# Patient Record
Sex: Female | Born: 1975
Health system: Southern US, Community
[De-identification: ages and names within clinical notes are randomized; demographics above are authoritative.]

## PROBLEM LIST (undated history)

## (undated) DIAGNOSIS — O99345 Other mental disorders complicating the puerperium: Secondary | ICD-10-CM

## (undated) DIAGNOSIS — H9191 Unspecified hearing loss, right ear: Secondary | ICD-10-CM

## (undated) DIAGNOSIS — R200 Anesthesia of skin: Secondary | ICD-10-CM

## (undated) DIAGNOSIS — O139 Gestational [pregnancy-induced] hypertension without significant proteinuria, unspecified trimester: Secondary | ICD-10-CM

## (undated) DIAGNOSIS — R87619 Unspecified abnormal cytological findings in specimens from cervix uteri: Secondary | ICD-10-CM

## (undated) DIAGNOSIS — Q999 Chromosomal abnormality, unspecified: Secondary | ICD-10-CM

## (undated) DIAGNOSIS — K812 Acute cholecystitis with chronic cholecystitis: Secondary | ICD-10-CM

## (undated) DIAGNOSIS — F53 Postpartum depression: Secondary | ICD-10-CM

## (undated) DIAGNOSIS — IMO0002 Reserved for concepts with insufficient information to code with codable children: Secondary | ICD-10-CM

## (undated) DIAGNOSIS — O3440 Maternal care for other abnormalities of cervix, unspecified trimester: Secondary | ICD-10-CM

## (undated) DIAGNOSIS — G039 Meningitis, unspecified: Secondary | ICD-10-CM

## (undated) DIAGNOSIS — Z9889 Other specified postprocedural states: Secondary | ICD-10-CM

## (undated) HISTORY — DX: Postpartum depression: F53.0

## (undated) HISTORY — DX: Unspecified hearing loss, right ear: H91.91

## (undated) HISTORY — PX: WISDOM TOOTH EXTRACTION: SHX21

## (undated) HISTORY — PX: CHOLECYSTECTOMY: SHX55

## (undated) HISTORY — DX: Other mental disorders complicating the puerperium: O99.345

## (undated) HISTORY — PX: DILATION AND CURETTAGE OF UTERUS: SHX78

---
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
## 2004-07-30 DIAGNOSIS — O3440 Maternal care for other abnormalities of cervix, unspecified trimester: Secondary | ICD-10-CM

## 2004-07-30 HISTORY — PX: CERVICAL BIOPSY  W/ LOOP ELECTRODE EXCISION: SUR135

## 2004-07-30 HISTORY — DX: Maternal care for other abnormalities of cervix, unspecified trimester: O34.40

## 2004-09-27 HISTORY — PX: COLPOSCOPY: SHX161

## 2007-09-10 ENCOUNTER — Ambulatory Visit (HOSPITAL_COMMUNITY): Admission: RE | Admit: 2007-09-10 | Discharge: 2007-09-10 | Payer: Self-pay | Admitting: Radiology

## 2007-09-10 ENCOUNTER — Encounter (INDEPENDENT_AMBULATORY_CARE_PROVIDER_SITE_OTHER): Payer: Self-pay | Admitting: Obstetrics and Gynecology

## 2008-02-28 ENCOUNTER — Encounter (INDEPENDENT_AMBULATORY_CARE_PROVIDER_SITE_OTHER): Payer: Self-pay | Admitting: Obstetrics and Gynecology

## 2008-02-28 ENCOUNTER — Ambulatory Visit (HOSPITAL_COMMUNITY): Admission: RE | Admit: 2008-02-28 | Discharge: 2008-02-28 | Payer: Self-pay | Admitting: Obstetrics and Gynecology

## 2009-01-13 ENCOUNTER — Inpatient Hospital Stay (HOSPITAL_COMMUNITY): Admission: AD | Admit: 2009-01-13 | Discharge: 2009-01-15 | Payer: Self-pay | Admitting: Obstetrics and Gynecology

## 2010-07-30 NOTE — L&D Delivery Note (Signed)
Delivery of 32 week stillborn  Patient was C/C/+2 and pushed for 2 minutes with epidural.   Breech NSVD  female infant, Apgars 0,0, weight P.   The patient had no lacerations. Fundus was firm. EBL was expected. Placenta was delivered intact. Vagina was clear.  Baby was transferred to warmer.  Phenotypic female, possible low set ears.  No other abnormalities seen.  Manan Olmo A

## 2010-08-24 ENCOUNTER — Ambulatory Visit (HOSPITAL_COMMUNITY)
Admission: RE | Admit: 2010-08-24 | Discharge: 2010-08-24 | Payer: Self-pay | Source: Home / Self Care | Attending: Obstetrics and Gynecology | Admitting: Obstetrics and Gynecology

## 2010-08-24 LAB — CBC
HCT: 35.6 % — ABNORMAL LOW (ref 36.0–46.0)
Hemoglobin: 12.5 g/dL (ref 12.0–15.0)
MCHC: 35.1 g/dL (ref 30.0–36.0)

## 2010-09-02 NOTE — Op Note (Signed)
  Teresa Wong, Teresa Wong                 ACCOUNT NO.:  [id number]  MEDICAL RECORD NO.:  [id number]          PATIENT TYPE:  AMB  LOCATION:  [place]                           FACILITY:  [facility]  PHYSICIAN:  Bobbye Charleston, M.D. DATE OF BIRTH:  10-07-1975  DATE OF PROCEDURE:  08/24/2010 DATE OF DISCHARGE:  08/24/2010                              OPERATIVE REPORT   PREOPERATIVE DIAGNOSIS:  Missed abortion.  POSTOPERATIVE DIAGNOSIS:  Missed abortion.  PROCEDURES:  Dilation and evacuation.  SURGEON:  Bobbye Charleston, M.D.  ASSISTANT:  None.  ANESTHESIA:  MAC anesthesia.  FINDINGS:  8-week size uterus down to 6 weeks with good crie.  SPECIMENS:  Uterine contents to Pathology.  BLOOD LOSS:  Minimal.  IV FLUIDS:  1000 mL.  URINE OUTPUT:  Not measured.  COMPLICATIONS:  None.  MEDICATIONS:  Methergine.  COUNTS:  Correct x3.  The patient's blood type was record at the office as O positive.  TECHNIQUE:  After adequate MAC anesthesia was achieved, the patient was prepped and draped in usual sterile fashion in dorsal lithotomy position.  Bladder was emptied with a red rubber catheters, speculum was placed in the vagina.  The single-tooth tenaculum was used to grasp the cervix, and the cervix was dilated with Kennon Rounds dilators.  An 8-mm curette was then passed into the uterine cavity and all uterine contents were suctioned out.  The sharp curettage indicated good crie.  The patient was given an IM shot of Methergine and all instruments were withdrawn from the vagina except the speculum.  There was a small amount of bleeding from the tenaculum site which was taken care of with silver nitrate.  The patient tolerated the procedure well and was returned to the recovery room in stable condition after the speculum had been removed.     Bobbye Charleston, M.D.     MH/MEDQ  D:  08/24/2010  T:  08/25/2010  Job:  [id number]  Electronically Signed by Bobbye Charleston MD on 09/02/2010 03:44:26  PM

## 2010-11-06 LAB — COMPREHENSIVE METABOLIC PANEL
ALT: 19 U/L (ref 0–35)
Albumin: 2.7 g/dL — ABNORMAL LOW (ref 3.5–5.2)
Alkaline Phosphatase: 98 U/L (ref 39–117)
BUN: 2 mg/dL — ABNORMAL LOW (ref 6–23)
BUN: 6 mg/dL (ref 6–23)
CO2: 20 mEq/L (ref 19–32)
Calcium: 8.9 mg/dL (ref 8.4–10.5)
Chloride: 104 mEq/L (ref 96–112)
Creatinine, Ser: 0.53 mg/dL (ref 0.4–1.2)
GFR calc Af Amer: >60 mL/min (ref >60)
GFR calc non Af Amer: >60 mL/min (ref >60)
Glucose, Bld: 81 mg/dL (ref 70–99)
Glucose, Bld: 85 mg/dL (ref 70–99)
Potassium: 3.8 mEq/L (ref 3.5–5.1)
Sodium: 137 mEq/L (ref 135–145)
Total Bilirubin: 0.5 mg/dL (ref 0.3–1.2)

## 2010-11-06 LAB — CBC
HCT: 32.9 % — ABNORMAL LOW (ref 36.0–46.0)
Hemoglobin: 11.7 g/dL — ABNORMAL LOW (ref 12.0–15.0)
Hemoglobin: 14.3 g/dL (ref 12.0–15.0)
MCHC: 35.1 g/dL (ref 30.0–36.0)
MCV: 93.5 fL (ref 78.0–100.0)
Platelets: 184 10*3/uL (ref 150–400)
RBC: 4.37 MIL/uL (ref 3.87–5.11)
WBC: 10.7 10*3/uL — ABNORMAL HIGH (ref 4.0–10.5)
WBC: 12.5 10*3/uL — ABNORMAL HIGH (ref 4.0–10.5)

## 2010-11-06 LAB — RPR: RPR Ser Ql: NONREACTIVE

## 2010-11-06 LAB — LACTATE DEHYDROGENASE: LDH: 150 U/L (ref 94–250)

## 2010-11-06 LAB — URIC ACID: Uric Acid, Serum: 3.6 mg/dL (ref 2.4–7.0)

## 2010-12-12 NOTE — Discharge Summary (Signed)
Teresa Wong, Teresa Wong                 ACCOUNT NO.:  [id number]   MEDICAL RECORD NO.:  [id number]          PATIENT TYPE:  INP   LOCATION:  9145                          FACILITY:  [facility]   PHYSICIAN:  Cristopher Estimable. Stann Mainland, M.D.   DATE OF BIRTH:  1976-06-09   DATE OF ADMISSION:  01/13/2009  DATE OF DISCHARGE:  01/15/2009                               DISCHARGE SUMMARY   DISCHARGE DIAGNOSES:  1. Term pregnancy delivered 5 pound 12 ounce female infant, Apgars 8      and 9.  2. Blood type O+.  3. Mild preeclampsia.   PROCEDURES:  1. Vacuum extraction-assisted delivery.  2. Second-degree midline laceration repair.   SUMMARY:  This 35 year old gravida 3, now para 1, aborta 2 was admitted  early active labor at [redacted] weeks gestation.  She was felt to have mild  preeclampsia.  She progressed and on the morning of June 17 underwent  amniotomy with production of clear fluid at that time.  She was 7 cm  dilated.  Her blood pressure was stable and she subsequently early in  the afternoon on June 17 underwent a vacuum extraction-assisted delivery  with delivery of a 5 pound 12 ounce female infant with Apgars of 8 and 9  of her second-degree tear which was repaired without difficulty.  On the  first postpartum day, she was normotensive and her labs were normal.  On  the morning of June 19 getting ready for discharge.  Her blood pressure  was noted to be 149/94.   Labs were repeated which included a CBC and a comprehensive metabolic  profile and they were normal.  She was given 200 mg labetalol p.o. and  an hour and half later her blood pressure was 124/84.   She was discharged to home with appropriate instructions.   MEDICATIONS:  At time of discharge include, labetalol 200 mg twice daily  for a total of 7 days, she will use Advil 2 every 4 hours as needed for  cramping and mild discomfort, and she was given a prescription for Tylox  which she will use 1-2 every 4-6 hours as needed for more severe pain.  She will return to the office and follow up in approximately 4 weeks'  time or as needed.  She was given an instruction sheet at the time of  discharge and understood all instructions well.   CONDITION ON DISCHARGE:  Improved.      Cristopher Estimable. Stann Mainland, M.D.  Electronically Signed     RMW/MEDQ  D:  01/15/2009  T:  01/15/2009  Job:  [id number]

## 2010-12-12 NOTE — Op Note (Signed)
Teresa Wong, Teresa Wong                 ACCOUNT NO.:  [id number]   MEDICAL RECORD NO.:  [id number]          PATIENT TYPE:  AMB   LOCATION:  [place]                           FACILITY:  [facility]   PHYSICIAN:  Bobbye Charleston, M.D. DATE OF BIRTH:  12/26/1975   DATE OF PROCEDURE:  02/28/2008  DATE OF DISCHARGE:                               OPERATIVE REPORT   PREOPERATIVE DIAGNOSIS:  Missed abortion.   POSTOPERATIVE DIAGNOSIS:  Missed abortion.   PROCEDURES:  Dilation and evacuation.   SURGEON:  Bobbye Charleston, MD.   ASSISTANT:  None.   ANESTHESIA:  MAC.   FINDINGS:  A 9-week size down to 7-week size post operation with good  crie.   SPECIMENS:  Uterine contents to pathology.   BLOOD LOSS:  100 mL.   IV FLUIDS:  1000 mL.   URINE OUTPUT:  Not measured.   MEDICATIONS:  Methergine.   Counts were correct x3.   TECHNIQUE:  After adequate MAC anesthesia was achieved, she was prepped  and draped in usual sterile fashion in dorsal lithotomy position.  The  bladder was emptied with a red rubber catheters.  A speculum was placed  in the vagina.  Cervix was grasped with a single-tooth tenaculum and the  cervix dilated with Teresa Wong dilators.  A 9 mm suction curette was passed  into the uterus and suction curettage was performed.  Alternating  suction curettage and sharp curettage was performed to ensure removal of  all contents.  Once good crie was obtained, all instruments drawn from  the vagina and the patient was returned to recovery room in stable  condition.     Bobbye Charleston, M.D.  Electronically Signed    MH/MEDQ  D:  02/28/2008  T:  02/28/2008  Job:  [id number]

## 2010-12-15 NOTE — Op Note (Signed)
NAMEMURLE, HELLSTROM                 ACCOUNT NO.:  [id number]   MEDICAL RECORD NO.:  [id number]          PATIENT TYPE:  AMB   LOCATION:  [place]                           FACILITY:  [facility]   PHYSICIAN:  Bobbye Charleston, M.D. DATE OF BIRTH:  [birth year]   DATE OF PROCEDURE:  09/11/2007  DATE OF DISCHARGE:                               OPERATIVE REPORT   PREOPERATIVE DIAGNOSIS:  Missed abortion.   POSTOPERATIVE DIAGNOSIS:  Missed abortion.   PROCEDURE:  Dilation and evacuation.   SURGEON:  Dr. Philis Pique.   ASSISTANT:  None.   ANESTHESIA:  MAC.   FINDINGS:  A 9-10 week size uterus down to 7 week post procedure with  good crie.   SPECIMENS:  Uterine contents to pathology.   ESTIMATED BLOOD LOSS:  100 mL.   IV FLUIDS:  800 mL.   URINE OUTPUT:  Not measured.   COMPLICATIONS:  None.   COUNTS:  Correct x3.   MEDICATIONS:  Methergine.   TECHNIQUE:  After adequate MAC anesthesia was achieved, the patient was  prepped and draped in the usual sterile fashion in the dorsal lithotomy  position.  The bladder was emptied with a red rubber catheter and  speculum placed in the vagina. The cervix was grasped with a single-  tooth tenaculum and the cervix dilated with Kennon Rounds dilators.  A 10 mm suction curette was placed inside the uterus and suction  curettage performed. Alternating suction curettage with sharp curettage  was performed until good crie was obtained. A dose of Methergine was  given. All instruments withdrawn from the vagina.  The patient returned  to the recovery room in stable condition.      Bobbye Charleston, M.D.  Electronically Signed     MH/MEDQ  D:  09/10/2007  T:  09/11/2007  Job:  [id number]

## 2011-04-20 LAB — ABO/RH: ABO/RH(D): O POS

## 2011-04-20 LAB — CBC
MCHC: 35.3
Platelets: 352
RBC: 4.34
WBC: 7.2

## 2011-04-25 ENCOUNTER — Encounter (HOSPITAL_COMMUNITY): Payer: Self-pay | Admitting: Anesthesiology

## 2011-04-25 ENCOUNTER — Encounter (HOSPITAL_COMMUNITY): Payer: Self-pay | Admitting: *Deleted

## 2011-04-25 ENCOUNTER — Inpatient Hospital Stay (HOSPITAL_COMMUNITY): Payer: 59 | Admitting: Anesthesiology

## 2011-04-25 ENCOUNTER — Encounter (HOSPITAL_COMMUNITY): Payer: Self-pay

## 2011-04-25 ENCOUNTER — Inpatient Hospital Stay (HOSPITAL_COMMUNITY)
Admission: AD | Admit: 2011-04-25 | Discharge: 2011-04-26 | DRG: 775 | Disposition: A | Discharge status: Home / Self Care | Payer: 59 | Source: Ambulatory Visit | Attending: Obstetrics and Gynecology | Admitting: Obstetrics and Gynecology

## 2011-04-25 DIAGNOSIS — O321XX0 Maternal care for breech presentation, not applicable or unspecified: Secondary | ICD-10-CM | POA: Diagnosis present

## 2011-04-25 DIAGNOSIS — O364XX0 Maternal care for intrauterine death, not applicable or unspecified: Principal | ICD-10-CM | POA: Diagnosis present

## 2011-04-25 HISTORY — DX: Gestational (pregnancy-induced) hypertension without significant proteinuria, unspecified trimester: O13.9

## 2011-04-25 HISTORY — DX: Reserved for concepts with insufficient information to code with codable children: IMO0002

## 2011-04-25 HISTORY — DX: Other specified postprocedural states: Z98.890

## 2011-04-25 HISTORY — DX: Unspecified abnormal cytological findings in specimens from cervix uteri: R87.619

## 2011-04-25 HISTORY — DX: Meningitis, unspecified: G03.9

## 2011-04-25 HISTORY — DX: Maternal care for other abnormalities of cervix, unspecified trimester: O34.40

## 2011-04-25 LAB — URIC ACID: Uric Acid, Serum: 3.6 mg/dL (ref 2.4–7.0)

## 2011-04-25 LAB — RPR
RPR: NONREACTIVE
RPR: NONREACTIVE

## 2011-04-25 LAB — CBC
HCT: 39.3 % (ref 36.0–46.0)
Hemoglobin: 13.9 g/dL (ref 12.0–15.0)
MCV: 89.9 fL (ref 78.0–100.0)
RBC: 4.37 MIL/uL (ref 3.87–5.11)
WBC: 8.9 10*3/uL (ref 4.0–10.5)

## 2011-04-25 LAB — COMPREHENSIVE METABOLIC PANEL
ALT: 10 U/L (ref 0–35)
AST: 12 U/L (ref 0–37)
Albumin: 2.8 g/dL — ABNORMAL LOW (ref 3.5–5.2)
CO2: 24 mEq/L (ref 19–32)
Calcium: 9 mg/dL (ref 8.4–10.5)
Chloride: 103 mEq/L (ref 96–112)
Creatinine, Ser: 0.51 mg/dL (ref 0.50–1.10)
GFR calc non Af Amer: >60 mL/min (ref >60)
Sodium: 137 mEq/L (ref 135–145)
Total Bilirubin: 0.2 mg/dL — ABNORMAL LOW (ref 0.3–1.2)

## 2011-04-25 LAB — HEPATITIS B SURFACE ANTIGEN: Hepatitis B Surface Ag: NEGATIVE

## 2011-04-25 LAB — HIV ANTIBODY (ROUTINE TESTING W REFLEX): HIV: NONREACTIVE

## 2011-04-25 LAB — LACTIC ACID, PLASMA: Lactic Acid, Venous: 1.4 mmol/L (ref 0.5–2.2)

## 2011-04-25 MED ORDER — PHENYLEPHRINE 40 MCG/ML (10ML) SYRINGE FOR IV PUSH (FOR BLOOD PRESSURE SUPPORT)
80.0000 ug | PREFILLED_SYRINGE | INTRAVENOUS | Status: DC | PRN
  Filled 2011-04-25: qty 5

## 2011-04-25 MED ORDER — EPHEDRINE 5 MG/ML INJ: 10.0000 mg | INTRAVENOUS | Status: DC | PRN

## 2011-04-25 MED ORDER — DIPHENHYDRAMINE HCL 50 MG/ML IJ SOLN: 12.5000 mg | INTRAMUSCULAR | Status: DC | PRN

## 2011-04-25 MED ORDER — OXYTOCIN 20 UNITS IN LACTATED RINGERS INFUSION - SIMPLE
125.0000 mL/h | Freq: Once | INTRAVENOUS | Status: DC
  Filled 2011-04-25: qty 1000

## 2011-04-25 MED ORDER — CITRIC ACID-SODIUM CITRATE 334-500 MG/5ML PO SOLN: 30.0000 mL | ORAL | Status: DC | PRN

## 2011-04-25 MED ORDER — OXYTOCIN BOLUS FROM INFUSION
500.0000 mL | Freq: Once | INTRAVENOUS | Status: DC
  Filled 2011-04-25: qty 1000
  Filled 2011-04-25: qty 500

## 2011-04-25 MED ORDER — PHENYLEPHRINE 40 MCG/ML (10ML) SYRINGE FOR IV PUSH (FOR BLOOD PRESSURE SUPPORT): 80.0000 ug | PREFILLED_SYRINGE | INTRAVENOUS | Status: DC | PRN

## 2011-04-25 MED ORDER — LIDOCAINE HCL (PF) 1 % IJ SOLN
30.0000 mL | INTRAMUSCULAR | Status: DC | PRN
  Filled 2011-04-25: qty 30

## 2011-04-25 MED ORDER — EPHEDRINE 5 MG/ML INJ
10.0000 mg | INTRAVENOUS | Status: DC | PRN
  Filled 2011-04-25: qty 4

## 2011-04-25 MED ORDER — LIDOCAINE HCL 1.5 % IJ SOLN: INTRAMUSCULAR | Status: DC | PRN

## 2011-04-25 MED ORDER — MISOPROSTOL 25 MCG QUARTER TABLET
25.0000 ug | ORAL_TABLET | ORAL | Status: DC | PRN
  Filled 2011-04-25: qty 0.25

## 2011-04-25 MED ORDER — LACTATED RINGERS IV SOLN: 500.0000 mL | Freq: Once | INTRAVENOUS | Status: DC

## 2011-04-25 MED ORDER — ONDANSETRON HCL 4 MG/2ML IJ SOLN: 4.0000 mg | Freq: Four times a day (QID) | INTRAMUSCULAR | Status: DC | PRN

## 2011-04-25 MED ORDER — IBUPROFEN 600 MG PO TABS
600.0000 mg | ORAL_TABLET | Freq: Four times a day (QID) | ORAL | Status: DC | PRN
  Administered 2011-04-26: 600 mg via ORAL
  Filled 2011-04-25: qty 1

## 2011-04-25 MED ORDER — ACETAMINOPHEN 325 MG PO TABS: 650.0000 mg | ORAL_TABLET | ORAL | Status: DC | PRN

## 2011-04-25 MED ORDER — LIDOCAINE HCL 1.5 % IJ SOLN
INTRAMUSCULAR | Status: DC | PRN
  Administered 2011-04-25 (×2): 5 mL via EPIDURAL

## 2011-04-25 MED ORDER — FENTANYL 2.5 MCG/ML BUPIVACAINE 1/10 % EPIDURAL INFUSION (WH - ANES)
14.0000 mL/h | INTRAMUSCULAR | Status: DC
  Administered 2011-04-25 – 2011-04-26 (×4): 14 mL/h via EPIDURAL
  Filled 2011-04-25 (×6): qty 60

## 2011-04-25 MED ORDER — BUTORPHANOL TARTRATE 2 MG/ML IJ SOLN: 1.0000 mg | INTRAMUSCULAR | Status: DC | PRN

## 2011-04-25 MED ORDER — FENTANYL 2.5 MCG/ML BUPIVACAINE 1/10 % EPIDURAL INFUSION (WH - ANES)
INTRAMUSCULAR | Status: DC | PRN
  Administered 2011-04-25: 14 mL/h via EPIDURAL

## 2011-04-25 MED ORDER — LACTATED RINGERS IV SOLN: 500.0000 mL | INTRAVENOUS | Status: DC | PRN

## 2011-04-25 MED ORDER — LACTATED RINGERS IV SOLN
INTRAVENOUS | Status: DC
  Administered 2011-04-25 – 2011-04-26 (×3): via INTRAVENOUS

## 2011-04-25 MED ORDER — OXYCODONE-ACETAMINOPHEN 5-325 MG PO TABS: 2.0000 | ORAL_TABLET | ORAL | Status: DC | PRN

## 2011-04-25 MED ORDER — OXYTOCIN 20 UNITS IN LACTATED RINGERS INFUSION - SIMPLE
1.0000 m[IU]/min | INTRAVENOUS | Status: DC
  Administered 2011-04-25: 2 m[IU]/min via INTRAVENOUS
  Administered 2011-04-25: 16 m[IU]/min via INTRAVENOUS

## 2011-04-25 MED ORDER — FLEET ENEMA 7-19 GM/118ML RE ENEM: 1.0000 | ENEMA | RECTAL | Status: DC | PRN

## 2011-04-25 NOTE — Anesthesia Preprocedure Evaluation (Signed)
Anesthesia Evaluation  Name, MR# and DOB Patient awake  General Assessment Comment  Reviewed: Allergy & Precautions, H&P , NPO status , Patient's Chart, lab work & pertinent test results  Airway Mallampati: II TM Distance: >3 FB Neck ROM: full    Dental No notable dental hx.    Pulmonary  clear to auscultation  pulmonary exam normalPulmonary Exam Normal breath sounds clear to auscultation none    Cardiovascular     Neuro/Psych Negative Neurological ROS  Negative Psych ROS  GI/Hepatic/Renal negative GI ROS  negative Liver ROS  negative Renal ROS        Endo/Other  Negative Endocrine ROS (+)      Abdominal Normal abdominal exam  (+)   Musculoskeletal   Hematology negative hematology ROS (+)   Peds  Reproductive/Obstetrics (+) Pregnancy    Anesthesia Other Findings             Anesthesia Physical Anesthesia Plan  ASA: II  Anesthesia Plan: Epidural   Post-op Pain Management:    Induction:   Airway Management Planned:   Additional Equipment:   Intra-op Plan:   Post-operative Plan:   Informed Consent: I have reviewed the patients History and Physical, chart, labs and discussed the procedure including the risks, benefits and alternatives for the proposed anesthesia with the patient or authorized representative who has indicated his/her understanding and acceptance.     Plan Discussed with:   Anesthesia Plan Comments:         Anesthesia Quick Evaluation

## 2011-04-25 NOTE — Anesthesia Procedure Notes (Signed)
Epidural Patient location during procedure: OB Start time: 04/25/2011 11:25 AM End time: 04/25/2011 11:31 AM Reason for block: procedure for pain  Staffing Anesthesiologist: Laymond Purser Performed by: anesthesiologist   Preanesthetic Checklist Completed: patient identified, site marked, surgical consent, pre-op evaluation, timeout performed, IV checked, risks and benefits discussed and monitors and equipment checked  Epidural Patient position: sitting Prep: site prepped and draped and DuraPrep Patient monitoring: continuous pulse ox and blood pressure Approach: midline Injection technique: LOR air  Needle:  Needle type: Tuohy  Needle gauge: 17 G Needle length: 9 cm Needle insertion depth: 6 cm Catheter type: closed end flexible Catheter size: 19 Gauge Catheter at skin depth: 11 cm Test dose: negative and 1.5% lidocaine  Assessment Sensory level: T10 Events: blood not aspirated, injection not painful, no injection resistance, negative IV test and no paresthesia

## 2011-04-25 NOTE — Progress Notes (Signed)
Spiritual Care - Visited with patient to offer support and to relay concern from her co-workers at [employer] - she was very touched by their concern.  Her father was with her and was very pleasant and providing good support.  I explained availability of chaplaincy services as needed.  Adella Hare, Chaplain

## 2011-04-25 NOTE — Plan of Care (Signed)
Continued support and providing information about intrauterine loss. Pt verbalizes understanding and has no questions at this time. Has decided against autopsy.

## 2011-04-25 NOTE — Progress Notes (Signed)
Discussing with patient comfort care, autopsy options, and after delivery preferences. Pt tearful and held by husband.

## 2011-04-25 NOTE — H&P (Signed)
35 y.o.34 y.o. found to have a a 32 week demise with hydrops.  Unknown reason.  Pt had been followed closely for [facility] for the last 3 weeks.  [place] labs about 2 weeks ago had been normal and she never had proteinuria.      Past Medical History  Diagnosis Date  . Abnormal Pap smear   . History of LEEP (loop electrosurgical excision procedure) of cervix complicating pregnancy 7915  . Pregnancy induced hypertension   . Meningitis 1984    Past Surgical History  Procedure Date  . Dilation and curettage of uterus     OB History    Grav Para Term Preterm Abortions TAB SAB Ect Mult Living   5 1 1  0 3 0 3 0 0 1     # Outc Date GA Lbr Len/2nd Wgt Sex Del Anes PTL Lv   1 TRM            2 SAB            3 SAB            4 SAB            5 CUR               History   Social History  . Marital Status: Married    Spouse Name: N/A    Number of Children: N/A  . Years of Education: N/A   Occupational History  . Not on file.   Social History Main Topics  . Smoking status: Not on file  . Smokeless tobacco: Not on file  . Alcohol Use: No  . Drug Use: No  . Sexually Active: No   Other Topics Concern  . Not on file   Social History Narrative  . No narrative on file   Review of patient's allergies indicates no known allergies.   Prenatal Course:  Uncomplicated except for PIH and now stillbirth.  No chromosomal prenatal testing was done.    Filed Vitals:   04/25/11 1201  BP: 131/85  Pulse: 111  Temp:   Resp: 18     Lungs/Cor:  NAD Abdomen:  soft, gravid Ex:  no cords, erythema SVE:  2/60/-2, breech (confirmed by u/s) FHTs:  none Toco:  none  Lab Results  Component Value Date   WBC 8.9 04/25/2011   HGB 13.9 04/25/2011   HCT 39.3 04/25/2011   MCV 89.9 04/25/2011   PLT 252 04/25/2011     A/P   32 weeks still birth.  All options and risks d/w patient.  Desires to proceed with induction and chromosomal testing.  Declines autopsy at this point.   Sayeed Weatherall A

## 2011-04-26 ENCOUNTER — Other Ambulatory Visit: Payer: Self-pay | Admitting: Obstetrics and Gynecology

## 2011-04-26 ENCOUNTER — Encounter (HOSPITAL_COMMUNITY): Payer: Self-pay | Admitting: *Deleted

## 2011-04-26 LAB — RUBELLA ANTIBODY, IGM: Rubella: IMMUNE

## 2011-04-26 MED ORDER — LORAZEPAM 0.5 MG PO TABS: 0.5000 mg | ORAL_TABLET | Freq: Three times a day (TID) | ORAL | Status: AC

## 2011-04-26 NOTE — Progress Notes (Signed)
Spiritual Care - Visited with patient and her husband to provide support.  Both grieving heavily and very tearful. They have good support from family and friends. Planning for cremation and a private memorial for baby.  Briefly discussed Comfort grief support information.  Adella Hare, Chaplain

## 2011-04-26 NOTE — Discharge Summary (Signed)
Obstetric Discharge Summary Reason for Admission: induction of labor for 32 week IUFD Prenatal Procedures: ultrasound Intrapartum Procedures: spontaneous vaginal delivery Postpartum Procedures: none Complications-Operative and Postpartum: none Hemoglobin  Date Value Range Status  04/25/2011 13.9  12.0-15.0 (g/dL) Final     HCT  Date Value Range Status  04/25/2011 39.3  36.0-46.0 (%) Final    Discharge Diagnoses: IUFD at 50 weeks, s/p SVD  Discharge Information: Date: 04/26/2011 Activity: pelvic rest Diet: routine Medications: Ativan, Ibuprofen Condition: stable Instructions: Give instructions for IUFD, give info on Heartstrings Discharge to: home Follow-up Information    Make an appointment with Kharisma Glasner A.   Contact information:   [place] [phone]         O+/RI Newborn Data: Live born female  Birth Weight: 3 lb 12.7 oz (1720 g) APGAR: 0, 0  Home with to funeral home; chromosomes sent, autopsy declined.Marland Kitchen  Jerid Catherman A 04/26/2011, 9:36 AM

## 2011-04-26 NOTE — H&P (Addendum)
Delivery of 32 week stillborn  Patient was C/C/+2 and pushed for 2 minutes with epidural.   Breech NSVD  female infant, Apgars 0,0, weight P.   The patient had no lacerations. Fundus was firm. EBL was expected. Placenta was delivered intact. Vagina was clear.  Baby was transferred to warmer.  Phenotypic female, possible low set ears.  No other abnormalities seen.  [place] labs were normal.  Teresa Wong A

## 2011-04-26 NOTE — Anesthesia Postprocedure Evaluation (Signed)
Anesthesia Post Note  Patient: Teresa Wong  Procedure(s) Performed: * No procedures listed *  Anesthesia type: Epidural  Patient location: L+D  Post pain: Pain level controlled  Post assessment: Post-op Vital signs reviewed  Last Vitals:  Filed Vitals:   04/26/11 0547  BP: 158/91  Pulse: 102  Temp:   Resp: 18    Post vital signs: Reviewed  Level of consciousness: awake  Complications: No apparent anesthesia complications

## 2011-04-27 LAB — CBC
MCHC: 33.8
RDW: 13.1

## 2011-09-14 ENCOUNTER — Encounter (HOSPITAL_COMMUNITY): Payer: Self-pay | Admitting: Maternal and Fetal Medicine

## 2011-09-25 ENCOUNTER — Ambulatory Visit (HOSPITAL_COMMUNITY): Payer: 59

## 2011-09-28 ENCOUNTER — Ambulatory Visit (HOSPITAL_COMMUNITY)
Admission: RE | Admit: 2011-09-28 | Discharge: 2011-09-28 | Disposition: A | Discharge status: Home / Self Care | Payer: 59 | Source: Ambulatory Visit | Attending: Obstetrics and Gynecology | Admitting: Obstetrics and Gynecology

## 2011-10-02 DIAGNOSIS — N96 Recurrent pregnancy loss: Secondary | ICD-10-CM | POA: Insufficient documentation

## 2011-10-02 NOTE — Progress Notes (Addendum)
Genetic Counseling  Preconception Note  Appointment Date:  09/28/2011 Referred By: Daria Pastures, MD Date of Birth:  Sep 15, 1975 Partner:  Teresa Wong  Pregnancy History: [id number] Attending: Renella Cunas, MD   Teresa Wong and her husband, Teresa Wong, were seen for genetic counseling because of recurrent miscarriage and a previous stillbirth. The couple was also seen for maternal-fetal medicine consultation at the time of today's visit given this history.   Both family histories were reviewed and were contributory for a history of three first trimester miscarriages (approximately 10 weeks) and a 33 week intrauterine fetal demise at [redacted] weeks gestation. The couple also has a healthy daughter (their third pregnancy). Chromosome analysis was attempted on products of conception (POC) from the couple's most recent loss, and karyotype was not able to be obtained. FISH for aneuploidy was performed on POCs and was normal. 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 The underlying cause is not known, and she is otherwise healthy. Teresa Wong reportedly has a diagnosis of epilepsy, underlying cause unknown. Teresa Wong also reported a paternal aunt who had a stillborn daughter at term. An underlying cause was not known, and no birth defects were reported. Additionally, Teresa Wong reported that his maternal grandparents had a history of 3-4 first trimester pregnancy losses. An underlying cause is not known. Teresa Wong also reported a nephew, his brother's son, with giant congenital nevi, described to be on his back and in his brain causing seizures at birth.   We discussed that approximately 1 in 6 confirmed pregnancies results in miscarriage. A single underlying cause is more likely to be suspected when a couple has experienced 3 or more losses. We reviewed several possible causes including chromosome rearrangements,  antibodies, thrombophilia, and structural differences in the uterus. We also discussed that approximately 50% to 75% of couple with recurrent pregnancy loss do not have an identified cause. We discussed that we are unable to assess whether or not the family history of stillbirth and miscarriages for extended relatives could be related to the couple's history of pregnancy loss.   We reviewed chromosomes and examples of chromosome conditions. In approximately 3-8% of couples with recurrent pregnancy loss, one partner carries a chromosome variant, such as a balanced translocation. Being a carrier of a chromosome variant can increase the risk for abnormalities in the sperm or egg cell, which can increase the risk for miscarriage or the birth of a child with birth defects and/or mental retardation. We also discussed that miscarriage can occur due to extra or missing chromosome conditions, which can occur sporadically. We discussed that maternal age related association with nondisjunction and trisomy conditions.  [name] previously had peripheral blood chromosome analysis performed which revealed apparently normal chromosomes for each.   We reviewed that inherited predisposition to clotting can also increase the risk for miscarriage given the association with increased risk for disrupted blood flow in the pregnancy. Additionally, the presence of certain antibodies have been associated with an increased risk for miscarriage. Teresa Wong previously had a normal thrombophilia workup through her OB office. Additionally, we discussed that structural differences in the uterus can be an underlying cause for pregnancy loss which can be evaluated by sonohysterography. Please see Dr. Hurley Wong consult note for additional discussion regarding the couple's history.   Regarding the additional family history, we discussed Teresa Wong congenital deafness in her right ear. Hearing  loss can have many causes including genetic  factors, environmental factors or a combination of both.  Sometimes hearing loss can occur as one feature of an underlying genetic condition or may be caused by a single nonworking gene. Additional information regarding a cause for their hearing loss is needed in order to most accurately assess the chance for their children. It may be helpful for the couple to inform their pediatrician of this history so that their child(ren) can be screened appropriately. Giant congenial nevi typically occur sporadically. This reported history would not be expected to increase recurrence risk for the couple's children and would not be expected to be related to an increased risk for miscarriage for relatives. Without further information regarding the provided family history, an accurate genetic risk cannot be calculated. Further genetic counseling is warranted if more information is obtained.  Teresa Wong denied exposure to environmental toxins or chemical agents. She denied the use of alcohol, tobacco or street drugs.   I counseled this couple regarding the above risks and available options.  The approximate face-to-face time with the genetic counselor was 25 minutes.  Teresa Oman, MS,  Certified Genetic Counselor 10/02/2011

## 2011-12-14 ENCOUNTER — Encounter (HOSPITAL_COMMUNITY): Payer: Self-pay | Admitting: Pharmacist

## 2011-12-16 ENCOUNTER — Other Ambulatory Visit: Payer: Self-pay | Admitting: Obstetrics and Gynecology

## 2011-12-18 ENCOUNTER — Encounter (HOSPITAL_COMMUNITY): Payer: Self-pay | Admitting: Anesthesiology

## 2011-12-18 ENCOUNTER — Ambulatory Visit (HOSPITAL_COMMUNITY): Payer: 59 | Admitting: Anesthesiology

## 2011-12-18 ENCOUNTER — Ambulatory Visit (HOSPITAL_COMMUNITY)
Admission: RE | Admit: 2011-12-18 | Discharge: 2011-12-18 | Disposition: A | Discharge status: Home / Self Care | Payer: 59 | Source: Ambulatory Visit | Attending: Obstetrics and Gynecology | Admitting: Obstetrics and Gynecology

## 2011-12-18 ENCOUNTER — Encounter (HOSPITAL_COMMUNITY)
Admission: RE | Disposition: A | Discharge status: Home / Self Care | Payer: Self-pay | Source: Ambulatory Visit | Attending: Obstetrics and Gynecology

## 2011-12-18 ENCOUNTER — Encounter (HOSPITAL_COMMUNITY): Payer: Self-pay | Admitting: *Deleted

## 2011-12-18 DIAGNOSIS — O021 Missed abortion: Secondary | ICD-10-CM | POA: Insufficient documentation

## 2011-12-18 DIAGNOSIS — N96 Recurrent pregnancy loss: Secondary | ICD-10-CM

## 2011-12-18 HISTORY — PX: DILATION AND EVACUATION: SHX1459

## 2011-12-18 LAB — CBC
HCT: 37.6 % (ref 36.0–46.0)
Hemoglobin: 13.1 g/dL (ref 12.0–15.0)
MCHC: 34.8 g/dL (ref 30.0–36.0)
MCV: 88.9 fL (ref 78.0–100.0)
RDW: 13.1 % (ref 11.5–15.5)

## 2011-12-18 SURGERY — DILATION AND EVACUATION, UTERUS
Anesthesia: General | Site: Uterus | Wound class: Clean Contaminated

## 2011-12-18 MED ORDER — MIDAZOLAM HCL 2 MG/2ML IJ SOLN
INTRAMUSCULAR | Status: AC
  Filled 2011-12-18: qty 2

## 2011-12-18 MED ORDER — MIDAZOLAM HCL 5 MG/5ML IJ SOLN
INTRAMUSCULAR | Status: DC | PRN
  Administered 2011-12-18: 2 mg via INTRAVENOUS

## 2011-12-18 MED ORDER — ONDANSETRON HCL 4 MG/2ML IJ SOLN
INTRAMUSCULAR | Status: AC
  Filled 2011-12-18: qty 2

## 2011-12-18 MED ORDER — LIDOCAINE HCL (CARDIAC) 20 MG/ML IV SOLN
INTRAVENOUS | Status: AC
  Filled 2011-12-18: qty 5

## 2011-12-18 MED ORDER — PROPOFOL 10 MG/ML IV EMUL
INTRAVENOUS | Status: AC
  Filled 2011-12-18: qty 20

## 2011-12-18 MED ORDER — FENTANYL CITRATE 0.05 MG/ML IJ SOLN
INTRAMUSCULAR | Status: AC
  Filled 2011-12-18: qty 2

## 2011-12-18 MED ORDER — LACTATED RINGERS IV SOLN
INTRAVENOUS | Status: DC
  Administered 2011-12-18 (×2): via INTRAVENOUS

## 2011-12-18 MED ORDER — KETOROLAC TROMETHAMINE 30 MG/ML IJ SOLN
INTRAMUSCULAR | Status: DC | PRN
  Administered 2011-12-18: 30 mg via INTRAVENOUS

## 2011-12-18 MED ORDER — KETOROLAC TROMETHAMINE 30 MG/ML IJ SOLN: 15.0000 mg | Freq: Once | INTRAMUSCULAR | Status: DC | PRN

## 2011-12-18 MED ORDER — PROPOFOL 10 MG/ML IV EMUL
INTRAVENOUS | Status: DC | PRN
  Administered 2011-12-18: 200 mg via INTRAVENOUS

## 2011-12-18 MED ORDER — FENTANYL CITRATE 0.05 MG/ML IJ SOLN
INTRAMUSCULAR | Status: DC | PRN
  Administered 2011-12-18 (×2): 50 ug via INTRAVENOUS

## 2011-12-18 MED ORDER — ONDANSETRON HCL 4 MG/2ML IJ SOLN
INTRAMUSCULAR | Status: DC | PRN
  Administered 2011-12-18: 4 mg via INTRAVENOUS

## 2011-12-18 MED ORDER — METHYLERGONOVINE MALEATE 0.2 MG/ML IJ SOLN
INTRAMUSCULAR | Status: AC
  Filled 2011-12-18: qty 1

## 2011-12-18 MED ORDER — LIDOCAINE HCL (CARDIAC) 20 MG/ML IV SOLN
INTRAVENOUS | Status: DC | PRN
  Administered 2011-12-18: 50 mg via INTRAVENOUS

## 2011-12-18 MED ORDER — FENTANYL CITRATE 0.05 MG/ML IJ SOLN: 25.0000 ug | INTRAMUSCULAR | Status: DC | PRN

## 2011-12-18 MED ORDER — KETOROLAC TROMETHAMINE 30 MG/ML IJ SOLN
INTRAMUSCULAR | Status: AC
  Filled 2011-12-18: qty 1

## 2011-12-18 MED ORDER — METHYLERGONOVINE MALEATE 0.2 MG/ML IJ SOLN
INTRAMUSCULAR | Status: DC | PRN
  Administered 2011-12-18: 0.2 mg via INTRAMUSCULAR

## 2011-12-18 SURGICAL SUPPLY — 15 items
CATH ROBINSON RED A/P 16FR (CATHETERS) ×2 IMPLANT
CLOTH BEACON ORANGE TIMEOUT ST (SAFETY) ×2 IMPLANT
DECANTER SPIKE VIAL GLASS SM (MISCELLANEOUS) ×2 IMPLANT
GLOVE BIO SURGEON STRL SZ7 (GLOVE) ×4 IMPLANT
GOWN PREVENTION PLUS LG XLONG (DISPOSABLE) ×2 IMPLANT
KIT BERKELEY 1ST TRIMESTER 3/8 (MISCELLANEOUS) ×2 IMPLANT
NS IRRIG 1000ML POUR BTL (IV SOLUTION) ×2 IMPLANT
PACK VAGINAL MINOR WOMEN LF (CUSTOM PROCEDURE TRAY) ×2 IMPLANT
PAD PREP 24X48 CUFFED NSTRL (MISCELLANEOUS) ×2 IMPLANT
SET BERKELEY SUCTION TUBING (SUCTIONS) ×2 IMPLANT
TOWEL OR 17X24 6PK STRL BLUE (TOWEL DISPOSABLE) ×4 IMPLANT
VACURETTE 10 RIGID CVD (CANNULA) IMPLANT
VACURETTE 7MM CVD STRL WRAP (CANNULA) IMPLANT
VACURETTE 8 RIGID CVD (CANNULA) IMPLANT
VACURETTE 9 RIGID CVD (CANNULA) IMPLANT

## 2011-12-18 NOTE — Transfer of Care (Signed)
Immediate Anesthesia Transfer of Care Note  Patient: Teresa Wong  Procedure(s) Performed: Procedure(s) (LRB): DILATATION AND EVACUATION (N/A)  Patient Location: PACU  Anesthesia Type: General  Level of Consciousness: awake, alert  and oriented  Airway & Oxygen Therapy: Patient Spontanous Breathing and Patient connected to nasal cannula oxygen  Post-op Assessment: Report given to PACU RN and Post -op Vital signs reviewed and stable  Post vital signs: Reviewed and stable  Complications: No apparent anesthesia complications

## 2011-12-18 NOTE — Anesthesia Preprocedure Evaluation (Signed)
Anesthesia Evaluation  Patient identified by MRN, date of birth, ID band Patient awake    Reviewed: Allergy & Precautions, H&P , NPO status , Patient's Chart, lab work & pertinent test results, reviewed documented beta blocker date and time   History of Anesthesia Complications Negative for: history of anesthetic complications  Airway Mallampati: I TM Distance: >3 FB Neck ROM: full    Dental  (+) Teeth Intact   Pulmonary neg pulmonary ROS,  breath sounds clear to auscultation  Pulmonary exam normal       Cardiovascular hypertension (PIH in 2 pregnancies, BP runs "high normal" inbetween pregnancies), Rhythm:regular Rate:Normal     Neuro/Psych negative neurological ROS  negative psych ROS   GI/Hepatic negative GI ROS, Neg liver ROS,   Endo/Other  negative endocrine ROS  Renal/GU negative Renal ROS  negative genitourinary   Musculoskeletal   Abdominal   Peds  Hematology negative hematology ROS (+)   Anesthesia Other Findings   Reproductive/Obstetrics (+) Pregnancy (6 weeks missed ab)                           Anesthesia Physical Anesthesia Plan  ASA: II  Anesthesia Plan: General LMA   Post-op Pain Management:    Induction:   Airway Management Planned:   Additional Equipment:   Intra-op Plan:   Post-operative Plan:   Informed Consent: I have reviewed the patients History and Physical, chart, labs and discussed the procedure including the risks, benefits and alternatives for the proposed anesthesia with the patient or authorized representative who has indicated his/her understanding and acceptance.   Dental Advisory Given  Plan Discussed with: CRNA and Surgeon  Anesthesia Plan Comments:         Anesthesia Quick Evaluation

## 2011-12-18 NOTE — Op Note (Signed)
12/18/2011  12:52 PM  PATIENT:  Teresa Wong  36 y.o. female  PRE-OPERATIVE DIAGNOSIS:  Missed ab  POST-OPERATIVE DIAGNOSIS: missed ab  PROCEDURE:  Procedure(s) (LRB): DILATATION AND EVACUATION (N/A)  SURGEON:  Surgeon(s) and Role:    * Daria Pastures, MD - Primary  ANESTHESIA:   general  EBL:  Total I/O In: 1200 [I.V.:1200] Out: 250 [Urine:200; Blood:50]  SPECIMEN:  Source of Specimen:  uterine currettings  DISPOSITION OF SPECIMEN:  PATHOLOGY  COUNTS:  YES  PLAN OF CARE: Discharge to home after PACU  PATIENT DISPOSITION:  PACU - hemodynamically stable.   Delay start of Pharmacological VTE agent (>24hrs) due to surgical blood loss or risk of bleeding: not applicable  Medications: Methergine  Complications: None  Findings:  9 week size uterus to 6 size post procedure.  Good crie was achieved.  After adequate anesthesia was achieved, the patient was prepped and draped in the usual sterile fashion.  The speculum was placed in the vagina and the cervix stabilized with a single-tooth tenaculum.  The cervix was dilated with Kennon Rounds dilators and the 9 mm curette was used to remove contents of the uterus.  Alternating sharp curettage with a curette and suction curettage was performed until all contents were removed and good crie was achieved.  All instruments were removed from the vagina.  The patient tolerated the procedure well.    Bernardo Brayman A

## 2011-12-18 NOTE — Brief Op Note (Signed)
12/18/2011  12:52 PM  PATIENT:  Teresa Wong  36 y.o. female  PRE-OPERATIVE DIAGNOSIS:  Missed ab  POST-OPERATIVE DIAGNOSIS: missed ab  PROCEDURE:  Procedure(s) (LRB): DILATATION AND EVACUATION (N/A)  SURGEON:  Surgeon(s) and Role:    * Daria Pastures, MD - Primary  ANESTHESIA:   general  EBL:  Total I/O In: 1200 [I.V.:1200] Out: 250 [Urine:200; Blood:50]  SPECIMEN:  Source of Specimen:  uterine currettings  DISPOSITION OF SPECIMEN:  PATHOLOGY  COUNTS:  YES  PLAN OF CARE: Discharge to home after PACU  PATIENT DISPOSITION:  PACU - hemodynamically stable.   Delay start of Pharmacological VTE agent (>24hrs) due to surgical blood loss or risk of bleeding: not applicable  Medications: Methergine  Complications: None  Findings:  9 week size uterus to 6 size post procedure.  Good crie was achieved.  After adequate anesthesia was achieved, the patient was prepped and draped in the usual sterile fashion.  The speculum was placed in the vagina and the cervix stabilized with a single-tooth tenaculum.  The cervix was dilated with Kennon Rounds dilators and the 9 mm curette was used to remove contents of the uterus.  Alternating sharp curettage with a curette and suction curettage was performed until all contents were removed and good crie was achieved.  All instruments were removed from the vagina.  The patient tolerated the procedure well.    Kou Gucciardo A

## 2011-12-18 NOTE — Anesthesia Postprocedure Evaluation (Signed)
Anesthesia Post Note  Patient: Teresa Wong  Procedure(s) Performed: Procedure(s) (LRB): DILATATION AND EVACUATION (N/A)  Anesthesia type: General  Patient location: PACU  Post pain: Pain level controlled  Post assessment: Post-op Vital signs reviewed  Last Vitals:  Filed Vitals:   12/18/11 1315  BP: 115/62  Pulse: 70  Temp: 36.8 C  Resp: 16    Post vital signs: Reviewed  Level of consciousness: sedated  Complications: No apparent anesthesia complicationsfj

## 2011-12-18 NOTE — Discharge Instructions (Signed)
DISCHARGE INSTRUCTIONS: D&C / D&E The following instructions have been prepared to help you care for yourself upon your return home.   Personal hygiene: Marland Kitchen Use sanitary pads for vaginal drainage, not tampons. . Shower the day after your procedure. . NO tub baths, pools or Jacuzzis for 2-3 weeks. . Wipe front to back after using the bathroom.  Activity and limitations: . Do NOT drive or operate any equipment for 24 hours. The effects of anesthesia are still present and drowsiness may result. . Do NOT rest in bed all day. . Walking is encouraged. . Walk up and down stairs slowly. . You may resume your normal activity in one to two days or as indicated by your physician.  Sexual activity: NO intercourse for at least 2 weeks after the procedure, or as indicated by your physician.  Diet: Eat a light meal as desired this evening. You may resume your usual diet tomorrow.  Return to work: You may resume your work activities in one to two days or as indicated by your doctor.  What to expect after your surgery: Expect to have vaginal bleeding/discharge for 2-3 days and spotting for up to 10 days. It is not unusual to have soreness for up to 1-2 weeks. You may have a slight burning sensation when you urinate for the first day. Mild cramps may continue for a couple of days. You may have a regular period in 2-6 weeks.  Call your doctor for any of the following: . Excessive vaginal bleeding, saturating and changing one pad every hour. . Inability to urinate 6 hours after discharge from hospital. . Pain not relieved by pain medication. . Fever of 100.4 F or greater. . Unusual vaginal discharge or odor.  Return to office ________________ Call for an appointment ___________________  Patient's signature: ______________________  Nurse's signature ________________________  [place] [facility] [phone]

## 2011-12-18 NOTE — H&P (Signed)
36 y.o. yo complains of MAB at 6 weeks.  Past Medical History  Diagnosis Date  . Abnormal Pap smear   . History of LEEP (loop electrosurgical excision procedure) of cervix complicating pregnancy 8381  . Pregnancy induced hypertension   . Meningitis 1984   Past Surgical History  Procedure Date  . Dilation and curettage of uterus     History   Social History  . Marital Status: Married    Spouse Name: N/A    Number of Children: N/A  . Years of Education: N/A   Occupational History  . Not on file.   Social History Main Topics  . Smoking status: Not on file  . Smokeless tobacco: Not on file  . Alcohol Use: No  . Drug Use: No  . Sexually Active: No   Other Topics Concern  . Not on file   Social History Narrative  . No narrative on file    No current facility-administered medications on file prior to encounter.   Current Outpatient Prescriptions on File Prior to Encounter  Medication Sig Dispense Refill  . prenatal vitamin w/FE, FA (PRENATAL 1 + 1) 27-1 MG TABS Take 1 tablet by mouth daily.          No Known Allergies  Filed Vitals:   12/18/11 1042  BP: 142/95  Pulse: 74  Temp: 98.6 F (37 C)  Resp: 18     Lungs: clear to ascultation Cor:  RRR Abdomen:  soft, nontender, nondistended. Ex:  no cords, erythema Pelvic:  8 weeks size, closed cervix  A:  MAB at 6 wks. Rh+   P:All risks, benefits and alternatives d/w patient and she desires to proceed with a D&E .

## 2011-12-19 ENCOUNTER — Encounter (HOSPITAL_COMMUNITY): Payer: Self-pay | Admitting: Obstetrics and Gynecology

## 2012-10-08 ENCOUNTER — Emergency Department
Admission: EM | Admit: 2012-10-08 | Discharge: 2012-10-08 | Disposition: A | Discharge status: Home / Self Care | Payer: 59 | Source: Home / Self Care | Attending: Family Medicine | Admitting: Family Medicine

## 2012-10-08 ENCOUNTER — Encounter: Payer: Self-pay | Admitting: Emergency Medicine

## 2012-10-08 DIAGNOSIS — J020 Streptococcal pharyngitis: Secondary | ICD-10-CM

## 2012-10-08 DIAGNOSIS — J029 Acute pharyngitis, unspecified: Secondary | ICD-10-CM

## 2012-10-08 LAB — POCT RAPID STREP A (OFFICE): Rapid Strep A Screen: POSITIVE — AB

## 2012-10-08 MED ORDER — PENICILLIN V POTASSIUM 500 MG PO TABS: ORAL_TABLET | ORAL | Status: DC

## 2012-10-08 NOTE — ED Notes (Signed)
Patient states has had progressively worsening sore throat, body ache and fever over past 2 days. Did have Flu vaccination this season. Did take Tylenol at 0700 today.

## 2012-10-08 NOTE — Discharge Instructions (Signed)
Try warm salt water gargles.  May take Ibuprofen 263m, 4 tabs every 8 hours with food.     Strep Throat Strep throat is an infection of the throat caused by a bacteria named Streptococcus pyogenes. Your caregiver may call the infection streptococcal "tonsillitis" or "pharyngitis" depending on whether there are signs of inflammation in the tonsils or back of the throat. Strep throat is most common in children from 564to 189years old during the cold months of the year, but it can occur in people of any age during any season. This infection is spread from person to person (contagious) through coughing, sneezing, or other close contact. SYMPTOMS   Fever or chills.  Painful, swollen, red tonsils or throat.  Pain or difficulty when swallowing.  White or yellow spots on the tonsils or throat.  Swollen, tender lymph nodes or "glands" of the neck or under the jaw.  Red rash all over the body (rare). DIAGNOSIS  Many different infections can cause the same symptoms. A test must be done to confirm the diagnosis so the right treatment can be given. A "rapid strep test" can help your caregiver make the diagnosis in a few minutes. If this test is not available, a light swab of the infected area can be used for a throat culture test. If a throat culture test is done, results are usually available in a day or two. TREATMENT  Strep throat is treated with antibiotic medicine. HOME CARE INSTRUCTIONS   Gargle with 1 tsp of salt in 1 cup of warm water, 3 to 4 times per day or as needed for comfort.  Family members who also have a sore throat or fever should be tested for strep throat and treated with antibiotics if they have the strep infection.  Make sure everyone in your household washes their hands well.  Do not share food, drinking cups, or personal items that could cause the infection to spread to others.  You may need to eat a soft food diet until your sore throat gets better.  Drink enough water  and fluids to keep your urine clear or pale yellow. This will help prevent dehydration.  Get plenty of rest.  Stay home from school, daycare, or work until you have been on antibiotics for 24 hours.  Only take over-the-counter or prescription medicines for pain, discomfort, or fever as directed by your caregiver.  If antibiotics are prescribed, take them as directed. Finish them even if you start to feel better. SEEK MEDICAL CARE IF:   The glands in your neck continue to enlarge.  You develop a rash, cough, or earache.  You cough up green, yellow-brown, or bloody sputum.  You have pain or discomfort not controlled by medicines.  Your problems seem to be getting worse rather than better. SEEK IMMEDIATE MEDICAL CARE IF:   You develop any new symptoms such as vomiting, severe headache, stiff or painful neck, chest pain, shortness of breath, or trouble swallowing.  You develop severe throat pain, drooling, or changes in your voice.  You develop swelling of the neck, or the skin on the neck becomes red and tender.  You have a fever.  You develop signs of dehydration, such as fatigue, dry mouth, and decreased urination.  You become increasingly sleepy, or you cannot wake up completely. Document Released: 07/13/2000 Document Revised: 10/08/2011 Document Reviewed: 09/14/2010 [facility] Information 2013 [place]

## 2012-10-08 NOTE — ED Provider Notes (Signed)
History     CSN: [id number]  Arrival date & time 10/08/12  8366   First MD Initiated Contact with Patient 10/08/12 [phone]      Chief Complaint  Patient presents with  . Sore Throat  . Generalized Body Aches  . Fever       HPI Comments: Patient complains of onset of sore throat yesterday with headache, chills, and myalgias.  No nasal congestion or cough.  She has had influenza immunization for this season.   The history is provided by the patient.    Past Medical History  Diagnosis Date  . Abnormal Pap smear   . History of LEEP (loop electrosurgical excision procedure) of cervix complicating pregnancy 6546  . Pregnancy induced hypertension   . Meningitis 1984    Past Surgical History  Procedure Laterality Date  . Dilation and curettage of uterus    . Dilation and evacuation  12/18/2011    Procedure: DILATATION AND EVACUATION;  Surgeon: Daria Pastures, MD;  Location: [facility];  Service: Gynecology;  Laterality: N/A;    Family History  Problem Relation Age of Onset  . Hypertension Mother     History  Substance Use Topics  . Smoking status: Not on file  . Smokeless tobacco: Not on file  . Alcohol Use: No    OB History   Grav Para Term Preterm Abortions TAB SAB Ect Mult Living   5 2 1 1 3  0 3 0 0 1      Review of Systems + sore throat No cough No pleuritic pain No wheezing No nasal congestion No post-nasal drainage No sinus pain/pressure No itchy/red eyes ? earache No hemoptysis No SOB + fever, + chills No nausea No vomiting No abdominal pain No diarrhea No urinary symptoms No skin rashes + fatigue + myalgias + headache Used OTC meds without relief  Allergies  Review of patient's allergies indicates no known allergies.  Home Medications   Current Outpatient Rx  Name  Route  Sig  Dispense  Refill  . aspirin 81 MG tablet   Oral   Take 81 mg by mouth daily.         . Multiple Vitamin (MULTIVITAMIN) tablet   Oral   Take 1 tablet by  mouth daily.         . penicillin v potassium (VEETID) 500 MG tablet      Take one tab by mouth twice daily for 10 days   20 tablet   0   . prenatal vitamin w/FE, FA (PRENATAL 1 + 1) 27-1 MG TABS   Oral   Take 1 tablet by mouth daily.             BP 107/0  Temp(Src) 101.7 F (38.7 C) (Oral)  Ht 5' 8"  (1.727 m)  Wt 180 lb (81.647 kg)  BMI 27.38 kg/m2  SpO2 98%  LMP 10/06/2012  Physical Exam Nursing notes and Vital Signs reviewed. Appearance:  Patient appears healthy, stated age, and in no acute distress Eyes:  Pupils are equal, round, and reactive to light and accomodation.  Extraocular movement is intact.  Conjunctivae are not inflamed  Ears:  Canals normal.  Tympanic membranes normal.  Nose:  Normal turbinates.  No sinus tenderness.  Pharynx:  Erythematous and slightly swollen without obstruction.  Small amount of exudate present bilaterally Neck:  Supple.   Tender enlarged anterior nodes are palpated bilaterally  Lungs:  Clear to auscultation.  Breath sounds are equal.  Heart:  Regular  rate and rhythm without murmurs, rubs, or gallops.  Abdomen:  Nontender without masses or hepatosplenomegaly.  Bowel sounds are present.  No CVA or flank tenderness.  Skin:  No rash present.   ED Course  Procedures  none  Labs Reviewed  POCT RAPID STREP A (OFFICE) positive      1. Streptococcal sore throat       MDM  Begin penicillin for 10 days. Try warm salt water gargles.  May take Ibuprofen 263m, 4 tabs every 8 hours with food Followup with Family Doctor if not improved in one week.         SKandra Nicolas MD 10/08/12 [phone]

## 2013-03-17 ENCOUNTER — Other Ambulatory Visit: Payer: Self-pay | Admitting: Obstetrics and Gynecology

## 2013-05-28 LAB — OB RESULTS CONSOLE ABO/RH: RH Type: POSITIVE

## 2013-05-28 LAB — OB RESULTS CONSOLE HIV ANTIBODY (ROUTINE TESTING): HIV: NONREACTIVE

## 2013-05-28 LAB — OB RESULTS CONSOLE HEPATITIS B SURFACE ANTIGEN: Hepatitis B Surface Ag: NEGATIVE

## 2013-05-28 LAB — OB RESULTS CONSOLE RUBELLA ANTIBODY, IGM: Rubella: IMMUNE

## 2013-05-28 LAB — OB RESULTS CONSOLE ANTIBODY SCREEN: Antibody Screen: NEGATIVE

## 2013-05-28 LAB — OB RESULTS CONSOLE RPR: RPR: NONREACTIVE

## 2013-11-12 ENCOUNTER — Encounter (HOSPITAL_COMMUNITY): Payer: Self-pay | Admitting: *Deleted

## 2013-11-12 ENCOUNTER — Inpatient Hospital Stay (HOSPITAL_COMMUNITY)
Admission: AD | Admit: 2013-11-12 | Discharge: 2013-11-12 | Disposition: A | Discharge status: Home / Self Care | Payer: 59 | Source: Ambulatory Visit | Attending: Obstetrics and Gynecology | Admitting: Obstetrics and Gynecology

## 2013-11-12 ENCOUNTER — Encounter (HOSPITAL_COMMUNITY): Payer: Self-pay | Admitting: Pharmacist

## 2013-11-12 DIAGNOSIS — O09299 Supervision of pregnancy with other poor reproductive or obstetric history, unspecified trimester: Secondary | ICD-10-CM | POA: Insufficient documentation

## 2013-11-12 DIAGNOSIS — O288 Other abnormal findings on antenatal screening of mother: Secondary | ICD-10-CM

## 2013-11-12 DIAGNOSIS — O289 Unspecified abnormal findings on antenatal screening of mother: Secondary | ICD-10-CM

## 2013-11-12 DIAGNOSIS — O368390 Maternal care for abnormalities of the fetal heart rate or rhythm, unspecified trimester, not applicable or unspecified: Secondary | ICD-10-CM | POA: Insufficient documentation

## 2013-11-12 DIAGNOSIS — O409XX0 Polyhydramnios, unspecified trimester, not applicable or unspecified: Secondary | ICD-10-CM | POA: Insufficient documentation

## 2013-11-12 LAB — URINALYSIS, ROUTINE W REFLEX MICROSCOPIC
BILIRUBIN URINE: NEGATIVE
Glucose, UA: NEGATIVE mg/dL
HGB URINE DIPSTICK: NEGATIVE
Ketones, ur: NEGATIVE mg/dL
Leukocytes, UA: NEGATIVE
Nitrite: NEGATIVE
PH: 6.5 (ref 5.0–8.0)
Protein, ur: NEGATIVE mg/dL
SPECIFIC GRAVITY, URINE: 1.025 (ref 1.005–1.030)
Urobilinogen, UA: 1 mg/dL (ref 0.0–1.0)

## 2013-11-12 NOTE — Discharge Instructions (Signed)
Fetal Biophysical Profile This is a test that measures five different variables of the fetus: Heart rate, breathing movement, total movement of the baby, fetal muscle tone, the amount of amniotic fluid, and the heart rate activity of the fetus. The five variables are measured individually and contribute either a 2 or a 0 to the overall scoring of the test. The measurements are as follows:  Fetal heart rate activity. This is measured and scored in the same way as a non-stress test. The fetal heart rate is considered reactive when there are movement-associated fetal heart rate increases of at least 15 beats per minute above baseline, and 15 seconds in duration over a 20-minute period. A score of 2 is given for reactivity, and a score of 0 indicates that the fetal heart rate is non-reactive.  Fetal breathing movements. This is scored based on fetal breathing movements and indicate fetal well-being. Their absence may indicate a low oxygen level for the fetus. Fetal breathing increases in frequency and uniformity after the 36th week of pregnancy. To earn a score of 2, the fetus must have at least one episode of fetal breathing lasting at least 60 seconds within a 30-minute observation. Absence of this breathing is scored a 0 on the BPP.  Fetal body movements. Fetal activity is a reflection of brain integrity and function. The presence of at least three episodes of fetal movements within a 30-minute period is given a score of 2. A score of 0 is given with two or less movements in this time period. Fetal activity is highest 1 to 3 hours after the mother has eaten a meal.  Fetal tone. In the uterus, the fetus is normally in a position of flexion. This means the head is bent down towards the knees. The fetus also stretches, rolls, and moves in the uterus. The arms, legs, trunk, and head may be flexed and extended. A score of 2 is earned when there is at least one episode of active extension with return flexion. A  score of 0 is given for slow extension with a return to only partial flexion. Fetal movement not followed by return to flexion, limbs or spine in extension, and an open fetal hand score 0.  Amniotic fluid volume. Amniotic fluid volume has been demonstrated to be a good method of predicting fetal distress. Too little amniotic fluid has been associated with fetal abnormalities, slow uterine growth, and over due pregnancy. A score of 2 is given for this when there is at least one pocket of amniotic fluid that measures 1 cm in a specific area. A score of 0 indicates either that fluid is absent in most areas of the uterine cavity or that the largest pocket of fluid measures less than 1 cm. PREPARATION FOR TEST No preparation or fasting is necessary. NORMAL FINDINGS  A score of 8-10 points (if amniotic fluid volume is adequate).  Possible critical values: Less than 4 may necessitate immediate delivery of fetus. Ranges for normal findings may vary among different laboratories and hospitals. You should always check with your doctor after having lab work or other tests done to discuss the meaning of your test results and whether your values are considered within normal limits. MEANING OF TEST  Your caregiver will go over the test results with you and discuss the importance and meaning of your results, as well as treatment options and the need for additional tests if necessary. OBTAINING THE TEST RESULTS  It is your responsibility to obtain your test  results. Ask the lab or department performing the test when and how you will get your results. Document Released: 11/16/2004 Document Revised: 10/08/2011 Document Reviewed: 06/25/2008 [facility] Patient Information 2014 [place], [place].

## 2013-11-12 NOTE — MAU Note (Signed)
Hx of IUFD.  Being monitored in the office.  irreg contractions. No leaking or bleeding.

## 2013-11-12 NOTE — MAU Provider Note (Signed)
History     CSN: [id number]  Arrival date and time: 11/12/13 1725   First Provider Initiated Contact with Patient 11/12/13 1809      Chief Complaint  Patient presents with  . non reactive in offcie    HPI This is a 38 y.o. female at 77w0dwho presents from office for extended monitoring. Has been getting twice weekly monitoring and BPPs for history of IUFD and polyhydramnios.  Had a normal BPP today with reactive NST that showed two decels. So she is here for an hour of monitoring.   RN Note: Hx of IUFD. Being monitored in the office. irreg contractions. No leaking or bleeding.       OB History   Grav Para Term Preterm Abortions TAB SAB Ect Mult Living   7 2 1 1 3  0 3 0 0 1      Past Medical History  Diagnosis Date  . Abnormal Pap smear   . History of LEEP (loop electrosurgical excision procedure) of cervix complicating pregnancy 27048 . Pregnancy induced hypertension   . Meningitis 1984    Past Surgical History  Procedure Laterality Date  . Dilation and curettage of uterus    . Dilation and evacuation  12/18/2011    Procedure: DILATATION AND EVACUATION;  Surgeon: MDaria Pastures MD;  [facility];  Service: Gynecology;  Laterality: N/A;    Family History  Problem Relation Age of Onset  . Hypertension Mother     History  Substance Use Topics  . Smoking status: Never Smoker   . Smokeless tobacco: Not on file  . Alcohol Use: No    Allergies: No Known Allergies  Prescriptions prior to admission  Medication Sig Dispense Refill  . aspirin EC 81 MG tablet Take 81 mg by mouth daily.      . folic acid (FOLVITE) 1 MG tablet Take 3 mg by mouth daily.      . heparin 5000 UNIT/ML injection Inject 5,000 Units into the skin every 12 (twelve) hours.      . prenatal vitamin w/FE, FA (PRENATAL 1 + 1) 27-1 MG TABS Take 1 tablet by mouth daily.          Review of Systems  Constitutional: Negative for fever and malaise/fatigue.  Gastrointestinal: Negative for  nausea, vomiting and abdominal pain.  Genitourinary:       Contractions, not painful   Neurological: Negative for dizziness.   Physical Exam   Blood pressure 127/83, pulse 84, temperature 98.5 F (36.9 C), temperature source Oral, resp. rate 18, height 5' 6"  (1.676 m), weight 91.173 kg (201 lb).  Physical Exam  Constitutional: She is oriented to person, place, and time. She appears well-developed and well-nourished. No distress.  HENT:  Head: Normocephalic.  Cardiovascular: Normal rate.   Respiratory: Effort normal.  GI: Soft. There is no tenderness. There is no rebound and no guarding.  Genitourinary: Vagina normal and uterus normal. No vaginal discharge found.  Cervix long and closed FHR reactive, UCs every 6 minutes   Musculoskeletal: Normal range of motion.  Neurological: She is alert and oriented to person, place, and time.  Skin: Skin is warm and dry.  Psychiatric: She has a normal mood and affect.    MAU Course  Procedures  MDM EFM for over one hour.  Reactive entire tracing. No decels. UCs every 6-8 minutes Dr HPhilis Piqueupdated  Assessment and Plan  A:  SIUP at 350w0d     History of IUFD  Reassuring FHR tracing  P:  Discharge home       FM monitoring       Follow up in office  Seabron Spates 11/12/2013, 6:19 PM

## 2013-11-16 NOTE — MAU Provider Note (Signed)
Attestation of Attending Supervision of Advanced Practitioner (PA/CNM/NP): Evaluation and management procedures were performed by the Advanced Practitioner under my supervision and collaboration.  I have reviewed the Advanced Practitioner's note and chart, and I agree with the management and plan.  Verita Schneiders, MD, [place] Attending [place], [place]

## 2013-11-25 ENCOUNTER — Encounter (HOSPITAL_COMMUNITY)
Admission: RE | Admit: 2013-11-25 | Discharge: 2013-11-25 | Disposition: A | Discharge status: Home / Self Care | Payer: 59 | Source: Ambulatory Visit | Attending: Obstetrics and Gynecology | Admitting: Obstetrics and Gynecology

## 2013-11-25 ENCOUNTER — Encounter (HOSPITAL_COMMUNITY): Payer: Self-pay

## 2013-11-25 ENCOUNTER — Other Ambulatory Visit: Payer: Self-pay | Admitting: Obstetrics and Gynecology

## 2013-11-25 ENCOUNTER — Encounter (HOSPITAL_COMMUNITY): Payer: Self-pay | Admitting: *Deleted

## 2013-11-25 HISTORY — DX: Chromosomal abnormality, unspecified: Q99.9

## 2013-11-25 LAB — CBC
HEMATOCRIT: 37.5 % (ref 36.0–46.0)
HEMOGLOBIN: 13.3 g/dL (ref 12.0–15.0)
MCH: 31.2 pg (ref 26.0–34.0)
MCHC: 35.5 g/dL (ref 30.0–36.0)
MCV: 88 fL (ref 78.0–100.0)
Platelets: 228 10*3/uL (ref 150–400)
RBC: 4.26 MIL/uL (ref 3.87–5.11)
RDW: 14.2 % (ref 11.5–15.5)
WBC: 8.5 10*3/uL (ref 4.0–10.5)

## 2013-11-25 LAB — APTT: APTT: 28 s (ref 24–37)

## 2013-11-25 LAB — OB RESULTS CONSOLE RPR: RPR: NONREACTIVE

## 2013-11-25 LAB — RPR

## 2013-11-25 NOTE — Patient Instructions (Addendum)
   Your procedure is scheduled on:  THURSDAY APRIL 30  Enter through the Main Entrance of [facility] at:  56 am Pick up the phone at the desk and dial [phone] and inform us of your arrival.  Please call this number if you have any problems the morning of surgery: [phone]  Remember: Do not eat food after midnight: WEDNESDAY Do not drink clear liquids after: 8 AM Thursday, DAY OF SURGERY Take these medicines the morning of surgery with a SIP OF WATER:  None  Do not wear jewelry, make-up, or FINGER nail polish No metal in your hair or on your body. Do not wear lotions, powders, perfumes.  You may wear deodorant.  Do not bring valuables to the hospital. Contacts, dentures or bridgework may not be worn into surgery.  Leave suitcase in the car. After Surgery it may be brought to your room. For patients being admitted to the hospital, checkout time is 11:00am the day of discharge.  Home with husband Teresa Wong cell [phone].

## 2013-11-26 ENCOUNTER — Encounter (HOSPITAL_COMMUNITY): Payer: 59 | Admitting: Anesthesiology

## 2013-11-26 ENCOUNTER — Encounter (HOSPITAL_COMMUNITY)
Admission: AD | Disposition: A | Discharge status: Home / Self Care | Payer: Self-pay | Source: Ambulatory Visit | Attending: Obstetrics and Gynecology

## 2013-11-26 ENCOUNTER — Inpatient Hospital Stay (HOSPITAL_COMMUNITY)
Admission: AD | Admit: 2013-11-26 | Discharge: 2013-11-29 | DRG: 766 | Disposition: A | Discharge status: Home / Self Care | Payer: 59 | Source: Ambulatory Visit | Attending: Obstetrics and Gynecology | Admitting: Obstetrics and Gynecology

## 2013-11-26 ENCOUNTER — Encounter (HOSPITAL_COMMUNITY): Payer: Self-pay | Admitting: *Deleted

## 2013-11-26 ENCOUNTER — Encounter (HOSPITAL_COMMUNITY): Admission: RE | Payer: Self-pay | Source: Ambulatory Visit

## 2013-11-26 ENCOUNTER — Inpatient Hospital Stay (HOSPITAL_COMMUNITY): Admission: RE | Admit: 2013-11-26 | Payer: 59 | Source: Ambulatory Visit | Admitting: Obstetrics and Gynecology

## 2013-11-26 ENCOUNTER — Inpatient Hospital Stay (HOSPITAL_COMMUNITY): Payer: 59 | Admitting: Anesthesiology

## 2013-11-26 DIAGNOSIS — O09529 Supervision of elderly multigravida, unspecified trimester: Secondary | ICD-10-CM | POA: Diagnosis present

## 2013-11-26 DIAGNOSIS — O409XX0 Polyhydramnios, unspecified trimester, not applicable or unspecified: Principal | ICD-10-CM | POA: Diagnosis present

## 2013-11-26 DIAGNOSIS — Z302 Encounter for sterilization: Secondary | ICD-10-CM

## 2013-11-26 DIAGNOSIS — Z9889 Other specified postprocedural states: Secondary | ICD-10-CM

## 2013-11-26 DIAGNOSIS — Z8661 Personal history of infections of the central nervous system: Secondary | ICD-10-CM

## 2013-11-26 HISTORY — PX: TUBAL LIGATION: SHX77

## 2013-11-26 LAB — TYPE AND SCREEN
ABO/RH(D): O POS
ABO/RH(D): O POS
ANTIBODY SCREEN: NEGATIVE
ANTIBODY SCREEN: NEGATIVE

## 2013-11-26 LAB — CBC
HCT: 36.2 % (ref 36.0–46.0)
Hemoglobin: 12.6 g/dL (ref 12.0–15.0)
MCH: 30.4 pg (ref 26.0–34.0)
MCHC: 34.8 g/dL (ref 30.0–36.0)
MCV: 87.4 fL (ref 78.0–100.0)
PLATELETS: 214 10*3/uL (ref 150–400)
RBC: 4.14 MIL/uL (ref 3.87–5.11)
RDW: 14.1 % (ref 11.5–15.5)
WBC: 8.9 10*3/uL (ref 4.0–10.5)

## 2013-11-26 LAB — OB RESULTS CONSOLE GC/CHLAMYDIA
Chlamydia: NEGATIVE
Gonorrhea: NEGATIVE

## 2013-11-26 LAB — RPR

## 2013-11-26 LAB — OB RESULTS CONSOLE GBS: GBS: NEGATIVE

## 2013-11-26 SURGERY — Surgical Case
Anesthesia: Epidural | Site: Abdomen

## 2013-11-26 SURGERY — Surgical Case
Anesthesia: Regional

## 2013-11-26 MED ORDER — EPHEDRINE 5 MG/ML INJ: 10.0000 mg | INTRAVENOUS | Status: DC | PRN

## 2013-11-26 MED ORDER — LACTATED RINGERS IV SOLN: 500.0000 mL | INTRAVENOUS | Status: DC | PRN

## 2013-11-26 MED ORDER — IBUPROFEN 600 MG PO TABS: 600.0000 mg | ORAL_TABLET | Freq: Four times a day (QID) | ORAL | Status: DC | PRN

## 2013-11-26 MED ORDER — PRENATAL MULTIVITAMIN CH
1.0000 | ORAL_TABLET | Freq: Every day | ORAL | Status: DC
  Administered 2013-11-27 – 2013-11-29 (×3): 1 via ORAL
  Filled 2013-11-26 (×3): qty 1

## 2013-11-26 MED ORDER — MEPERIDINE HCL 25 MG/ML IJ SOLN: 6.2500 mg | INTRAMUSCULAR | Status: DC | PRN

## 2013-11-26 MED ORDER — NALBUPHINE HCL 10 MG/ML IJ SOLN: 5.0000 mg | INTRAMUSCULAR | Status: DC | PRN

## 2013-11-26 MED ORDER — OXYTOCIN 40 UNITS IN LACTATED RINGERS INFUSION - SIMPLE MED
1.0000 m[IU]/min | INTRAVENOUS | Status: DC
  Administered 2013-11-26: 2 m[IU]/min via INTRAVENOUS

## 2013-11-26 MED ORDER — METHYLERGONOVINE MALEATE 0.2 MG PO TABS: 0.2000 mg | ORAL_TABLET | ORAL | Status: DC | PRN

## 2013-11-26 MED ORDER — NALOXONE HCL 1 MG/ML IJ SOLN
1.0000 ug/kg/h | INTRAMUSCULAR | Status: DC | PRN
  Filled 2013-11-26: qty 2

## 2013-11-26 MED ORDER — CITRIC ACID-SODIUM CITRATE 334-500 MG/5ML PO SOLN
30.0000 mL | ORAL | Status: DC | PRN
  Administered 2013-11-26: 30 mL via ORAL
  Filled 2013-11-26: qty 15

## 2013-11-26 MED ORDER — CEFAZOLIN SODIUM-DEXTROSE 2-3 GM-% IV SOLR
INTRAVENOUS | Status: DC | PRN
  Administered 2013-11-26: 2 g via INTRAVENOUS

## 2013-11-26 MED ORDER — MENTHOL 3 MG MT LOZG: 1.0000 | LOZENGE | OROMUCOSAL | Status: DC | PRN

## 2013-11-26 MED ORDER — OXYTOCIN 40 UNITS IN LACTATED RINGERS INFUSION - SIMPLE MED: 62.5000 mL/h | INTRAVENOUS | Status: AC

## 2013-11-26 MED ORDER — ONDANSETRON HCL 4 MG PO TABS
4.0000 mg | ORAL_TABLET | ORAL | Status: DC | PRN
Start: 2013-11-26 — End: 2013-11-29

## 2013-11-26 MED ORDER — DIPHENHYDRAMINE HCL 50 MG/ML IJ SOLN: 12.5000 mg | INTRAMUSCULAR | Status: DC | PRN

## 2013-11-26 MED ORDER — MEASLES, MUMPS & RUBELLA VAC ~~LOC~~ INJ: 0.5000 mL | INJECTION | Freq: Once | SUBCUTANEOUS | Status: DC

## 2013-11-26 MED ORDER — IBUPROFEN 600 MG PO TABS
600.0000 mg | ORAL_TABLET | Freq: Four times a day (QID) | ORAL | Status: DC
  Administered 2013-11-26 – 2013-11-29 (×10): 600 mg via ORAL
  Filled 2013-11-26 (×10): qty 1

## 2013-11-26 MED ORDER — MORPHINE SULFATE (PF) 0.5 MG/ML IJ SOLN
INTRAMUSCULAR | Status: DC | PRN
  Administered 2013-11-26: .15 mg via INTRATHECAL

## 2013-11-26 MED ORDER — SCOPOLAMINE 1 MG/3DAYS TD PT72
1.0000 | MEDICATED_PATCH | Freq: Once | TRANSDERMAL | Status: DC
  Administered 2013-11-26: 1.5 mg via TRANSDERMAL

## 2013-11-26 MED ORDER — PHENYLEPHRINE 40 MCG/ML (10ML) SYRINGE FOR IV PUSH (FOR BLOOD PRESSURE SUPPORT): 80.0000 ug | PREFILLED_SYRINGE | INTRAVENOUS | Status: DC | PRN

## 2013-11-26 MED ORDER — FLEET ENEMA 7-19 GM/118ML RE ENEM: 1.0000 | ENEMA | RECTAL | Status: DC | PRN

## 2013-11-26 MED ORDER — DIPHENHYDRAMINE HCL 50 MG/ML IJ SOLN: 25.0000 mg | INTRAMUSCULAR | Status: DC | PRN

## 2013-11-26 MED ORDER — DIPHENHYDRAMINE HCL 25 MG PO CAPS: 25.0000 mg | ORAL_CAPSULE | Freq: Four times a day (QID) | ORAL | Status: DC | PRN

## 2013-11-26 MED ORDER — FENTANYL 2.5 MCG/ML BUPIVACAINE 1/10 % EPIDURAL INFUSION (WH - ANES)
INTRAMUSCULAR | Status: DC | PRN
  Administered 2013-11-26: 14 mL/h via EPIDURAL

## 2013-11-26 MED ORDER — FLEET ENEMA 7-19 GM/118ML RE ENEM: 1.0000 | ENEMA | Freq: Every day | RECTAL | Status: DC | PRN

## 2013-11-26 MED ORDER — TERBUTALINE SULFATE 1 MG/ML IJ SOLN: 0.2500 mg | Freq: Once | INTRAMUSCULAR | Status: DC | PRN

## 2013-11-26 MED ORDER — BISACODYL 10 MG RE SUPP: 10.0000 mg | Freq: Every day | RECTAL | Status: DC | PRN

## 2013-11-26 MED ORDER — NALOXONE HCL 0.4 MG/ML IJ SOLN: 0.4000 mg | INTRAMUSCULAR | Status: DC | PRN

## 2013-11-26 MED ORDER — FENTANYL 2.5 MCG/ML BUPIVACAINE 1/10 % EPIDURAL INFUSION (WH - ANES)
14.0000 mL/h | INTRAMUSCULAR | Status: DC | PRN
Start: 2013-11-26 — End: 2013-11-26
  Filled 2013-11-26: qty 125

## 2013-11-26 MED ORDER — LACTATED RINGERS IV SOLN
INTRAVENOUS | Status: DC
  Administered 2013-11-26: 21:00:00 via INTRAVENOUS

## 2013-11-26 MED ORDER — OXYTOCIN 40 UNITS IN LACTATED RINGERS INFUSION - SIMPLE MED
1.0000 m[IU]/min | INTRAVENOUS | Status: DC
  Filled 2013-11-26: qty 1000

## 2013-11-26 MED ORDER — DIPHENHYDRAMINE HCL 25 MG PO CAPS
25.0000 mg | ORAL_CAPSULE | ORAL | Status: DC | PRN
  Filled 2013-11-26: qty 1

## 2013-11-26 MED ORDER — BUTORPHANOL TARTRATE 1 MG/ML IJ SOLN: 1.0000 mg | INTRAMUSCULAR | Status: DC | PRN

## 2013-11-26 MED ORDER — LACTATED RINGERS IV SOLN
INTRAVENOUS | Status: DC
  Administered 2013-11-26 (×3): via INTRAVENOUS

## 2013-11-26 MED ORDER — METHYLERGONOVINE MALEATE 0.2 MG/ML IJ SOLN: 0.2000 mg | INTRAMUSCULAR | Status: DC | PRN

## 2013-11-26 MED ORDER — OXYCODONE-ACETAMINOPHEN 5-325 MG PO TABS: 1.0000 | ORAL_TABLET | ORAL | Status: DC | PRN

## 2013-11-26 MED ORDER — LIDOCAINE-EPINEPHRINE (PF) 2 %-1:200000 IJ SOLN
INTRAMUSCULAR | Status: DC | PRN
  Administered 2013-11-26 (×4): 5 mL via EPIDURAL

## 2013-11-26 MED ORDER — PHENYLEPHRINE HCL 10 MG/ML IJ SOLN
INTRAMUSCULAR | Status: DC | PRN
  Administered 2013-11-26: 40 ug via INTRAVENOUS
  Administered 2013-11-26: 80 ug via INTRAVENOUS
  Administered 2013-11-26: 40 ug via INTRAVENOUS
  Administered 2013-11-26 (×2): 80 ug via INTRAVENOUS

## 2013-11-26 MED ORDER — FENTANYL CITRATE 0.05 MG/ML IJ SOLN: 25.0000 ug | INTRAMUSCULAR | Status: DC | PRN

## 2013-11-26 MED ORDER — ONDANSETRON HCL 4 MG/2ML IJ SOLN: 4.0000 mg | Freq: Three times a day (TID) | INTRAMUSCULAR | Status: DC | PRN

## 2013-11-26 MED ORDER — SIMETHICONE 80 MG PO CHEW
80.0000 mg | CHEWABLE_TABLET | ORAL | Status: DC
  Administered 2013-11-26 – 2013-11-27 (×2): 80 mg via ORAL
  Filled 2013-11-26 (×2): qty 1

## 2013-11-26 MED ORDER — TETANUS-DIPHTH-ACELL PERTUSSIS 5-2.5-18.5 LF-MCG/0.5 IM SUSP
0.5000 mL | Freq: Once | INTRAMUSCULAR | Status: AC
  Administered 2013-11-26: 0.5 mL via INTRAMUSCULAR
  Filled 2013-11-26: qty 0.5

## 2013-11-26 MED ORDER — FERROUS SULFATE 325 (65 FE) MG PO TABS
325.0000 mg | ORAL_TABLET | Freq: Two times a day (BID) | ORAL | Status: DC
  Administered 2013-11-27 – 2013-11-29 (×5): 325 mg via ORAL
  Filled 2013-11-26 (×5): qty 1

## 2013-11-26 MED ORDER — SCOPOLAMINE 1 MG/3DAYS TD PT72
MEDICATED_PATCH | TRANSDERMAL | Status: AC
  Filled 2013-11-26: qty 1

## 2013-11-26 MED ORDER — METOCLOPRAMIDE HCL 5 MG/ML IJ SOLN: 10.0000 mg | Freq: Three times a day (TID) | INTRAMUSCULAR | Status: DC | PRN

## 2013-11-26 MED ORDER — ONDANSETRON HCL 4 MG/2ML IJ SOLN: 4.0000 mg | INTRAMUSCULAR | Status: DC | PRN

## 2013-11-26 MED ORDER — FENTANYL 2.5 MCG/ML BUPIVACAINE 1/10 % EPIDURAL INFUSION (WH - ANES): 14.0000 mL/h | INTRAMUSCULAR | Status: DC | PRN

## 2013-11-26 MED ORDER — BUPIVACAINE IN DEXTROSE 0.75-8.25 % IT SOLN
INTRATHECAL | Status: DC | PRN
  Administered 2013-11-26: 1.6 mL via INTRATHECAL

## 2013-11-26 MED ORDER — LIDOCAINE HCL (PF) 1 % IJ SOLN
INTRAMUSCULAR | Status: DC | PRN
  Administered 2013-11-26 (×2): 8 mL

## 2013-11-26 MED ORDER — LACTATED RINGERS IV SOLN: 500.0000 mL | Freq: Once | INTRAVENOUS | Status: DC

## 2013-11-26 MED ORDER — OXYCODONE-ACETAMINOPHEN 5-325 MG PO TABS
1.0000 | ORAL_TABLET | ORAL | Status: DC | PRN
  Administered 2013-11-27 – 2013-11-29 (×5): 1 via ORAL
  Filled 2013-11-26 (×5): qty 1

## 2013-11-26 MED ORDER — ZOLPIDEM TARTRATE 5 MG PO TABS: 5.0000 mg | ORAL_TABLET | Freq: Every evening | ORAL | Status: DC | PRN

## 2013-11-26 MED ORDER — SIMETHICONE 80 MG PO CHEW
80.0000 mg | CHEWABLE_TABLET | Freq: Three times a day (TID) | ORAL | Status: DC
  Administered 2013-11-27 – 2013-11-29 (×6): 80 mg via ORAL
  Filled 2013-11-26 (×6): qty 1

## 2013-11-26 MED ORDER — SODIUM CHLORIDE 0.9 % IJ SOLN: 3.0000 mL | INTRAMUSCULAR | Status: DC | PRN

## 2013-11-26 MED ORDER — WITCH HAZEL-GLYCERIN EX PADS: 1.0000 "application " | MEDICATED_PAD | CUTANEOUS | Status: DC | PRN

## 2013-11-26 MED ORDER — OXYTOCIN 40 UNITS IN LACTATED RINGERS INFUSION - SIMPLE MED: 62.5000 mL/h | INTRAVENOUS | Status: DC

## 2013-11-26 MED ORDER — OXYTOCIN BOLUS FROM INFUSION: 500.0000 mL | INTRAVENOUS | Status: DC

## 2013-11-26 MED ORDER — LANOLIN HYDROUS EX OINT: 1.0000 "application " | TOPICAL_OINTMENT | CUTANEOUS | Status: DC | PRN

## 2013-11-26 MED ORDER — ONDANSETRON HCL 4 MG/2ML IJ SOLN: 4.0000 mg | Freq: Four times a day (QID) | INTRAMUSCULAR | Status: DC | PRN

## 2013-11-26 MED ORDER — CHLOROPROCAINE HCL 3 % IJ SOLN
INTRAMUSCULAR | Status: DC | PRN
  Administered 2013-11-26: 30 mg

## 2013-11-26 MED ORDER — FENTANYL CITRATE 0.05 MG/ML IJ SOLN: 100.0000 ug | INTRAMUSCULAR | Status: DC | PRN

## 2013-11-26 MED ORDER — EPHEDRINE 5 MG/ML INJ
10.0000 mg | INTRAVENOUS | Status: DC | PRN
  Filled 2013-11-26: qty 4

## 2013-11-26 MED ORDER — KETOROLAC TROMETHAMINE 30 MG/ML IJ SOLN: 30.0000 mg | Freq: Four times a day (QID) | INTRAMUSCULAR | Status: AC | PRN

## 2013-11-26 MED ORDER — DIBUCAINE 1 % RE OINT: 1.0000 "application " | TOPICAL_OINTMENT | RECTAL | Status: DC | PRN

## 2013-11-26 MED ORDER — PHENYLEPHRINE 40 MCG/ML (10ML) SYRINGE FOR IV PUSH (FOR BLOOD PRESSURE SUPPORT)
80.0000 ug | PREFILLED_SYRINGE | INTRAVENOUS | Status: DC | PRN
  Filled 2013-11-26: qty 10

## 2013-11-26 MED ORDER — ACETAMINOPHEN 325 MG PO TABS: 650.0000 mg | ORAL_TABLET | ORAL | Status: DC | PRN

## 2013-11-26 MED ORDER — FENTANYL CITRATE 0.05 MG/ML IJ SOLN
INTRAMUSCULAR | Status: DC | PRN
  Administered 2013-11-26: 25 ug via INTRATHECAL

## 2013-11-26 MED ORDER — SIMETHICONE 80 MG PO CHEW: 80.0000 mg | CHEWABLE_TABLET | ORAL | Status: DC | PRN

## 2013-11-26 MED ORDER — LIDOCAINE HCL (PF) 1 % IJ SOLN: 30.0000 mL | INTRAMUSCULAR | Status: DC | PRN

## 2013-11-26 MED ORDER — SENNOSIDES-DOCUSATE SODIUM 8.6-50 MG PO TABS
2.0000 | ORAL_TABLET | ORAL | Status: DC
  Administered 2013-11-26 – 2013-11-29 (×3): 2 via ORAL
  Filled 2013-11-26 (×3): qty 2

## 2013-11-26 MED ORDER — ONDANSETRON HCL 4 MG/2ML IJ SOLN
INTRAMUSCULAR | Status: DC | PRN
  Administered 2013-11-26: 4 mg via INTRAVENOUS

## 2013-11-26 MED ORDER — KETOROLAC TROMETHAMINE 30 MG/ML IJ SOLN
INTRAMUSCULAR | Status: AC
  Filled 2013-11-26: qty 1

## 2013-11-26 MED ORDER — KETOROLAC TROMETHAMINE 30 MG/ML IJ SOLN
30.0000 mg | Freq: Four times a day (QID) | INTRAMUSCULAR | Status: AC | PRN
  Administered 2013-11-26: 30 mg via INTRAMUSCULAR

## 2013-11-26 SURGICAL SUPPLY — 31 items
BENZOIN TINCTURE PRP APPL 2/3 (GAUZE/BANDAGES/DRESSINGS) ×3 IMPLANT
BLADE SURG ROTATE 9660 (MISCELLANEOUS) ×3 IMPLANT
CLAMP CORD UMBIL (MISCELLANEOUS) ×3 IMPLANT
CLIP FILSHIE TUBAL LIGA STRL (Clip) ×3 IMPLANT
CLOTH BEACON ORANGE TIMEOUT ST (SAFETY) ×3 IMPLANT
DRAPE LG THREE QUARTER DISP (DRAPES) ×3 IMPLANT
DRSG OPSITE POSTOP 4X10 (GAUZE/BANDAGES/DRESSINGS) ×3 IMPLANT
DURAPREP 26ML APPLICATOR (WOUND CARE) ×3 IMPLANT
ELECT REM PT RETURN 9FT ADLT (ELECTROSURGICAL) ×3
ELECTRODE REM PT RTRN 9FT ADLT (ELECTROSURGICAL) ×2 IMPLANT
GLOVE BIO SURGEON STRL SZ7 (GLOVE) ×3 IMPLANT
GOWN STRL REUS W/TWL LRG LVL3 (GOWN DISPOSABLE) ×15 IMPLANT
KIT ABG SYR 3ML LUER SLIP (SYRINGE) ×3 IMPLANT
NEEDLE HYPO 25X5/8 SAFETYGLIDE (NEEDLE) ×3 IMPLANT
NS IRRIG 1000ML POUR BTL (IV SOLUTION) ×3 IMPLANT
PACK C SECTION WH (CUSTOM PROCEDURE TRAY) ×6 IMPLANT
PAD OB MATERNITY 4.3X12.25 (PERSONAL CARE ITEMS) ×3 IMPLANT
RETRACTOR WND ALEXIS 25 LRG (MISCELLANEOUS) ×2 IMPLANT
RTRCTR WOUND ALEXIS 25CM LRG (MISCELLANEOUS) ×3
STRIP CLOSURE SKIN 1/2X4 (GAUZE/BANDAGES/DRESSINGS) ×3 IMPLANT
SUT MNCRL 0 VIOLET CTX 36 (SUTURE) ×6 IMPLANT
SUT MONOCRYL 0 CTX 36 (SUTURE) ×3
SUT PDS AB 0 CTX 60 (SUTURE) ×3 IMPLANT
SUT PLAIN 2 0 XLH (SUTURE) ×3 IMPLANT
SUT VIC AB 0 CT1 27 (SUTURE) ×2
SUT VIC AB 0 CT1 27XBRD ANBCTR (SUTURE) ×4 IMPLANT
SUT VIC AB 2-0 CT1 27 (SUTURE) ×1
SUT VIC AB 2-0 CT1 TAPERPNT 27 (SUTURE) ×2 IMPLANT
SUT VIC AB 4-0 KS 27 (SUTURE) ×3 IMPLANT
TOWEL OR 17X24 6PK STRL BLUE (TOWEL DISPOSABLE) ×9 IMPLANT
WATER STERILE IRR 1000ML POUR (IV SOLUTION) ×3 IMPLANT

## 2013-11-26 NOTE — OR Nursing (Signed)
Pt to OR with foley in place.  Foley clean, intact, hung below the level of the OR bed and foley is unclamped.

## 2013-11-26 NOTE — Progress Notes (Signed)
Pt comfortable.    FHTs now 140-150s with G stv and accels but now persistent late decels with almost every contraction. Toco q3 SVE 3/80/-2  D/W pt R/B/Alt and she agrees to proceed with LTCS for non-reassuring FHTs remote from vaginal delivery.  Pt also desires BTL for sterilization.

## 2013-11-26 NOTE — H&P (Signed)
38 y.o. [redacted]w[redacted]d [id number] in for induction at term.  Otherwise has good fetal movement and no bleeding.  Pt is longstanding pt of mine who has a very complicated history.  With her first full term pregnancy she had severe preeclampsia with SGA infant.  Her second pregnancy ended with a IUFD for unknown reason at 310weeks with early PIH but no preeclampsia.  She has had three MAB in first trimester after that.  She has been on heparin prophylactic dose for entire pregnancy at the suggestion of Infertility.  Now in this pregnancy she has unexplained polyhydramnios.  Baby has been varying from vtx to breech frequently over last few weeks.  [place] today showed VTX.  Past Medical History  Diagnosis Date  . Abnormal Pap smear   . History of LEEP (loop electrosurgical excision procedure) of cervix complicating pregnancy 26203 . Meningitis 1984  . Pregnancy induced hypertension 2010, 2012    History - resolved - no problems with current pregnancy 2015  . 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 Dilation and evacuation  12/18/2011    Procedure: DILATATION AND EVACUATION;  Surgeon: MDaria Pastures MD;  [facility];  Service: Gynecology;  Laterality: N/A;  . Dilation and curettage of uterus  11/2011,08/2010,12/2007,08/2007    x4  . Colposcopy  09/2004  . Cervical biopsy  w/ loop electrode excision  2006  . Wisdom tooth extraction      OB History  Gravida Para Term Preterm AB SAB TAB Ectopic Multiple Living  7 2 1 1 4 4  0 0 0 1    # Outcome Date GA Lbr Len/2nd Weight Sex Delivery Anes PTL Lv  7 CUR           6 SAB 11/2011 624w0d       5 PRE 04/26/11 3319w1d:00 / 00:36 1.72 kg (3 lb 12.7 oz) M BR EPI  SB     Comments: fetal demise  4 SAB 08/2010 8w074w0d     3 TRM 12/2008 39w05w0d08 kg (5 lb 12 oz) F SVD EPI  Y  2 SAB 12/2007 [redacted]w[redacted]d [redacted]w[redacted]d   1 SAB 08/2007 [redacted]w[redacted]d  [redacted]w[redacted]d      History   Social  History  . Marital Status: Married    Spouse Name: N/A    Number of Children: N/A  . Years of Education: N/A   Occupational History  . Not on file.   Social History Main Topics  . Smoking status: Never Smoker   . Smokeless tobacco: Never Used  . Alcohol Use: No  . Drug Use: No  . Sexual Activity: No     Comment: pregnant    Other Topics Concern  . Not on file   Social History Narrative  . No narrative on file   Review of patient's allergies indicates no known allergies.    Prenatal Transfer Tool  Maternal Diabetes: No Genetic Screening: Declined Maternal Ultrasounds/Referrals: Normal Fetal Ultrasounds or other Referrals:  None Maternal Substance Abuse:  No Significant Maternal Medications:  Meds include: Other: heparin Significant Maternal Lab Results: Lab values include: Other: carries one copy of MHTFR gene- on high dose folate  Other PNC: as above.    Filed Vitals:   11/26/13 1112 11/26/13 1116 11/26/13 1117  11/26/13 1130  BP: 126/80  122/79 143/87  Pulse: 72  72 81  Temp:      TempSrc:      Resp: 20  18 20   Height:      Weight:      SpO2:  98%      Lungs/Cor:  NAD Abdomen:  soft, gravid Ex:  no cords, erythema SVE:  3/70/-2, AROM clear and head moved down FHTs:  130s, good STV, NST R; occ mild variable and one moderate variable- will watch. Toco:  q3-5   A/P   Term with polyhydramnios and significant prior pregnancy hx.  GBS Neg.  Induction.  Daria Pastures

## 2013-11-26 NOTE — Brief Op Note (Signed)
11/26/2013  3:03 PM  PATIENT:  Teresa Wong  38 y.o. female  PRE-OPERATIVE DIAGNOSIS:  non-reassuring fetal heart tones, polyhydramnios, desires sterilization  POST-OPERATIVE DIAGNOSIS:  non-reassuring fetal heart tones, polyhydramnios, desires sterilization  PROCEDURE:  Procedure(s): CESAREAN SECTION (N/A) BILATERAL TUBAL LIGATION (Bilateral)  SURGEON:  Surgeon(s) and Role:    * Daria Pastures, MD - Primary  ANESTHESIA:   spinal  EBL:  Total I/O In: 1000 [I.V.:1000] Out: 800 [Urine:200; Blood:600]  SPECIMEN:  Source of Specimen:  placenta  DISPOSITION OF SPECIMEN:  PATHOLOGY  COUNTS:  YES  TOURNIQUET:  * No tourniquets in log *  DICTATION: .Note written in EPIC  PLAN OF CARE: Admit to inpatient   PATIENT DISPOSITION:  PACU - hemodynamically stable.   Delay start of Pharmacological VTE agent (>24hrs) due to surgical blood loss or risk of bleeding: not applicable  Complications:  None. Medications:  Ancef, Pitocin Findings:  Baby female, Apgars 8,9, weight P.   Normal tubes, ovaries and uterus seen.  Baby was skin to skin with mother after birth in the OR.  Technique:  Initial test with allis was inadequate pain control with epidural.  After redosing, pain control was still not good.  FHTs were checked and pt sat up for a spinal.  FHTs were checked again and were were able to proceed. After adequate s anesthesia was achieved, the patient was prepped and draped in usual sterile fashion.  A foley catheter was used to drain the bladder.  A pfannanstiel incision was made with the scalpel and carried down to the fascia with the bovie cautery. The fascia was incised in the midline with the scalpel and carried in a transverse curvilinear manner bilaterally.  The fascia was reflected superiorly and inferiorly off the rectus muscles and the muscles split in the midline.  A bowel free portion of the peritoneum was entered bluntly and then extended in a superior and inferior  manner with good visualization of the bowel and bladder.  The Alexis instrument was then placed and the vesico-uterine fascia tented up and incised in a transverse curvilinear manner.  A 2 cm transverse incision was made in the upper portion of the lower uterine segment until the amnion was exposed.   The incision was extended transversely in a blunt manner.  Clear fluid was noted and the baby delivered in the vertex presentation without complication.  The baby was bulb suctioned and the cord was clamped and cut.  The baby was then handed to awaiting Neonatology.  The placenta was then delivered manually and the uterus cleared of all debris.  The uterine incision was then closed with a running lock stitch of 0 monocryl.  An imbricating layer of 0 monocryl was closed as well. Excellent hemostasis of the uterine incision was achieved and the abdomen was cleared with irrigation.  Each fallopian tube was then identfied and followed to the fimbriated ends- each tube was then occluded with a single filchie clip on each tube.  The peritoneum was closed with a running stitch of 2-0 vicryl.  This incorporated the rectus muscles as a separate layer.  The fascia was then closed with a running stitch of 0 vicryl.  The subcutaneous layer was closed with interrupted  stitches of 2-0 plain gut.  The skin was closed with 4-0 vicryl on a Keith needle and steri-strips.  The patient tolerated the procedure well and was returned to the recovery room in stable condition.  All counts were correct times three.  Daria Pastures

## 2013-11-26 NOTE — Anesthesia Preprocedure Evaluation (Addendum)
Anesthesia Evaluation  Patient identified by MRN, date of birth, ID band Patient awake    Reviewed: Allergy & Precautions, H&P , NPO status , Patient's Chart, lab work & pertinent test results  Airway Mallampati: II TM Distance: >3 FB Neck ROM: full    Dental no notable dental hx.    Pulmonary neg pulmonary ROS,    Pulmonary exam normal       Cardiovascular hypertension, Rhythm:regular Rate:Normal     Neuro/Psych negative neurological ROS  negative psych ROS   GI/Hepatic negative GI ROS, Neg liver ROS,   Endo/Other  negative endocrine ROS  Renal/GU negative Renal ROS     Musculoskeletal   Abdominal Normal abdominal exam  (+)   Peds  Hematology negative hematology ROS (+)   Anesthesia Other Findings   Reproductive/Obstetrics (+) Pregnancy                          Anesthesia Physical Anesthesia Plan  ASA: II and emergent  Anesthesia Plan: Epidural   Post-op Pain Management:    Induction:   Airway Management Planned: Natural Airway  Additional Equipment:   Intra-op Plan:   Post-operative Plan:   Informed Consent: I have reviewed the patients History and Physical, chart, labs and discussed the procedure including the risks, benefits and alternatives for the proposed anesthesia with the patient or authorized representative who has indicated his/her understanding and acceptance.     Plan Discussed with: CRNA, Anesthesiologist and Surgeon  Anesthesia Plan Comments: (Patient for urgent C/Section for non reassuring FHR tracing. Will use epidural for C/Section. M. Royce Macadamia, MD)       Anesthesia Quick Evaluation

## 2013-11-26 NOTE — Transfer of Care (Signed)
Immediate Anesthesia Transfer of Care Note  Patient: Teresa Wong  Procedure(s) Performed: Procedure(s): CESAREAN SECTION (N/A) BILATERAL TUBAL LIGATION (Bilateral)  Patient Location: PACU  Anesthesia Type:Spinal  Level of Consciousness: awake, alert , oriented and patient cooperative  Airway & Oxygen Therapy: Patient Spontanous Breathing  Post-op Assessment: Report given to PACU RN and Post -op Vital signs reviewed and stable  Post vital signs: Reviewed and stable  Complications: No apparent anesthesia complications

## 2013-11-26 NOTE — Lactation Note (Signed)
This note was copied from the chart of [place]. Lactation Consultation Note  Patient Name: Teresa Wong [id number] Date: 11/26/2013 Reason for consult: Initial assessment of this second-time mom and her newborn at 17 hours postpartum. Mom attempted and partially breastfed her 36 yo daughter for first 3 weeks of her life but then exclusively pumped and fed mostly ebm for 6 months.  First baby weighed 5 pounds and needed close follow-up for weight gain and feeding difficulty but mom states her newborn son has already latched and nursed 3 times since birth.  Initial LATCH score=8.  Mom states that she knows how to hand express her milk and LC reviewed reasons for hand expression.  LC encouraged STS and cue feedings. LC encouraged review of Baby and Me pp 9, 14 and 20-25 for STS and BF information. LC provided [employer] brochure and reviewed [facility] services and list of community and web site resources.      Maternal Data Formula Feeding for Exclusion: No Infant to breast within first hour of birth: Yes (initial LATCH score=8 after delivery) Has patient been taught Hand Expression?: Yes (mom states that she knows how to hand express) Does the patient have breastfeeding experience prior to this delivery?: Yes  Feeding Feeding Type: Breast Fed Length of feed: 0 min  LATCH Score/Interventions         Initial LATCH score=8             Lactation Tools Discussed/Used   STS, hand expression, cue feedings  Consult Status Consult Status: Follow-up Date: 11/27/13 Follow-up type: In-patient    Landis Gandy 11/26/2013, 9:26 PM

## 2013-11-26 NOTE — Anesthesia Postprocedure Evaluation (Signed)
  Anesthesia Post-op Note  Patient: Teresa Wong  Procedure(s) Performed: Procedure(s): CESAREAN SECTION (N/A) BILATERAL TUBAL LIGATION (Bilateral)  Patient Location: PACU  Anesthesia Type:Spinal  Level of Consciousness: awake, alert  and oriented  Airway and Oxygen Therapy: Patient Spontanous Breathing  Post-op Pain: none  Post-op Assessment: Post-op Vital signs reviewed, Patient's Cardiovascular Status Stable, Respiratory Function Stable, Patent Airway, No signs of Nausea or vomiting, Pain level controlled, No headache and No backache  Post-op Vital Signs: Reviewed and stable  LaComplications: No apparent anesthesia complications

## 2013-11-26 NOTE — Op Note (Signed)
11/26/2013  3:03 PM  PATIENT:  Teresa Wong  37 y.o. female  PRE-OPERATIVE DIAGNOSIS:  non-reassuring fetal heart tones, polyhydramnios, desires sterilization  POST-OPERATIVE DIAGNOSIS:  non-reassuring fetal heart tones, polyhydramnios, desires sterilization  PROCEDURE:  Procedure(s): CESAREAN SECTION (N/A) BILATERAL TUBAL LIGATION (Bilateral)  SURGEON:  Surgeon(s) and Role:    * Daria Pastures, MD - Primary  ANESTHESIA:   spinal  EBL:  Total I/O In: 1000 [I.V.:1000] Out: 800 [Urine:200; Blood:600]  SPECIMEN:  Source of Specimen:  placenta  DISPOSITION OF SPECIMEN:  PATHOLOGY  COUNTS:  YES  TOURNIQUET:  * No tourniquets in log *  DICTATION: .Note written in EPIC  PLAN OF CARE: Admit to inpatient   PATIENT DISPOSITION:  PACU - hemodynamically stable.   Delay start of Pharmacological VTE agent (>24hrs) due to surgical blood loss or risk of bleeding: not applicable  Complications:  None. Medications:  Ancef, Pitocin Findings:  Baby female, Apgars 8,9, weight P.   Normal tubes, ovaries and uterus seen.  Baby was skin to skin with mother after birth in the OR.  Technique:  Initial test with allis was inadequate pain control with epidural.  After redosing, pain control was still not good.  FHTs were checked and pt sat up for a spinal.  FHTs were checked again and were were able to proceed. After adequate s anesthesia was achieved, the patient was prepped and draped in usual sterile fashion.  A foley catheter was used to drain the bladder.  A pfannanstiel incision was made with the scalpel and carried down to the fascia with the bovie cautery. The fascia was incised in the midline with the scalpel and carried in a transverse curvilinear manner bilaterally.  The fascia was reflected superiorly and inferiorly off the rectus muscles and the muscles split in the midline.  A bowel free portion of the peritoneum was entered bluntly and then extended in a superior and inferior  manner with good visualization of the bowel and bladder.  The Alexis instrument was then placed and the vesico-uterine fascia tented up and incised in a transverse curvilinear manner.  A 2 cm transverse incision was made in the upper portion of the lower uterine segment until the amnion was exposed.   The incision was extended transversely in a blunt manner.  Clear fluid was noted and the baby delivered in the vertex presentation without complication.  The baby was bulb suctioned and the cord was clamped and cut.  The baby was then handed to awaiting Neonatology.  The placenta was then delivered manually and the uterus cleared of all debris.  The uterine incision was then closed with a running lock stitch of 0 monocryl.  An imbricating layer of 0 monocryl was closed as well. Excellent hemostasis of the uterine incision was achieved and the abdomen was cleared with irrigation.  Each fallopian tube was then identfied and followed to the fimbriated ends- each tube was then occluded with a single filchie clip on each tube.  The peritoneum was closed with a running stitch of 2-0 vicryl.  This incorporated the rectus muscles as a separate layer.  The fascia was then closed with a running stitch of 0 vicryl.  The subcutaneous layer was closed with interrupted  stitches of 2-0 plain gut.  The skin was closed with 4-0 vicryl on a Keith needle and steri-strips.  The patient tolerated the procedure well and was returned to the recovery room in stable condition.  All counts were correct times three.  Daria Pastures

## 2013-11-26 NOTE — Anesthesia Procedure Notes (Addendum)
Epidural Patient location during procedure: OB Start time: 11/26/2013 10:39 AM End time: 11/26/2013 10:43 AM  Staffing Anesthesiologist: Lyn Hollingshead Performed by: anesthesiologist   Preanesthetic Checklist Completed: patient identified, surgical consent, pre-op evaluation, timeout performed, IV checked, risks and benefits discussed and monitors and equipment checked  Epidural Patient position: sitting Prep: site prepped and draped and DuraPrep Patient monitoring: continuous pulse ox and blood pressure Approach: midline Location: L3-L4 Injection technique: LOR air  Needle:  Needle type: Tuohy  Needle gauge: 17 G Needle length: 9 cm and 9 Needle insertion depth: 5 cm cm Catheter type: closed end flexible Catheter size: 19 Gauge Catheter at skin depth: 10 cm Test dose: negative and Other  Assessment Sensory level: T9 Events: blood not aspirated, injection not painful, no injection resistance, negative IV test and no paresthesia  Additional Notes Reason for block:procedure for pain  Spinal  Patient location during procedure: OR Start time: 11/26/2013 2:17 PM Staffing Anesthesiologist: Jazmyn Offner A. Performed by: anesthesiologist  Preanesthetic Checklist Completed: patient identified, site marked, surgical consent, pre-op evaluation, timeout performed, IV checked, risks and benefits discussed and monitors and equipment checked Spinal Block Patient position: sitting Prep: site prepped and draped and DuraPrep Patient monitoring: heart rate, cardiac monitor, continuous pulse ox and blood pressure Approach: midline Location: L3-4 Injection technique: single-shot Needle Needle type: Sprotte  Needle gauge: 24 G Needle length: 9 cm Needle insertion depth: 6 cm Assessment Sensory level: T4 Additional Notes Patient tolerated procedure well. Adequate sensory level.

## 2013-11-27 ENCOUNTER — Encounter (HOSPITAL_COMMUNITY): Payer: Self-pay | Admitting: Obstetrics and Gynecology

## 2013-11-27 LAB — CBC
HEMATOCRIT: 31.2 % — AB (ref 36.0–46.0)
HEMOGLOBIN: 10.8 g/dL — AB (ref 12.0–15.0)
MCH: 31 pg (ref 26.0–34.0)
MCHC: 34.6 g/dL (ref 30.0–36.0)
MCV: 89.7 fL (ref 78.0–100.0)
Platelets: 180 10*3/uL (ref 150–400)
RBC: 3.48 MIL/uL — AB (ref 3.87–5.11)
RDW: 14.4 % (ref 11.5–15.5)
WBC: 9.5 10*3/uL (ref 4.0–10.5)

## 2013-11-27 NOTE — Progress Notes (Signed)
POD#1 Pt without complaints. Baby doing well. She would like baby to have circ. VSSAF IMP/ Doing well Plan/ Routine care.

## 2013-11-27 NOTE — Addendum Note (Signed)
Addendum created 11/27/13 0802 by Tobin Chad, CRNA   Modules edited: Notes Section   Notes Section:  File: [id number]

## 2013-11-27 NOTE — Anesthesia Postprocedure Evaluation (Signed)
  Anesthesia Post-op Note  Patient: Teresa Wong  Procedure(s) Performed: Procedure(s): CESAREAN SECTION (N/A) BILATERAL TUBAL LIGATION (Bilateral)  Patient Location: Mother/Baby  Anesthesia Type:Epidural  Level of Consciousness: awake, alert , oriented and patient cooperative  Airway and Oxygen Therapy: Patient Spontanous Breathing  Post-op Pain: none  Post-op Assessment: Patient's Cardiovascular Status Stable, Respiratory Function Stable, Patent Airway, No signs of Nausea or vomiting, Adequate PO intake, Pain level controlled, No headache, No backache, No residual numbness and No residual motor weakness  Post-op Vital Signs: Reviewed and stable  Last Vitals:  Filed Vitals:   11/27/13 0635  BP: 100/54  Pulse: 80  Temp: 36.8 C  Resp: 18    Complications: No apparent anesthesia complications

## 2013-11-27 NOTE — Lactation Note (Signed)
This note was copied from the chart of [place]. Lactation Consultation Note  Patient Name: Teresa Wong [id number] Date: 11/27/2013 Reason for consult: Follow-up assessment;NICU baby Teresa Wong has been transferred to NICU due to low blood sugar. Set Mom up to pump every 3 hours on preemie setting using DEBP. Demonstrated hand expression and received approx 1 ml of EBM. Reviewed cleaning pump parts and storage for NICU baby. NICU booklet given to Mom to review. Encouraged to call for questions or concerns.   Maternal Data    Feeding    LATCH Score/Interventions                      Lactation Tools Discussed/Used Tools: Pump Breast pump type: Double-Electric Breast Pump Pump Review: Setup, frequency, and cleaning;Milk Storage Initiated by:: KG Date initiated:: 11/27/13   Consult Status Consult Status: Follow-up Date: 11/28/13 Follow-up type: In-patient    Teresa Wong 11/27/2013, 2:44 PM

## 2013-11-27 NOTE — Lactation Note (Signed)
This note was copied from the chart of [place]. Lactation Consultation Note      Follow up consult with this mom of a NICU baby, term, on HFNC for abnormal xray and oxygen requirements. Baby is 22 1/7 weeks corrected gestation, and 23 hours post partum. He was just admitted to NICU. Mom and dad crying and visibly upset. Mom gave birth to a [redacted] week gestation baby, who later died(reason not known at this time). Mom has a 5 year, who she breast fed "some". I briefly introduced myself to mom and dad, gave them breast milk label, and instructed her in how to use. Mom was seen by lactation prior to baby coming to NICU, and brought about 1 ml of colostrum with her.    Patient Name: Teresa Wong Date: 11/27/2013 Reason for consult: Follow-up assessment;NICU baby   Maternal Data    Feeding    LATCH Score/Interventions                      Lactation Tools Discussed/Used Tools: Pump Breast pump type: Double-Electric Breast Pump Pump Review: Setup, frequency, and cleaning;Milk Storage Initiated by:: KG Date initiated:: 11/27/13   Consult Status Consult Status: Follow-up Date: 11/28/13 Follow-up type: In-patient    Tonna Corner 11/27/2013, 4:18 PM

## 2013-11-28 NOTE — Progress Notes (Signed)
POD#2 Pt not in room. In NICU. Will check later.

## 2013-11-29 MED ORDER — OXYCODONE-ACETAMINOPHEN 5-325 MG PO TABS: 1.0000 | ORAL_TABLET | ORAL | Status: DC | PRN

## 2013-11-29 NOTE — Discharge Summary (Signed)
Obstetric Discharge Summary Reason for Admission: induction of labor Prenatal Procedures: ultrasound Intrapartum Procedures: cesarean: low cervical, transverse Postpartum Procedures: none Complications-Operative and Postpartum: none Hemoglobin  Date Value Ref Range Status  11/27/2013 10.8* 12.0 - 15.0 g/dL Final     HCT  Date Value Ref Range Status  11/27/2013 31.2* 36.0 - 46.0 % Final    Physical Exam:  General: alert Lochia: appropriate Uterine Fundus: firm Incision: healing well DVT Evaluation: No evidence of DVT seen on physical exam.  Discharge Diagnoses: Term Pregnancy-delivered  Discharge Information: Date: 11/29/2013 Activity: pelvic rest Diet: routine Medications: PNV, Ibuprofen and Percocet Condition: stable Instructions: refer to practice specific booklet Discharge to: home Follow-up Information   Follow up with HORVATH,MICHELLE A, MD In 2 weeks.   Specialty:  Obstetrics and Gynecology   Contact information:   [place] [address] [phone]       Newborn Data: Live born female  Birth Weight: 6 lb 14.9 oz (3145 g) APGAR: 8, 9  Home with mother.  Olga Millers 11/29/2013, 10:11 AM

## 2013-11-29 NOTE — Progress Notes (Signed)
POD#3 Pt doing well. In NICU at this time. VSSAF IMP/ Stable Plan/ Will discharge to home.

## 2013-12-07 ENCOUNTER — Ambulatory Visit: Payer: Self-pay

## 2013-12-07 NOTE — Lactation Note (Signed)
This note was copied from the chart of Teresa Wong. Lactation Consultation Note     Follow up[ consult with this mom and baby, now 8 days old, and 39 4/7 weeks corrected gestation. The baby's one touches have stabilized, and mom is putting him to the breast for the first time in 10 days. He latched and suckled easily, needed stimulation to continue sucking. I showed mom how to bring the baby to her, not her breast to baby. Mom thrilled she is now able to breast feed. I tld mom we would do a pre and post weight tomorrow, to see what he is transferring.   Patient Name: [name] [id number] Date: 12/07/2013 Reason for consult: Follow-up assessment;NICU baby   Maternal Data    Feeding Feeding Type: Breast Fed Nipple Type: Regular Length of feed: 30 min  LATCH Score/Interventions Latch: Repeated attempts needed to sustain latch, nipple held in mouth throughout feeding, stimulation needed to elicit sucking reflex. (needed botttom ip pulled out) Intervention(s): Adjust position;Assist with latch;Breast massage;Breast compression  Audible Swallowing: A few with stimulation  Type of Nipple: Everted at rest and after stimulation (long nipples - easy for baby to stay shallow and suck nipple only)  Comfort (Breast/Nipple): Soft / non-tender     Hold (Positioning): Assistance needed to correctly position infant at breast and maintain latch. Intervention(s): Breastfeeding basics reviewed;Support Pillows;Position options;Skin to skin  LATCH Score: 7  Lactation Tools Discussed/Used     Consult Status Consult Status: Follow-up Date: 12/08/13 Follow-up type: In-patient    Tonna Corner 12/07/2013, 4:15 PM

## 2014-02-10 ENCOUNTER — Emergency Department
Admission: EM | Admit: 2014-02-10 | Discharge: 2014-02-10 | Disposition: A | Discharge status: Home / Self Care | Payer: 59 | Source: Home / Self Care | Attending: Emergency Medicine | Admitting: Emergency Medicine

## 2014-02-10 ENCOUNTER — Encounter: Payer: Self-pay | Admitting: Emergency Medicine

## 2014-02-10 DIAGNOSIS — J209 Acute bronchitis, unspecified: Secondary | ICD-10-CM

## 2014-02-10 MED ORDER — AZITHROMYCIN 250 MG PO TABS: ORAL_TABLET | ORAL | Status: DC

## 2014-02-10 NOTE — ED Notes (Signed)
Pt c/o chest congestion, productive cough and  post nasal drip x 4 days. Denies fever.

## 2014-02-10 NOTE — ED Provider Notes (Signed)
CSN: [id number]     Arrival date & time 02/10/14  0815 History   First MD Initiated Contact with Patient 02/10/14 0820     Chief Complaint  Patient presents with  . Cough   (Consider location/radiation/quality/duration/timing/severity/associated sxs/prior Treatment) HPI URI HISTORY  Teresa Wong is a 38 y.o. female who complains of onset of cough and cold symptoms for 5 days, progressively worsening the past 2 days.  Have been using over-the-counter treatment which helps minimally  No chills/sweats + Low-grade Fever  +  Nasal congestion +  Minimal Discolored Post-nasal drainage No sinus pain/pressure Minimal sore throat  +  Cough,  No wheezing Mild chest congestion No hemoptysis No shortness of breath No pleuritic pain  No itchy/red eyes No earache  No nausea No vomiting No abdominal pain No diarrhea  No skin rashes +  Fatigue No myalgias No headache   Past Medical History  Diagnosis Date  . Abnormal Pap smear   . History of LEEP (loop electrosurgical excision procedure) of cervix complicating pregnancy 2130  . Meningitis 1984  . Pregnancy induced hypertension 2010, 2012    History - resolved - no problems with current pregnancy 2015  . 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 Dilation and evacuation  12/18/2011    Procedure: DILATATION AND EVACUATION;  Surgeon: Daria Pastures, MD;  Location: [facility];  Service: Gynecology;  Laterality: N/A;  . Dilation and curettage of uterus  11/2011,08/2010,12/2007,08/2007    x4  . Colposcopy  09/2004  . Cervical biopsy  w/ loop electrode excision  2006  . Wisdom tooth extraction    . Cesarean section N/A 11/26/2013    Procedure: CESAREAN SECTION;  Surgeon: Daria Pastures, MD;  Location: [facility];  Service: Obstetrics;  Laterality: N/A;  . Tubal ligation Bilateral 11/26/2013    Procedure: BILATERAL TUBAL LIGATION;  Surgeon: Daria Pastures,  MD;  Location: [facility];  Service: Obstetrics;  Laterality: Bilateral;   Family History  Problem Relation Age of Onset  . Hypertension Mother    History  Substance Use Topics  . Smoking status: Never Smoker   . Smokeless tobacco: Never Used  . Alcohol Use: No   OB History   Grav Para Term Preterm Abortions TAB SAB Ect Mult Living   7 3 2 1 4  0 4 0 0 2     Review of Systems  All other systems reviewed and are negative.   Allergies  Review of patient's allergies indicates no known allergies.  Home Medications   Prior to Admission medications   Medication Sig Start Date End Date Taking? Authorizing Provider  azithromycin (ZITHROMAX Z-PAK) 250 MG tablet Take 2 tablets on day one, then 1 tablet daily on days 2 through 5 02/10/14   Jacqulyn Cane, MD  folic acid (FOLVITE) 1 MG tablet Take 3 mg by mouth daily.    Historical Provider, MD  prenatal vitamin w/FE, FA (PRENATAL 1 + 1) 27-1 MG TABS Take 1 tablet by mouth daily.     Historical Provider, MD   BP 132/82  Pulse 83  Temp(Src) 98.5 F (36.9 C) (Oral)  Resp 16  Wt 195 lb (88.451 kg)  SpO2 96% Physical Exam  Nursing note and vitals reviewed. Constitutional: She is oriented to person, place, and time. She appears well-developed and well-nourished. No distress.  HENT:  Head: Normocephalic and atraumatic.  Right Ear: Tympanic membrane  normal.  Left Ear: Tympanic membrane normal.  Mouth/Throat: Oropharynx is clear and moist. No oropharyngeal exudate.  Nose: Mild congestion, mild seromucoid drainage  Eyes: Right eye exhibits no discharge. Left eye exhibits no discharge. No scleral icterus.  Neck: Neck supple.  Cardiovascular: Normal rate, regular rhythm and normal heart sounds.   Pulmonary/Chest: No respiratory distress. She has no wheezes. She has rhonchi. She has no rales.  Lymphadenopathy:    She has no cervical adenopathy.  Neurological: She is alert and oriented to person, place, and time.  Skin: Skin is warm and dry.     ED Course  Procedures (including critical care time) Labs Review Labs Reviewed - No data to display  Imaging Review No results found.   MDM   1. Acute bronchitis, unspecified organism    Treatment options discussed, as well as risks, benefits, alternatives. Patient voiced understanding and agreement with the following plans: Zithromax Z-PAK. Delsym, other OTC symptomatic care discussed. She declined any prescription cough med (Reviewed that she is no longer breast-feeding her 3 month infant )  Follow-up with your primary care doctor in 5-7 days if not improving, or sooner if symptoms become worse. Precautions discussed. Red flags discussed. Questions invited and answered. Patient voiced understanding and agreement.     Jacqulyn Cane, MD 02/10/14 [phone]

## 2014-05-31 ENCOUNTER — Encounter: Payer: Self-pay | Admitting: Emergency Medicine

## 2014-07-16 LAB — HM PAP SMEAR: HM Pap smear: NEGATIVE

## 2014-09-30 ENCOUNTER — Encounter (HOSPITAL_COMMUNITY): Payer: Self-pay | Admitting: Emergency Medicine

## 2014-09-30 ENCOUNTER — Emergency Department (HOSPITAL_COMMUNITY)
Admission: EM | Admit: 2014-09-30 | Discharge: 2014-09-30 | Disposition: A | Discharge status: Home / Self Care | Payer: 59 | Source: Home / Self Care

## 2014-09-30 DIAGNOSIS — J029 Acute pharyngitis, unspecified: Secondary | ICD-10-CM

## 2014-09-30 LAB — POCT RAPID STREP A: Streptococcus, Group A Screen (Direct): NEGATIVE

## 2014-09-30 MED ORDER — AMOXICILLIN 500 MG PO CAPS: 500.0000 mg | ORAL_CAPSULE | Freq: Two times a day (BID) | ORAL | Status: DC

## 2014-09-30 MED ORDER — FLUCONAZOLE 150 MG PO TABS: 150.0000 mg | ORAL_TABLET | Freq: Every day | ORAL | Status: DC

## 2014-09-30 NOTE — ED Notes (Signed)
Patient has sore throat, chills, "skin hurts".  Patient concerned for strep, reports daughter is currently being treated for strep.

## 2014-09-30 NOTE — ED Provider Notes (Signed)
CSN: [id number]     Arrival date & time 09/30/14  1456 History   None    Chief Complaint  Patient presents with  . Fever   (Consider location/radiation/quality/duration/timing/severity/associated sxs/prior Treatment) HPI  Sore throat. Started 2 days ago. Associated w/ fever, chills, body aches, HA. Minimal congestion. chloroacetic spray and ibuprofen 831m w/o much benefit. Symptoms are getting worse and are constant. Daughter Dx w/ strep throat 7 days ago.   Past Medical History  Diagnosis Date  . Abnormal Pap smear   . History of LEEP (loop electrosurgical excision procedure) of cervix complicating pregnancy 23154 . Meningitis 1984  . Pregnancy induced hypertension 2010, 2012    History - resolved - no problems with current pregnancy 2015  . 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 Dilation and evacuation  12/18/2011    Procedure: DILATATION AND EVACUATION;  Surgeon: MDaria Pastures MD;  [facility];  Service: Gynecology;  Laterality: N/A;  . Dilation and curettage of uterus  11/2011,08/2010,12/2007,08/2007    x4  . Colposcopy  09/2004  . Cervical biopsy  w/ loop electrode excision  2006  . Wisdom tooth extraction    . Cesarean section N/A 11/26/2013    Procedure: CESAREAN SECTION;  Surgeon: MDaria Pastures MD;  [facility];  Service: Obstetrics;  Laterality: N/A;  . Tubal ligation Bilateral 11/26/2013    Procedure: BILATERAL TUBAL LIGATION;  Surgeon: MDaria Pastures MD;  [facility];  Service: Obstetrics;  Laterality: Bilateral;   Family History  Problem Relation Age of Onset  . Hypertension Mother   . CAD Father    History  Substance Use Topics  . Smoking status: Never Smoker   . Smokeless tobacco: Never Used  . Alcohol Use: No   OB History    Gravida Para Term Preterm AB TAB SAB Ectopic Multiple Living   7 3 2 1 4  0 4 0 0 2     Review of Systems  Per HPI  with all other pertinent systems negative.   Allergies  Review of patient's allergies indicates no known allergies.  Home Medications   Prior to Admission medications   Medication Sig Start Date End Date Taking? Authorizing Provider  FLUoxetine (PROZAC) 20 MG capsule Take 20 mg by mouth daily.   Yes Historical Provider, MD  amoxicillin (AMOXIL) 500 MG capsule Take 1 capsule (500 mg total) by mouth 2 (two) times daily. 09/30/14   DWaldemar Dickens MD  azithromycin (ZITHROMAX Z-PAK) 250 MG tablet Take 2 tablets on day one, then 1 tablet daily on days 2 through 5 Patient not taking: Reported on 09/30/2014 02/10/14   DJacqulyn Cane MD  fluconazole (DIFLUCAN) 150 MG tablet Take 1 tablet (150 mg total) by mouth daily. Repeat dose in 3 days 09/30/14   DWaldemar Dickens MD   BP 126/80 mmHg  Pulse 117  Temp(Src) 100.4 F (38 C) (Oral)  Resp 18  SpO2 98%  LMP 09/24/2014 Physical Exam  Constitutional: She is oriented to person, place, and time. She appears well-developed and well-nourished.  HENT:  Head: Normocephalic and atraumatic.  Pharynx injected,  Eyes: EOM are normal. Pupils are equal, round, and reactive to light.  Neck: Normal range of motion. No tracheal deviation present. No thyromegaly present.  Cardiovascular: Normal rate and normal heart sounds.   No murmur heard. Pulmonary/Chest: Effort normal and breath sounds normal.  Abdominal: Soft. Bowel sounds are normal.  Musculoskeletal: Normal range of motion.  Lymphadenopathy:    She has cervical adenopathy.  Neurological: She is alert and oriented to person, place, and time.  Skin: Skin is warm.  Psychiatric: She has a normal mood and affect. Her behavior is normal. Judgment and thought content normal.    ED Course  Procedures (including critical care time) Labs Review Labs Reviewed  POCT RAPID STREP A ([place])    Imaging Review No results found.   MDM   1. Pharyngitis    Rapid strep negative. Distal fibular  strep throat diagnoses given prescription symptoms and family member with strep throat in the home. We'll send for strep culture. Start amoxicillin. Diflucan given if develops yeast infection Precautions given and all questions answered  Linna Darner, MD Family Medicine 09/30/2014, 4:46 PM     Waldemar Dickens, MD 09/30/14 [phone]

## 2014-09-30 NOTE — Discharge Instructions (Signed)
You likely have strep throat Please start the amoxicillin We will culture your throat sample Please use the diflucan if you get a yeast infection Please use tylenol and ibuprofen for pain and fever.

## 2014-10-03 LAB — CULTURE, GROUP A STREP: Strep A Culture: POSITIVE — AB

## 2014-10-05 ENCOUNTER — Telehealth (HOSPITAL_COMMUNITY): Payer: Self-pay | Admitting: *Deleted

## 2014-10-05 NOTE — ED Notes (Signed)
Throat culture: Group A strep.  I called pt. Pt. verified x 2 and given result.  Pt. told she was adequately treated with Amoxicillin and to finish all of the medication.  If anyone she exposed gets the same symptoms, should get checked for strep.  If she is not better to get rechecked. She said she is feeling much better. Roselyn Meier 10/05/2014

## 2014-11-26 ENCOUNTER — Encounter: Payer: Self-pay | Admitting: Nurse Practitioner

## 2014-11-26 ENCOUNTER — Ambulatory Visit (INDEPENDENT_AMBULATORY_CARE_PROVIDER_SITE_OTHER): Payer: 59 | Admitting: Nurse Practitioner

## 2014-11-26 VITALS — BP 119/82 | HR 70 | Temp 98.0°F | Ht 67.0 in | Wt 201.0 lb

## 2014-11-26 DIAGNOSIS — Z6831 Body mass index (BMI) 31.0-31.9, adult: Secondary | ICD-10-CM

## 2014-11-26 DIAGNOSIS — Z0000 Encounter for general adult medical examination without abnormal findings: Secondary | ICD-10-CM

## 2014-11-26 LAB — LIPID PANEL
CHOL/HDL RATIO: 4
Cholesterol: 185 mg/dL (ref 0–200)
HDL: 46.4 mg/dL (ref >39.00)
LDL Cholesterol: 127 mg/dL — ABNORMAL HIGH (ref 0–99)
NONHDL: 138.6
Triglycerides: 57 mg/dL (ref 0.0–149.0)
VLDL: 11.4 mg/dL (ref 0.0–40.0)

## 2014-11-26 LAB — CBC WITH DIFFERENTIAL/PLATELET
Basophils Absolute: 0 10*3/uL (ref 0.0–0.1)
Basophils Relative: 0.9 % (ref 0.0–3.0)
Eosinophils Absolute: 0.2 10*3/uL (ref 0.0–0.7)
Eosinophils Relative: 4.5 % (ref 0.0–5.0)
HCT: 39.2 % (ref 36.0–46.0)
Hemoglobin: 13.3 g/dL (ref 12.0–15.0)
Lymphocytes Relative: 37.9 % (ref 12.0–46.0)
Lymphs Abs: 2.1 10*3/uL (ref 0.7–4.0)
MCHC: 33.9 g/dL (ref 30.0–36.0)
MCV: 86.5 fl (ref 78.0–100.0)
Monocytes Absolute: 0.3 10*3/uL (ref 0.1–1.0)
Monocytes Relative: 6.2 % (ref 3.0–12.0)
Neutro Abs: 2.8 10*3/uL (ref 1.4–7.7)
Neutrophils Relative %: 50.5 % (ref 43.0–77.0)
Platelets: 342 10*3/uL (ref 150.0–400.0)
RBC: 4.53 Mil/uL (ref 3.87–5.11)
RDW: 14.6 % (ref 11.5–15.5)
WBC: 5.5 10*3/uL (ref 4.0–10.5)

## 2014-11-26 LAB — URINALYSIS, MICROSCOPIC ONLY

## 2014-11-26 LAB — T4, FREE: Free T4: 0.85 ng/dL (ref 0.60–1.60)

## 2014-11-26 LAB — COMPREHENSIVE METABOLIC PANEL WITH GFR
ALT: 12 U/L (ref 0–35)
AST: 12 U/L (ref 0–37)
Albumin: 4.3 g/dL (ref 3.5–5.2)
Alkaline Phosphatase: 67 U/L (ref 39–117)
BUN: 8 mg/dL (ref 6–23)
CO2: 29 meq/L (ref 19–32)
Calcium: 9.3 mg/dL (ref 8.4–10.5)
Chloride: 103 meq/L (ref 96–112)
Creatinine, Ser: 0.53 mg/dL (ref 0.40–1.20)
GFR: 138.32 mL/min
Glucose, Bld: 88 mg/dL (ref 70–99)
Potassium: 4.1 meq/L (ref 3.5–5.1)
Sodium: 138 meq/L (ref 135–145)
Total Bilirubin: 0.5 mg/dL (ref 0.2–1.2)
Total Protein: 6.8 g/dL (ref 6.0–8.3)

## 2014-11-26 LAB — HEMOGLOBIN A1C: Hgb A1c MFr Bld: 5.3 % (ref 4.6–6.5)

## 2014-11-26 LAB — VITAMIN B12: Vitamin B-12: 307 pg/mL (ref 211–911)

## 2014-11-26 LAB — TSH: TSH: 0.99 u[IU]/mL (ref 0.35–4.50)

## 2014-11-26 LAB — VITAMIN D 25 HYDROXY (VIT D DEFICIENCY, FRACTURES): VITD: 9.44 ng/mL — ABNORMAL LOW (ref 30.00–100.00)

## 2014-11-26 NOTE — Patient Instructions (Signed)
Our office will call you with lab results and any necessary follow up. Pleasure to meet you!  Weight loss goal is 40  pounds. This will take about 5 -8  months. If you want to count calories, limit to  1300-1800  calories daily if you are exercising 1 to 3 times week &   1500- 2000  calories if exercising 5 times weekly or more. Helpful phone app for calorie counting is "Go Meals". Consider reading Eat to Live by Excell Seltzer and begin implementing principles for best human nutrition & health.  Refer to hand out for principles & suggested menu. When you diverge from the guide, get back to healthy choices with the next meal or snack.  As discussed,cCut out refined sugar- anything that is sweet when you eat or drink it except fresh fruit.  Cut out refined grains- bread, rolls, biscuits, bagels, muffins, pasta and cereals that have less than 4 grams fiber per serving.  Limit animal fats & proteins to 3 to 4 times/week.  Develop lifelong habits of exercise most days of the week: take a 30 minute walk. The benefits include weight loss, lower risk for heart disease, diabetes, stroke, high blood pressure, lower rates of depression & dementia, better sleep quality & bone health.  Please return in 4 weeks to review diet & exercise changes.    Preventive Care for Adults, Female A healthy lifestyle and preventive care can promote health and wellness. Preventive health guidelines for women include the following key practices.  A routine yearly physical is a good way to check with your caregiver about your health and preventive screening. It is a chance to share any concerns and updates on your health, and to receive a thorough exam.  Visit your dentist for a routine exam and preventive care every 6 months. Brush your teeth twice a day and floss once a day. Good oral hygiene prevents tooth decay and gum disease.  The frequency of eye exams is based on your age, health, family medical history, use of  contact lenses, and other factors. Follow your caregiver's recommendations for frequency of eye exams.  Eat a healthy diet. Foods like vegetables, fruits, whole grains, low-fat dairy products, and lean protein foods contain the nutrients you need without too many calories. Decrease your intake of foods high in solid fats, added sugars, and salt. Eat the right amount of calories for you.Get information about a proper diet from your caregiver, if necessary.  Regular physical exercise is one of the most important things you can do for your health. Most adults should get at least 150 minutes of moderate-intensity exercise (any activity that increases your heart rate and causes you to sweat) each week. In addition, most adults need muscle-strengthening exercises on 2 or more days a week.  Maintain a healthy weight. The body mass index (BMI) is a screening tool to identify possible weight problems. It provides an estimate of body fat based on height and weight. Your caregiver can help determine your BMI, and can help you achieve or maintain a healthy weight.For adults 20 years and older:  A BMI below 18.5 is considered underweight.  A BMI of 18.5 to 24.9 is normal.  A BMI of 25 to 29.9 is considered overweight.  A BMI of 30 and above is considered obese.  Maintain normal blood lipids and cholesterol levels by exercising and minimizing your intake of saturated fat. Eat a balanced diet with plenty of fruit and vegetables. Blood tests for lipids and  cholesterol should begin at age 51 and be repeated every 5 years. If your lipid or cholesterol levels are high, you are over 50, or you are at high risk for heart disease, you may need your cholesterol levels checked more frequently.Ongoing high lipid and cholesterol levels should be treated with medicines if diet and exercise are not effective.  If you smoke, find out from your caregiver how to quit. If you do not use tobacco, do not start.  Lung cancer  screening is recommended for adults aged 29 80 years who are at high risk for developing lung cancer because of a history of smoking. Yearly low-dose computed tomography (CT) is recommended for people who have at least a 30-pack-year history of smoking and are a current smoker or have quit within the past 15 years. A pack year of smoking is smoking an average of 1 pack of cigarettes a day for 1 year (for example: 1 pack a day for 30 years or 2 packs a day for 15 years). Yearly screening should continue until the smoker has stopped smoking for at least 15 years. Yearly screening should also be stopped for people who develop a health problem that would prevent them from having lung cancer treatment.  If you are pregnant, do not drink alcohol. If you are breastfeeding, be very cautious about drinking alcohol. If you are not pregnant and choose to drink alcohol, do not exceed 1 drink per day. One drink is considered to be 12 ounces (355 mL) of beer, 5 ounces (148 mL) of wine, or 1.5 ounces (44 mL) of liquor.  Avoid use of street drugs. Do not share needles with anyone. Ask for help if you need support or instructions about stopping the use of drugs.  High blood pressure causes heart disease and increases the risk of stroke. Your blood pressure should be checked at least every 1 to 2 years. Ongoing high blood pressure should be treated with medicines if weight loss and exercise are not effective.  If you are 67 to 37 years old, ask your caregiver if you should take aspirin to prevent strokes.  Diabetes screening involves taking a blood sample to check your fasting blood sugar level. This should be done once every 3 years, after age 5, if you are within normal weight and without risk factors for diabetes. Testing should be considered at a younger age or be carried out more frequently if you are overweight and have at least 1 risk factor for diabetes.  Breast cancer screening is essential preventive care for  women. You should practice "breast self-awareness." This means understanding the normal appearance and feel of your breasts and may include breast self-examination. Any changes detected, no matter how small, should be reported to a caregiver. Women in their 20s and 30s should have a clinical breast exam (CBE) by a caregiver as part of a regular health exam every 1 to 3 years. After age 72, women should have a CBE every year. Starting at age 64, women should consider having a mammography (breast X-ray test) every year. Women who have a family history of breast cancer should talk to their caregiver about genetic screening. Women at a high risk of breast cancer should talk to their caregivers about having magnetic resonance imaging (MRI) and a mammography every year.  Breast cancer gene (BRCA)-related cancer risk assessment is recommended for women who have family members with BRCA-related cancers. BRCA-related cancers include breast, ovarian, tubal, and peritoneal cancers. Having family members with these  cancers may be associated with an increased risk for harmful changes (mutations) in the breast cancer genes BRCA1 and BRCA2. Results of the assessment will determine the need for genetic counseling and BRCA1 and BRCA2 testing.  The Pap test is a screening test for cervical cancer. A Pap test can show cell changes on the cervix that might become cervical cancer if left untreated. A Pap test is a procedure in which cells are obtained and examined from the lower end of the uterus (cervix).  Women should have a Pap test starting at age 81.  Between ages 67 and 31, Pap tests should be repeated every 2 years.  Beginning at age 66, you should have a Pap test every 3 years as long as the past 3 Pap tests have been normal.  Some women have medical problems that increase the chance of getting cervical cancer. Talk to your caregiver about these problems. It is especially important to talk to your caregiver if a new  problem develops soon after your last Pap test. In these cases, your caregiver may recommend more frequent screening and Pap tests.  The above recommendations are the same for women who have or have not gotten the vaccine for human papillomavirus (HPV).  If you had a hysterectomy for a problem that was not cancer or a condition that could lead to cancer, then you no longer need Pap tests. Even if you no longer need a Pap test, a regular exam is a good idea to make sure no other problems are starting.  If you are between ages 27 and 47, and you have had normal Pap tests going back 10 years, you no longer need Pap tests. Even if you no longer need a Pap test, a regular exam is a good idea to make sure no other problems are starting.  If you have had past treatment for cervical cancer or a condition that could lead to cancer, you need Pap tests and screening for cancer for at least 20 years after your treatment.  If Pap tests have been discontinued, risk factors (such as a new sexual partner) need to be reassessed to determine if screening should be resumed.  The HPV test is an additional test that may be used for cervical cancer screening. The HPV test looks for the virus that can cause the cell changes on the cervix. The cells collected during the Pap test can be tested for HPV. The HPV test could be used to screen women aged 54 years and older, and should be used in women of any age who have unclear Pap test results. After the age of 75, women should have HPV testing at the same frequency as a Pap test.  Colorectal cancer can be detected and often prevented. Most routine colorectal cancer screening begins at the age of 56 and continues through age 69. However, your caregiver may recommend screening at an earlier age if you have risk factors for colon cancer. On a yearly basis, your caregiver may provide home test kits to check for hidden blood in the stool. Use of a small camera at the end of a tube,  to directly examine the colon (sigmoidoscopy or colonoscopy), can detect the earliest forms of colorectal cancer. Talk to your caregiver about this at age 3, when routine screening begins. Direct examination of the colon should be repeated every 5 to 10 years through age 80, unless early forms of pre-cancerous polyps or small growths are found.  Hepatitis C blood testing  is recommended for all people born from [birth year] through 1965 and any individual with known risks for hepatitis C.  Practice safe sex. Use condoms and avoid high-risk sexual practices to reduce the spread of sexually transmitted infections (STIs). STIs include gonorrhea, chlamydia, syphilis, trichomonas, herpes, HPV, and human immunodeficiency virus (HIV). Herpes, HIV, and HPV are viral illnesses that have no cure. They can result in disability, cancer, and death. Sexually active women aged 55 and younger should be checked for chlamydia. Older women with new or multiple partners should also be tested for chlamydia. Testing for other STIs is recommended if you are sexually active and at increased risk.  Osteoporosis is a disease in which the bones lose minerals and strength with aging. This can result in serious bone fractures. The risk of osteoporosis can be identified using a bone density scan. Women ages 72 and over and women at risk for fractures or osteoporosis should discuss screening with their caregivers. Ask your caregiver whether you should take a calcium supplement or vitamin D to reduce the rate of osteoporosis.  Menopause can be associated with physical symptoms and risks. Hormone replacement therapy is available to decrease symptoms and risks. You should talk to your caregiver about whether hormone replacement therapy is right for you.  Use sunscreen. Apply sunscreen liberally and repeatedly throughout the day. You should seek shade when your shadow is shorter than you. Protect yourself by wearing long sleeves, pants, a  wide-brimmed hat, and sunglasses year round, whenever you are outdoors.  Once a month, do a whole body skin exam, using a mirror to look at the skin on your back. Notify your caregiver of new moles, moles that have irregular borders, moles that are larger than a pencil eraser, or moles that have changed in shape or color.  Stay current with required immunizations.  Influenza vaccine. All adults should be immunized every year.  Tetanus, diphtheria, and acellular pertussis (Td, Tdap) vaccine. Pregnant women should receive 1 dose of Tdap vaccine during each pregnancy. The dose should be obtained regardless of the length of time since the last dose. Immunization is preferred during the 27th to 36th week of gestation. An adult who has not previously received Tdap or who does not know her vaccine status should receive 1 dose of Tdap. This initial dose should be followed by tetanus and diphtheria toxoids (Td) booster doses every 10 years. Adults with an unknown or incomplete history of completing a 3-dose immunization series with Td-containing vaccines should begin or complete a primary immunization series including a Tdap dose. Adults should receive a Td booster every 10 years.  Varicella vaccine. An adult without evidence of immunity to varicella should receive 2 doses or a second dose if she has previously received 1 dose. Pregnant females who do not have evidence of immunity should receive the first dose after pregnancy. This first dose should be obtained before leaving the health care facility. The second dose should be obtained 4 8 weeks after the first dose.  Human papillomavirus (HPV) vaccine. Females aged 67 26 years who have not received the vaccine previously should obtain the 3-dose series. The vaccine is not recommended for use in pregnant females. However, pregnancy testing is not needed before receiving a dose. If a female is found to be pregnant after receiving a dose, no treatment is needed. In  that case, the remaining doses should be delayed until after the pregnancy. Immunization is recommended for any person with an immunocompromised condition through the age of  26 years if she did not get any or all doses earlier. During the 3-dose series, the second dose should be obtained 4 8 weeks after the first dose. The third dose should be obtained 24 weeks after the first dose and 16 weeks after the second dose.  Zoster vaccine. One dose is recommended for adults aged 71 years or older unless certain conditions are present.  Measles, mumps, and rubella (MMR) vaccine. Adults born before [birth year] generally are considered immune to measles and mumps. Adults born in [birth year] or later should have 1 or more doses of MMR vaccine unless there is a contraindication to the vaccine or there is laboratory evidence of immunity to each of the three diseases. A routine second dose of MMR vaccine should be obtained at least 28 days after the first dose for students attending postsecondary schools, health care workers, or international travelers. People who received inactivated measles vaccine or an unknown type of measles vaccine during 1963 1967 should receive 2 doses of MMR vaccine. People who received inactivated mumps vaccine or an unknown type of mumps vaccine before 1979 and are at high risk for mumps infection should consider immunization with 2 doses of MMR vaccine. For females of childbearing age, rubella immunity should be determined. If there is no evidence of immunity, females who are not pregnant should be vaccinated. If there is no evidence of immunity, females who are pregnant should delay immunization until after pregnancy. Unvaccinated health care workers born before [birth year] who lack laboratory evidence of measles, mumps, or rubella immunity or laboratory confirmation of disease should consider measles and mumps immunization with 2 doses of MMR vaccine or rubella immunization with 1 dose of MMR  vaccine.  Pneumococcal 13-valent conjugate (PCV13) vaccine. When indicated, a person who is uncertain of her immunization history and has no record of immunization should receive the PCV13 vaccine. An adult aged 31 years or older who has certain medical conditions and has not been previously immunized should receive 1 dose of PCV13 vaccine. This PCV13 should be followed with a dose of pneumococcal polysaccharide (PPSV23) vaccine. The PPSV23 vaccine dose should be obtained at least 8 weeks after the dose of PCV13 vaccine. An adult aged 72 years or older who has certain medical conditions and previously received 1 or more doses of PPSV23 vaccine should receive 1 dose of PCV13. The PCV13 vaccine dose should be obtained 1 or more years after the last PPSV23 vaccine dose.  Pneumococcal polysaccharide (PPSV23) vaccine. When PCV13 is also indicated, PCV13 should be obtained first. All adults aged 36 years and older should be immunized. An adult younger than age 77 years who has certain medical conditions should be immunized. Any person who resides in a nursing home or long-term care facility should be immunized. An adult smoker should be immunized. People with an immunocompromised condition and certain other conditions should receive both PCV13 and PPSV23 vaccines. People with human immunodeficiency virus (HIV) infection should be immunized as soon as possible after diagnosis. Immunization during chemotherapy or radiation therapy should be avoided. Routine use of PPSV23 vaccine is not recommended for American Indians, [place] Natives, or people younger than 65 years unless there are medical conditions that require PPSV23 vaccine. When indicated, people who have unknown immunization and have no record of immunization should receive PPSV23 vaccine. One-time revaccination 5 years after the first dose of PPSV23 is recommended for people aged 52 64 years who have chronic kidney failure, nephrotic syndrome, asplenia, or  immunocompromised conditions. People who received  1 2 doses of PPSV23 before age 35 years should receive another dose of PPSV23 vaccine at age 58 years or later if at least 5 years have passed since the previous dose. Doses of PPSV23 are not needed for people immunized with PPSV23 at or after age 83 years.  Meningococcal vaccine. Adults with asplenia or persistent complement component deficiencies should receive 2 doses of quadrivalent meningococcal conjugate (MenACWY-D) vaccine. The doses should be obtained at least 2 months apart. Microbiologists working with certain meningococcal bacteria, [employer] recruits, people at risk during an outbreak, and people who travel to or live in countries with a high rate of meningitis should be immunized. A first-year college student up through age 103 years who is living in a residence hall should receive a dose if she did not receive a dose on or after her 16th birthday. Adults who have certain high-risk conditions should receive one or more doses of vaccine.  Hepatitis A vaccine. Adults who wish to be protected from this disease, have certain high-risk conditions, work with hepatitis A-infected animals, work in hepatitis A research labs, or travel to or work in countries with a high rate of hepatitis A should be immunized. Adults who were previously unvaccinated and who anticipate close contact with an international adoptee during the first 60 days after arrival in the [place] from a country with a high rate of hepatitis A should be immunized.  Hepatitis B vaccine. Adults who wish to be protected from this disease, have certain high-risk conditions, may be exposed to blood or other infectious body fluids, are household contacts or sex partners of hepatitis B positive people, are clients or workers in certain care facilities, or travel to or work in countries with a high rate of hepatitis B should be immunized.  Haemophilus influenzae type b (Hib) vaccine. A  previously unvaccinated person with asplenia or sickle cell disease or having a scheduled splenectomy should receive 1 dose of Hib vaccine. Regardless of previous immunization, a recipient of a hematopoietic stem cell transplant should receive a 3-dose series 6 12 months after her successful transplant. Hib vaccine is not recommended for adults with HIV infection. Preventive Services / Frequency Ages 45 to 93  Blood pressure check.** / Every 1 to 2 years.  Lipid and cholesterol check.** / Every 5 years beginning at age 41.  Clinical breast exam.** / Every 3 years for women in their 69s and 73s.  BRCA-related cancer risk assessment.** / For women who have family members with a BRCA-related cancer (breast, ovarian, tubal, or peritoneal cancers).  Pap test.** / Every 2 years from ages 3 through 63. Every 3 years starting at age 74 through age 16 or 22 with a history of 3 consecutive normal Pap tests.  HPV screening.** / Every 3 years from ages 43 through ages 78 to 53 with a history of 3 consecutive normal Pap tests.  Hepatitis C blood test.** / For any individual with known risks for hepatitis C.  Skin self-exam. / Monthly.  Influenza vaccine. / Every year.  Tetanus, diphtheria, and acellular pertussis (Tdap, Td) vaccine.** / Consult your caregiver. Pregnant women should receive 1 dose of Tdap vaccine during each pregnancy. 1 dose of Td every 10 years.  Varicella vaccine.** / Consult your caregiver. Pregnant females who do not have evidence of immunity should receive the first dose after pregnancy.  HPV vaccine. / 3 doses over 6 months, if 57 and younger. The vaccine is not recommended for use in pregnant females. However,  pregnancy testing is not needed before receiving a dose.  Measles, mumps, rubella (MMR) vaccine.** / You need at least 1 dose of MMR if you were born in 1957 or later. You may also need a 2nd dose. For females of childbearing age, rubella immunity should be determined.  If there is no evidence of immunity, females who are not pregnant should be vaccinated. If there is no evidence of immunity, females who are pregnant should delay immunization until after pregnancy.  Pneumococcal 13-valent conjugate (PCV13) vaccine.** / Consult your caregiver.  Pneumococcal polysaccharide (PPSV23) vaccine.** / 1 to 2 doses if you smoke cigarettes or if you have certain conditions.  Meningococcal vaccine.** / 1 dose if you are age 62 to 74 years and a Market researcher living in a residence hall, or have one of several medical conditions, you need to get vaccinated against meningococcal disease. You may also need additional booster doses.  Hepatitis A vaccine.** / Consult your caregiver.  Hepatitis B vaccine.** / Consult your caregiver.  Haemophilus influenzae type b (Hib) vaccine.** / Consult your caregiver. Ages 86 to 50  Blood pressure check.** / Every 1 to 2 years.  Lipid and cholesterol check.** / Every 5 years beginning at age 52.  Lung cancer screening. / Every year if you are aged 72 80 years and have a 30-pack-year history of smoking and currently smoke or have quit within the past 15 years. Yearly screening is stopped once you have quit smoking for at least 15 years or develop a health problem that would prevent you from having lung cancer treatment.  Clinical breast exam.** / Every year after age 38.  BRCA-related cancer risk assessment.** / For women who have family members with a BRCA-related cancer (breast, ovarian, tubal, or peritoneal cancers).  Mammogram.** / Every year beginning at age 63 and continuing for as long as you are in good health. Consult with your caregiver.  Pap test.** / Every 3 years starting at age 42 through age 62 or 93 with a history of 3 consecutive normal Pap tests.  HPV screening.** / Every 3 years from ages 40 through ages 36 to 42 with a history of 3 consecutive normal Pap tests.  Fecal occult blood test (FOBT) of  stool. / Every year beginning at age 49 and continuing until age 42. You may not need to do this test if you get a colonoscopy every 10 years.  Flexible sigmoidoscopy or colonoscopy.** / Every 5 years for a flexible sigmoidoscopy or every 10 years for a colonoscopy beginning at age 65 and continuing until age 28.  Hepatitis C blood test.** / For all people born from [birth year] through 1963 and any individual with known risks for hepatitis C.  Skin self-exam. / Monthly.  Influenza vaccine. / Every year.  Tetanus, diphtheria, and acellular pertussis (Tdap/Td) vaccine.** / Consult your caregiver. Pregnant women should receive 1 dose of Tdap vaccine during each pregnancy. 1 dose of Td every 10 years.  Varicella vaccine.** / Consult your caregiver. Pregnant females who do not have evidence of immunity should receive the first dose after pregnancy.  Zoster vaccine.** / 1 dose for adults aged 43 years or older.  Measles, mumps, rubella (MMR) vaccine.** / You need at least 1 dose of MMR if you were born in 1957 or later. You may also need a 2nd dose. For females of childbearing age, rubella immunity should be determined. If there is no evidence of immunity, females who are not pregnant should be vaccinated. If  there is no evidence of immunity, females who are pregnant should delay immunization until after pregnancy.  Pneumococcal 13-valent conjugate (PCV13) vaccine.** / Consult your caregiver.  Pneumococcal polysaccharide (PPSV23) vaccine.** / 1 to 2 doses if you smoke cigarettes or if you have certain conditions.  Meningococcal vaccine.** / Consult your caregiver.  Hepatitis A vaccine.** / Consult your caregiver.  Hepatitis B vaccine.** / Consult your caregiver.  Haemophilus influenzae type b (Hib) vaccine.** / Consult your caregiver. Ages 27 and over  Blood pressure check.** / Every 1 to 2 years.  Lipid and cholesterol check.** / Every 5 years beginning at age 65.  Lung cancer screening. /  Every year if you are aged 65 80 years and have a 30-pack-year history of smoking and currently smoke or have quit within the past 15 years. Yearly screening is stopped once you have quit smoking for at least 15 years or develop a health problem that would prevent you from having lung cancer treatment.  Clinical breast exam.** / Every year after age 62.  BRCA-related cancer risk assessment.** / For women who have family members with a BRCA-related cancer (breast, ovarian, tubal, or peritoneal cancers).  Mammogram.** / Every year beginning at age 61 and continuing for as long as you are in good health. Consult with your caregiver.  Pap test.** / Every 3 years starting at age 72 through age 51 or 34 with a 3 consecutive normal Pap tests. Testing can be stopped between 65 and 70 with 3 consecutive normal Pap tests and no abnormal Pap or HPV tests in the past 10 years.  HPV screening.** / Every 3 years from ages 33 through ages 36 or 79 with a history of 3 consecutive normal Pap tests. Testing can be stopped between 65 and 70 with 3 consecutive normal Pap tests and no abnormal Pap or HPV tests in the past 10 years.  Fecal occult blood test (FOBT) of stool. / Every year beginning at age 30 and continuing until age 65. You may not need to do this test if you get a colonoscopy every 10 years.  Flexible sigmoidoscopy or colonoscopy.** / Every 5 years for a flexible sigmoidoscopy or every 10 years for a colonoscopy beginning at age 55 and continuing until age 64.  Hepatitis C blood test.** / For all people born from 35 through 1965 and any individual with known risks for hepatitis C.  Osteoporosis screening.** / A one-time screening for women ages 62 and over and women at risk for fractures or osteoporosis.  Skin self-exam. / Monthly.  Influenza vaccine. / Every year.  Tetanus, diphtheria, and acellular pertussis (Tdap/Td) vaccine.** / 1 dose of Td every 10 years.  Varicella vaccine.** / Consult  your caregiver.  Zoster vaccine.** / 1 dose for adults aged 27 years or older.  Pneumococcal 13-valent conjugate (PCV13) vaccine.** / Consult your caregiver.  Pneumococcal polysaccharide (PPSV23) vaccine.** / 1 dose for all adults aged 26 years and older.  Meningococcal vaccine.** / Consult your caregiver.  Hepatitis A vaccine.** / Consult your caregiver.  Hepatitis B vaccine.** / Consult your caregiver.  Haemophilus influenzae type b (Hib) vaccine.** / Consult your caregiver. ** Family history and personal history of risk and conditions may change your caregiver's recommendations. Document Released: 09/11/2001 Document Revised: 11/10/2012 Document Reviewed: 12/11/2010 [facility] Patient Information 2014 [place], [place].

## 2014-11-26 NOTE — Progress Notes (Signed)
Pre visit review using our clinic review tool, if applicable. No additional management support is needed unless otherwise documented below in the visit note. 

## 2014-11-29 ENCOUNTER — Encounter: Payer: Self-pay | Admitting: Nurse Practitioner

## 2014-11-29 ENCOUNTER — Other Ambulatory Visit: Payer: Self-pay | Admitting: Nurse Practitioner

## 2014-11-29 DIAGNOSIS — E559 Vitamin D deficiency, unspecified: Secondary | ICD-10-CM

## 2014-11-29 DIAGNOSIS — Z0000 Encounter for general adult medical examination without abnormal findings: Secondary | ICD-10-CM | POA: Insufficient documentation

## 2014-11-29 DIAGNOSIS — E669 Obesity, unspecified: Secondary | ICD-10-CM | POA: Insufficient documentation

## 2014-11-29 MED ORDER — VITAMIN D3 1.25 MG (50000 UT) PO CAPS: 1.0000 | ORAL_CAPSULE | ORAL | Status: DC

## 2014-11-29 NOTE — Progress Notes (Signed)
Subjective:     Teresa Wong is a 39 y.o. female and is here for a comprehensive physical exam. The patient reports problems - desires wt loss management. She has 2 page form to be filled out for [employer]. Teresa Wong has not had PCP in past, but sees Dr Philis Pique for [facility]. She has Hx of 5 miscarriages and was found to have spontaneous genetic defect : MTHFR that causes blood clots b/w placenta & uterus. She had a baby last year & had tubal ligation. She is currently treated for post-partum depression w/ 20 mg fluoxetine qd w/ satisfactory results.  BMI is 31.47. She desires wt loss. She is not exercising. I discussed benefits of nml BMI: decreased risk for chronic disease. Deteailed discussion regarding wt losss goal, calorie & exercise goals,  diet changes.  History   Social History  . Marital Status: Married    Spouse Name: N/A  . Number of Children: 2  . Years of Education: some colle   Occupational History  . Not on file.   Social History Main Topics  . Smoking status: Never Smoker   . Smokeless tobacco: Never Used  . Alcohol Use: No  . Drug Use: No  . Sexual Activity: Yes    Birth Control/ Protection: Surgical     Comment: pregnant    Other Topics Concern  . Not on file   Social History Narrative   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   Health Maintenance  Topic Date Due  . INFLUENZA VACCINE  02/28/2015  . PAP SMEAR  03/17/2016  . TETANUS/TDAP  11/27/2023  . HIV Screening  Completed    The following portions of the patient's history were reviewed and updated as appropriate: allergies, current medications, past family history, past medical history, past social history, past surgical history and problem list.  Review of Systems Constitutional: negative for fatigue and fevers Eyes: negative for contacts/glasses and visual disturbance Ears, nose, mouth, throat, and face: negative for nasal congestion Respiratory: negative for  cough Cardiovascular: negative for exertional chest pressure/discomfort, lower extremity edema and palpitations Gastrointestinal: negative for constipation, diarrhea and reflux symptoms Genitourinary:negative for abnormal menstrual periods Integument/breast: negative for rash Behavioral/Psych: negative for sleep disturbance   Objective:    BP 119/82 mmHg  Pulse 70  Temp(Src) 98 F (36.7 C) (Oral)  Ht 5' 7"  (1.702 m)  Wt 201 lb (91.173 kg)  BMI 31.47 kg/m2  SpO2 100%  LMP 10/31/2014 General appearance: alert, cooperative, appears stated age and no distress Head: Normocephalic, without obvious abnormality, atraumatic Eyes: negative findings: lids and lashes normal, conjunctivae and sclerae normal, corneas clear and pupils equal, round, reactive to light and accomodation Ears: normal TM's and external ear canals both ears Throat: lips, mucosa, and tongue normal; teeth and gums normal Neck: no adenopathy, no carotid bruit, supple, symmetrical, trachea midline and thyroid not enlarged, symmetric, no tenderness/mass/nodules Lungs: clear to auscultation bilaterally Heart: regular rate and rhythm, S1, S2 normal, no murmur, click, rub or gallop Abdomen: soft, non-tender; bowel sounds normal; no masses,  no organomegaly Extremities: extremities normal, atraumatic, no cyanosis or edema Skin: Skin color, texture, turgor normal. No rashes or lesions Neurologic: Grossly normal    Assessment:Plan     1. Preventative health care - CBC with Differential/Platelet - Comprehensive metabolic panel - Lipid panel - Hemoglobin A1c - T4, free - TSH - Vit D  25 hydroxy (rtn osteoporosis monitoring) - Vitamin B12 - Urine  Microscopic  2. BMI 31.0-31.9,adult See instructions for specific wt loss & calorie goal HO given for diet changes   F/u 4 weeks

## 2014-11-29 NOTE — Telephone Encounter (Signed)
LMOVM for patient to return call concerning lab results.  

## 2014-11-29 NOTE — Telephone Encounter (Signed)
pls call pt: Advise A few concerns w/labs: vit D too Low. low D can contribute to fatigue, depression, and compromise bone health. Start prescription D. Take weekly for 12 weeks.  B12 is borderline. Start B complex take daily to get 1200 mcg B12. She had some bacteria in urine. Any symptoms of UTI?. I will repeat when she comes back at end of may.  Pls ask if still lactating. If so, I will have her take OTC D3.

## 2014-11-29 NOTE — Telephone Encounter (Signed)
Patient called back and I informed her of results.   - she is NOT having any symptoms of UTI, told her to call if she develops symptoms.  -She is NOT lactating anymore

## 2014-12-24 ENCOUNTER — Ambulatory Visit: Payer: 59 | Admitting: Nurse Practitioner

## 2015-01-12 ENCOUNTER — Encounter: Payer: Self-pay | Admitting: Nurse Practitioner

## 2015-01-12 ENCOUNTER — Ambulatory Visit (INDEPENDENT_AMBULATORY_CARE_PROVIDER_SITE_OTHER): Payer: 59 | Admitting: Nurse Practitioner

## 2015-01-12 VITALS — BP 135/90 | HR 73 | Temp 98.0°F | Resp 18 | Ht 67.0 in | Wt 203.0 lb

## 2015-01-12 DIAGNOSIS — E559 Vitamin D deficiency, unspecified: Secondary | ICD-10-CM | POA: Insufficient documentation

## 2015-01-12 DIAGNOSIS — E7800 Pure hypercholesterolemia, unspecified: Secondary | ICD-10-CM

## 2015-01-12 DIAGNOSIS — E538 Deficiency of other specified B group vitamins: Secondary | ICD-10-CM | POA: Insufficient documentation

## 2015-01-12 DIAGNOSIS — Z6831 Body mass index (BMI) 31.0-31.9, adult: Secondary | ICD-10-CM | POA: Diagnosis not present

## 2015-01-12 DIAGNOSIS — E785 Hyperlipidemia, unspecified: Secondary | ICD-10-CM | POA: Insufficient documentation

## 2015-01-12 NOTE — Progress Notes (Signed)
Subjective:     Teresa Wong is a 39 y.o. female returns to discuss recent labs & desire to have healthier weight. Labs: vitamin D deficiency, elevated LDL, B12 deficiency. She has started prescription D. No intol SE. Discussed indications. Will check level in 2 mos when completes 12 wks script. Taking B12 daily. Harvest [place] is over 400. Will check level in 2 mos. Discussed diet changes that will lower LDL. Wt loss: she has gained 2 lbs since last OV. She is not exercising as time does not afford: working FT, school FT this summer, has small children.  SHe started reading "Eat to LIve" & is making some changes. She desires healthier weight, is motivated. SHe will ret in 2 mos.  The following portions of the patient's history were reviewed and updated as appropriate: allergies, current medications, past medical history, past social history, past surgical history and problem list.  Review of Systems Behavioral/Psych: currently treated for PPD by gynecology. Reports improvement with mood. Today scored 3 on Edinburgh screening assessment.    Objective:    BP 135/90 mmHg  Pulse 73  Temp(Src) 98 F (36.7 C) (Temporal)  Resp 18  Ht 5' 7"  (1.702 m)  Wt 203 lb (92.08 kg)  BMI 31.79 kg/m2  SpO2 99%  Breastfeeding? No BP 135/90 mmHg  Pulse 73  Temp(Src) 98 F (36.7 C) (Temporal)  Resp 18  Ht 5' 7"  (1.702 m)  Wt 203 lb (92.08 kg)  BMI 31.79 kg/m2  SpO2 99%  Breastfeeding? No General appearance: alert, cooperative, appears stated age and no distress Eyes: negative findings: lids and lashes normal and conjunctivae and sclerae normal Neurologic: Grossly normal    Assessment:Plan   1. Vitamin D deficiency new Continue prescription D3 Check level in 2  mos  2. Elevated LDL cholesterol level new Diet changes: decrease meat, cheese, creamed & fried foods  3. BMI 31.0-31.9,adult Increase activity as able Cut out refined sugars & flours  4. Vitamin B12 deficiency (non  anemic) new Continue B12 or B complex to get 1200 mcg or 1 mg B12 daily Check level in 2 mos  F/u 2 mos-vit D, B12, wt mgmnt

## 2015-01-12 NOTE — Progress Notes (Signed)
Pre visit review using our clinic review tool, if applicable. No additional management support is needed unless otherwise documented below in the visit note. 

## 2015-01-12 NOTE — Patient Instructions (Signed)
Continue weekly vitamin D.  Continue B12 or B complex to get 1200 mcg or 1 mg B12 daily.  Continue to cut out refined sugar: anything that is sweet when you eat or drink it, except fresh fruit. Cut out refined grains: white bread, rolls, biscuits, bagels, muffins, pasta and cereals. Choose grains with 4 gm or more of fiber per serving.  Limit meat, cheese, creamed & fried foods.  Press on with your studies! Good luck & I will see you in 2 months.

## 2015-03-04 ENCOUNTER — Encounter: Payer: Self-pay | Admitting: Family Medicine

## 2015-03-04 ENCOUNTER — Ambulatory Visit (INDEPENDENT_AMBULATORY_CARE_PROVIDER_SITE_OTHER): Payer: 59 | Admitting: Family Medicine

## 2015-03-04 VITALS — BP 128/87 | HR 92 | Temp 98.0°F | Resp 18 | Ht 67.0 in | Wt 206.0 lb

## 2015-03-04 DIAGNOSIS — Z111 Encounter for screening for respiratory tuberculosis: Secondary | ICD-10-CM | POA: Diagnosis not present

## 2015-03-04 DIAGNOSIS — H905 Unspecified sensorineural hearing loss: Secondary | ICD-10-CM

## 2015-03-04 DIAGNOSIS — Z0110 Encounter for examination of ears and hearing without abnormal findings: Secondary | ICD-10-CM | POA: Diagnosis not present

## 2015-03-04 NOTE — Patient Instructions (Signed)
It was a pleasure meeting you today, I look forward to seeing you again in September. At that time we will discuss your weight management, and repeat your labs to check your vitamin D and B 12 levels.

## 2015-03-04 NOTE — Progress Notes (Signed)
Pre visit review using our clinic review tool, if applicable. No additional management support is needed unless otherwise documented below in the visit note. 

## 2015-03-04 NOTE — Progress Notes (Signed)
   Subjective:    Patient ID: Teresa Wong, female    DOB: February 10, 1976, 39 y.o.   MRN: [id number]  HPI  Hearing screen:  Patient presents to office visit for hearing screen for employment. She is a Occupational psychologist at [employer]. She has congenital deafness the right ear. She denies seeing hearing loss, ringing of ears or ear pain.  - she brings in her updated immunization record today for our records.   never smoker Past Medical History  Diagnosis Date  . Abnormal Pap smear   . History of LEEP (loop electrosurgical excision procedure) of cervix complicating pregnancy 2423  . Meningitis 1984  . Pregnancy induced hypertension 2010, 2012    History - resolved - no problems with current pregnancy 2015  . 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 Postpartum depression     Review of Systems Negative, with the exception of above mentioned in HPI     Objective:   Physical Exam BP 128/87 mmHg  Pulse 92  Temp(Src) 98 F (36.7 C) (Temporal)  Resp 18  Ht 5' 7"  (1.702 m)  Wt 206 lb (93.441 kg)  BMI 32.26 kg/m2  SpO2 97%  Breastfeeding? No Gen: NAD. Nontoxic in appearance, well-developed, well-nourished, Caucasian female. Mildly obese. Pleasant. Ear: Past hearing screen left. Congenital deafness right.     Assessment & Plan:  Teresa Wong is a 39 y.o. female present for hearing screening. - Congenital deaf right ear, passed hearing left ear. Paperwork was filled out, and return to patient. Pacer made and placed in chart. - Copies and chart was updated with new Immunizations. - Patient has an appointment for September to discuss her vitamin D deficiency, B-12 and weight loss.

## 2015-03-07 ENCOUNTER — Ambulatory Visit (INDEPENDENT_AMBULATORY_CARE_PROVIDER_SITE_OTHER): Payer: 59 | Admitting: Family Medicine

## 2015-03-07 DIAGNOSIS — Z111 Encounter for screening for respiratory tuberculosis: Secondary | ICD-10-CM | POA: Diagnosis not present

## 2015-03-09 ENCOUNTER — Encounter: Payer: Self-pay | Admitting: *Deleted

## 2015-03-09 ENCOUNTER — Encounter: Payer: Self-pay | Admitting: Family Medicine

## 2015-03-09 ENCOUNTER — Ambulatory Visit: Payer: 59 | Admitting: *Deleted

## 2015-03-09 LAB — TB SKIN TEST
INDURATION: 0 mm
TB Skin Test: NEGATIVE

## 2015-04-01 ENCOUNTER — Ambulatory Visit: Payer: 59 | Admitting: Family Medicine

## 2015-04-05 ENCOUNTER — Ambulatory Visit: Payer: 59 | Admitting: Family Medicine

## 2015-05-12 ENCOUNTER — Emergency Department
Admission: EM | Admit: 2015-05-12 | Discharge: 2015-05-12 | Disposition: A | Discharge status: Home / Self Care | Payer: 59 | Source: Home / Self Care | Attending: Family Medicine | Admitting: Family Medicine

## 2015-05-12 ENCOUNTER — Encounter: Payer: Self-pay | Admitting: *Deleted

## 2015-05-12 ENCOUNTER — Emergency Department (INDEPENDENT_AMBULATORY_CARE_PROVIDER_SITE_OTHER): Payer: 59

## 2015-05-12 DIAGNOSIS — R05 Cough: Secondary | ICD-10-CM | POA: Diagnosis not present

## 2015-05-12 DIAGNOSIS — R059 Cough, unspecified: Secondary | ICD-10-CM

## 2015-05-12 DIAGNOSIS — J069 Acute upper respiratory infection, unspecified: Secondary | ICD-10-CM | POA: Diagnosis not present

## 2015-05-12 MED ORDER — DM-GUAIFENESIN ER 30-600 MG PO TB12: 1.0000 | ORAL_TABLET | Freq: Two times a day (BID) | ORAL | Status: DC

## 2015-05-12 MED ORDER — HYDROCODONE-HOMATROPINE 5-1.5 MG/5ML PO SYRP: 5.0000 mL | ORAL_SOLUTION | Freq: Four times a day (QID) | ORAL | Status: DC | PRN

## 2015-05-12 MED ORDER — BENZONATATE 100 MG PO CAPS: 100.0000 mg | ORAL_CAPSULE | Freq: Three times a day (TID) | ORAL | Status: DC

## 2015-05-12 NOTE — ED Notes (Signed)
Pt c/o 3 days cough and ocngestion, today much worse with HA. Afebrile.

## 2015-05-12 NOTE — ED Provider Notes (Signed)
CSN: [id number]     Arrival date & time 05/12/15  1551 History   First MD Initiated Contact with Patient 05/12/15 1609     Chief Complaint  Patient presents with  . Cough  . Headache   (Consider location/radiation/quality/duration/timing/severity/associated sxs/prior Treatment) HPI Pt is a 39yo female presenting [facility] with c/o gradually worsening mild to moderately intermittent cough with congestion. Pt reports mildly productive cough with clear sputum.  Symptoms started 3 days ago.  Cough has caused her to develop a generalized headache.  She has been taking ibuprofen for her headache but nothing for the cough. Minimal relief with ibuprofen. Pt's younger daughter was sick but she is better now. No recent travel. Pt denies hx of asthma. Denies CP or SOB at this time.  Past Medical History  Diagnosis Date  . Abnormal Pap smear   . History of LEEP (loop electrosurgical excision procedure) of cervix complicating pregnancy 0131  . Meningitis 1984  . Pregnancy induced hypertension 2010, 2012    History - resolved - no problems with current pregnancy 2015  . 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 Postpartum depression    Past Surgical History  Procedure Laterality Date  . Dilation and evacuation  12/18/2011    Procedure: DILATATION AND EVACUATION;  Surgeon: Daria Pastures, MD;  Location: [facility];  Service: Gynecology;  Laterality: N/A;  . Dilation and curettage of uterus  11/2011,08/2010,12/2007,08/2007    x4  . Colposcopy  09/2004  . Cervical biopsy  w/ loop electrode excision  2006  . Wisdom tooth extraction    . Cesarean section N/A 11/26/2013    Procedure: CESAREAN SECTION;  Surgeon: Daria Pastures, MD;  Location: [facility];  Service: Obstetrics;  Laterality: N/A;  . Tubal ligation Bilateral 11/26/2013    Procedure: BILATERAL TUBAL LIGATION;  Surgeon: Daria Pastures, MD;  Location: [facility];  Service: Obstetrics;  Laterality: Bilateral;   Family  History  Problem Relation Age of Onset  . Hypertension Mother   . Hyperlipidemia Mother   . CAD Father 3    Bypass surgery 1.5 yrs ago   . Hyperlipidemia Father   . Cancer Father     bladder  . Heart disease Maternal Grandfather   . Colon cancer Maternal Grandmother   . Cancer Maternal Grandmother     colon  . Miscarriages / Stillbirths Paternal Grandmother    Social History  Substance Use Topics  . Smoking status: Never Smoker   . Smokeless tobacco: Never Used  . Alcohol Use: No   OB History    Gravida Para Term Preterm AB TAB SAB Ectopic Multiple Living   7 3 2 1 4  0 4 0 0 2     Review of Systems  Constitutional: Positive for chills. Negative for fever.  HENT: Positive for rhinorrhea and sneezing. Negative for congestion, ear pain, sore throat, trouble swallowing and voice change.   Respiratory: Positive for cough. Negative for shortness of breath.   Cardiovascular: Negative for chest pain and palpitations.  Gastrointestinal: Negative for nausea, vomiting, abdominal pain and diarrhea.  Musculoskeletal: Positive for myalgias and arthralgias. Negative for back pain.  Skin: Negative for rash.  Neurological: Positive for headaches. Negative for dizziness and light-headedness.  All other systems reviewed and are negative.   Allergies  Review of patient's allergies indicates no known allergies.  Home Medications   Prior to Admission medications   Medication Sig Start Date End  Date Taking? Authorizing Provider  benzonatate (TESSALON) 100 MG capsule Take 1 capsule (100 mg total) by mouth every 8 (eight) hours. 05/12/15   Noland Fordyce, PA-C  dextromethorphan-guaiFENesin (MUCINEX DM) 30-600 MG 12hr tablet Take 1 tablet by mouth 2 (two) times daily. Take with large glass of water 05/12/15   Noland Fordyce, PA-C  HYDROcodone-homatropine [facility]) 5-1.5 MG/5ML syrup Take 5 mLs by mouth every 6 (six) hours as needed for cough. 05/12/15   Noland Fordyce, PA-C  Multiple  Vitamins-Minerals (MULTIVITAMIN ADULT PO) Take by mouth.    Historical Provider, MD   Meds Ordered and Administered this Visit  Medications - No data to display  BP 117/80 mmHg  Pulse 91  Temp(Src) 98.8 F (37.1 C) (Oral)  Resp 14  Wt 206 lb (93.441 kg)  SpO2 100%  LMP 05/07/2015 No data found.   Physical Exam  Constitutional: She appears well-developed and well-nourished. No distress.  HENT:  Head: Normocephalic and atraumatic.  Right Ear: Hearing, tympanic membrane, external ear and ear canal normal.  Left Ear: Hearing, tympanic membrane, external ear and ear canal normal.  Nose: Nose normal.  Mouth/Throat: Uvula is midline, oropharynx is clear and moist and mucous membranes are normal.  Eyes: Conjunctivae are normal. No scleral icterus.  Neck: Normal range of motion. Neck supple.  Cardiovascular: Normal rate, regular rhythm and normal heart sounds.   Pulmonary/Chest: Effort normal and breath sounds normal. No respiratory distress. She has no wheezes. She has no rales. She exhibits no tenderness.  Abdominal: Soft. She exhibits no distension. There is no tenderness.  Musculoskeletal: Normal range of motion.  Neurological: She is alert.  Skin: Skin is warm and dry. She is not diaphoretic.  Nursing note and vitals reviewed.   ED Course  Procedures (including critical care time)  Labs Review Labs Reviewed - No data to display  Imaging Review Dg Chest 2 View  05/12/2015  CLINICAL DATA:  Cough for 3 days EXAM: CHEST  2 VIEW COMPARISON:  None. FINDINGS: The heart size and mediastinal contours are within normal limits. There is no focal infiltrate, pulmonary edema, or pleural effusion. The visualized skeletal structures are unremarkable. IMPRESSION: No active cardiopulmonary disease. Electronically Signed   By: Abelardo Diesel M.D.   On: 05/12/2015 16:23      MDM   1. Cough   2. Acute upper respiratory infection     Pt c/o worsening cough with congestion for 3 days,  associated headache, rhinorrhea and body aches. Vitals: WNL, O2 Sat 100% on RA. No respiratory distress on exam. Lungs: CTAB Due to reports of worsening cough, will get CXR to r/o underlying pneumothorax, bronchitis, or pneumonia.  CXR: normal, no pneumothorax or pneumonia. Symptoms started just 3 days ago.  Symptoms likely viral in nature. Reassured pt of CXR. Rx: hycodan, tessalon, and mucinex. Home care instructions provided. F/u with PCP in 1 week if not improving, sooner if worsening. Patient verbalized understanding and agreement with treatment plan.  Noland Fordyce, PA-C 05/12/15 1935

## 2015-05-12 NOTE — Discharge Instructions (Signed)
Hycodan is a narcotic medication.  While taking, do not drink alcohol, drive, or perform any other activities that requires focus while taking these medications.   You may take 400-634m Ibuprofen (Motrin) every 6-8 hours for fever and pain  Alternate with Tylenol  You may take 5037mTylenol every 4-6 hours as needed for fever and pain  Follow-up with your primary care provider next week for recheck of symptoms if not improving.  Be sure to drink plenty of fluids and rest, at least 8hrs of sleep a night, preferably more while you are sick. Return urgent care or go to closest ER if you cannot keep down fluids/signs of dehydration, fever not reducing with Tylenol, difficulty breathing/wheezing, stiff neck, worsening condition, or other concerns (see below)

## 2015-06-17 ENCOUNTER — Ambulatory Visit (INDEPENDENT_AMBULATORY_CARE_PROVIDER_SITE_OTHER): Payer: 59

## 2015-06-17 ENCOUNTER — Encounter: Payer: Self-pay | Admitting: Podiatry

## 2015-06-17 ENCOUNTER — Ambulatory Visit (INDEPENDENT_AMBULATORY_CARE_PROVIDER_SITE_OTHER): Payer: 59 | Admitting: Podiatry

## 2015-06-17 VITALS — BP 130/89 | HR 80 | Resp 16 | Ht 68.0 in | Wt 200.0 lb

## 2015-06-17 DIAGNOSIS — M79671 Pain in right foot: Secondary | ICD-10-CM | POA: Diagnosis not present

## 2015-06-17 DIAGNOSIS — M722 Plantar fascial fibromatosis: Secondary | ICD-10-CM | POA: Diagnosis not present

## 2015-06-17 MED ORDER — DICLOFENAC SODIUM 75 MG PO TBEC: 75.0000 mg | DELAYED_RELEASE_TABLET | Freq: Two times a day (BID) | ORAL | Status: DC

## 2015-06-17 MED ORDER — TRIAMCINOLONE ACETONIDE 10 MG/ML IJ SUSP
10.0000 mg | Freq: Once | INTRAMUSCULAR | Status: AC
  Administered 2015-06-17: 10 mg

## 2015-06-17 NOTE — Patient Instructions (Signed)

## 2015-06-17 NOTE — Progress Notes (Signed)
   Subjective:    Patient ID: Teresa Wong, female    DOB: November 07, 1975, 39 y.o.   MRN: [id number]  HPI Patient presents with foot pain in their right foot; arch, heel and lateral side. Pt stated, "hurts more in the morning"; x7 months.   Review of Systems  HENT: Positive for hearing loss.   All other systems reviewed and are negative.      Objective:   Physical Exam        Assessment & Plan:

## 2015-06-18 NOTE — Progress Notes (Signed)
Subjective:     Patient ID: Teresa Wong, female   DOB: [birth year], 39 y.o.   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      Objective:   Physical Exam  Constitutional: She is oriented to person, place, and time.  Cardiovascular: Intact distal pulses.   Musculoskeletal: Normal range of motion.  Neurological: She is oriented to person, place, and time.  Skin: Skin is warm.  Nursing note and vitals reviewed.  neurovascular status intact muscle strength adequate range of motion within normal limits. Patient's noted to have exquisite discomfort plantar aspect heel right foot at the insertional point tendon into the calcaneus and into the central part of the heel with no pain in the heel bone itself. Moderate depression of the arch is noted and patient is found to have good digital perfusion and is well oriented 3     Assessment:     Exquisite discomfort with plantar fascial symptomatology right and moderate depression of the arch with mechanical imbalance    Plan:     H&P and condition reviewed with patient. Today injected the right plantar fascia 3 mg Kenalog 5 mg Xylocaine and applied fascial brace with instructions on usage. Gave instructions on physical therapy and discussed long-term orthotic therapy for this patient and placed on molded 15 mg daily

## 2015-07-01 ENCOUNTER — Ambulatory Visit: Payer: 59 | Admitting: Podiatry

## 2015-07-06 ENCOUNTER — Encounter: Payer: Self-pay | Admitting: Podiatry

## 2015-07-06 ENCOUNTER — Ambulatory Visit (INDEPENDENT_AMBULATORY_CARE_PROVIDER_SITE_OTHER): Payer: 59 | Admitting: Podiatry

## 2015-07-06 VITALS — BP 123/86 | HR 88 | Resp 16

## 2015-07-06 DIAGNOSIS — M722 Plantar fascial fibromatosis: Secondary | ICD-10-CM

## 2015-07-07 NOTE — Progress Notes (Signed)
Subjective:     Patient ID: Teresa Wong, female   DOB: [birth year], 39 y.o.   MRN: [id number]  HPI patient states my foot feels much better with diminished discomfort and diminished swelling in my heels   Review of Systems     Objective:   Physical Exam Neurovascular status intact muscle strength adequate with diminishment of discomfort in the plantar heel region bilateral with moderate depression of the arch still noted    Assessment:     Inflammatory fasciitis improving still present but him quite a bit improved with depression of the arch noted    Plan:     Reviewed condition and recommended long-term orthotics and scanned for custom orthotics to reduce stress against the feet. Advised on continued stretching and shoe gear modification

## 2015-07-27 ENCOUNTER — Ambulatory Visit (INDEPENDENT_AMBULATORY_CARE_PROVIDER_SITE_OTHER): Payer: 59

## 2015-07-27 DIAGNOSIS — M722 Plantar fascial fibromatosis: Secondary | ICD-10-CM

## 2015-07-27 NOTE — Patient Instructions (Signed)

## 2015-07-27 NOTE — Progress Notes (Signed)
   Subjective:    Patient ID: Teresa Wong, female    DOB: 09/02/1975, 39 y.o.   MRN: [id number]  HPI PUO on 07/27/15   Review of Systems     Objective:   Physical Exam        Assessment & Plan:

## 2015-10-19 ENCOUNTER — Encounter: Payer: Self-pay | Admitting: Emergency Medicine

## 2015-10-19 ENCOUNTER — Emergency Department
Admission: EM | Admit: 2015-10-19 | Discharge: 2015-10-19 | Disposition: A | Discharge status: Home / Self Care | Payer: 59 | Source: Home / Self Care | Attending: Family Medicine | Admitting: Family Medicine

## 2015-10-19 DIAGNOSIS — J111 Influenza due to unidentified influenza virus with other respiratory manifestations: Secondary | ICD-10-CM

## 2015-10-19 DIAGNOSIS — R69 Illness, unspecified: Principal | ICD-10-CM

## 2015-10-19 MED ORDER — BENZONATATE 200 MG PO CAPS: 200.0000 mg | ORAL_CAPSULE | Freq: Every day | ORAL | Status: DC

## 2015-10-19 MED ORDER — OSELTAMIVIR PHOSPHATE 75 MG PO CAPS: 75.0000 mg | ORAL_CAPSULE | Freq: Two times a day (BID) | ORAL | Status: DC

## 2015-10-19 NOTE — ED Notes (Signed)
Pt c/o cough, fever, and HA that started today.

## 2015-10-19 NOTE — Discharge Instructions (Signed)
Take plain guaifenesin (1275m extended release tabs such as Mucinex) twice daily, with plenty of water, for cough and congestion.  May add Pseudoephedrine (316m one or two every 4 to 6 hours) for sinus congestion.  Get adequate rest.   May use Afrin nasal spray (or generic oxymetazoline) twice daily for about 5 days and then discontinue.  Also recommend using saline nasal spray several times daily and saline nasal irrigation (AYR is a common brand).  Use Flonase nasal spray each morning after using Afrin nasal spray and saline nasal irrigation. Try warm salt water gargles for sore throat.  Stop all antihistamines for now, and other non-prescription cough/cold preparations. May take Ibuprofen 20030m4 tabs every 8 hours with food for chest/sternum discomfort, fever, body aches, etc.   Follow-up with family doctor if not improving about one week.

## 2015-10-19 NOTE — ED Provider Notes (Signed)
CSN: [id number]     Arrival date & time 10/19/15  8413 History   First MD Initiated Contact with Patient 10/19/15 2018     Chief Complaint  Patient presents with  . Cough      HPI Comments: Today patient developed flu-like illness including myalgias, headache, fever/chills, fatigue, and cough.  Also has mild nasal congestion and sore throat.  Cough is non-productive.  No pleuritic pain or shortness of breath.    The history is provided by the patient.    Past Medical History  Diagnosis Date  . Abnormal Pap smear   . History of LEEP (loop electrosurgical excision procedure) of cervix complicating pregnancy 2440  . Meningitis 1984  . Pregnancy induced hypertension 2010, 2012    History - resolved - no problems with current pregnancy 2015  . 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 Postpartum depression   . Deafness in right ear     From birth   Past Surgical History  Procedure Laterality Date  . Dilation and evacuation  12/18/2011    Procedure: DILATATION AND EVACUATION;  Surgeon: Daria Pastures, MD;  Location: [facility];  Service: Gynecology;  Laterality: N/A;  . Dilation and curettage of uterus  11/2011,08/2010,12/2007,08/2007    x4  . Colposcopy  09/2004  . Cervical biopsy  w/ loop electrode excision  2006  . Wisdom tooth extraction    . Cesarean section N/A 11/26/2013    Procedure: CESAREAN SECTION;  Surgeon: Daria Pastures, MD;  Location: [facility];  Service: Obstetrics;  Laterality: N/A;  . Tubal ligation Bilateral 11/26/2013    Procedure: BILATERAL TUBAL LIGATION;  Surgeon: Daria Pastures, MD;  Location: [facility];  Service: Obstetrics;  Laterality: Bilateral;   Family History  Problem Relation Age of Onset  . Hypertension Mother   . Hyperlipidemia Mother   . CAD Father 67    Bypass surgery 1.5 yrs ago   . Hyperlipidemia Father   . Cancer Father     bladder  . Heart disease Maternal Grandfather   . Colon cancer Maternal Grandmother    . Cancer Maternal Grandmother     colon  . Miscarriages / Stillbirths Paternal Grandmother    Social History  Substance Use Topics  . Smoking status: Never Smoker   . Smokeless tobacco: Never Used  . Alcohol Use: No   OB History    Gravida Para Term Preterm AB TAB SAB Ectopic Multiple Living   7 3 2 1 4  0 4 0 0 2     Review of Systems + sore throat + hoarse + cough + sneezing No pleuritic pain No wheezing + nasal congestion + post-nasal drainage No sinus pain/pressure No itchy/red eyes No earache No hemoptysis No SOB + fever, + chills No nausea No vomiting No abdominal pain No diarrhea No urinary symptoms No skin rash + fatigue + myalgias + headache Used OTC meds without relief  Allergies  Review of patient's allergies indicates no known allergies.  Home Medications   Prior to Admission medications   Medication Sig Start Date End Date Taking? Authorizing Provider  diclofenac (VOLTAREN) 75 MG EC tablet Take 1 tablet (75 mg total) by mouth 2 (two) times daily. 06/17/15   Wallene Huh, DPM   Meds Ordered and Administered this Visit  Medications - No data to display  BP 123/85 mmHg  Pulse 120  Temp(Src) 100 F (37.8 C) (Oral)  Wt 200  lb (90.719 kg)  SpO2 99%  LMP 09/29/2015  Breastfeeding? No No data found.   Physical Exam Nursing notes and Vital Signs reviewed. Appearance:  Patient appears stated age, and in no acute distress.  Patient is obese. Eyes:  Pupils are equal, round, and reactive to light and accomodation.  Extraocular movement is intact.  Conjunctivae are not inflamed  Ears:  Canals normal.  Tympanic membranes normal.  Nose:  Mildly congested turbinates.  No sinus tenderness.   Pharynx:  Normal Neck:  Supple.  Tender enlarged posterior/lateral nodes are palpated bilaterally  Lungs:  Clear to auscultation.  Breath sounds are equal.  Moving air well. Chest:  Distinct tenderness to palpation over the mid-sternum.  Heart:  Regular rate  and rhythm without murmurs, rubs, or gallops.  Abdomen:  Nontender without masses or hepatosplenomegaly.  Bowel sounds are present.  No CVA or flank tenderness.  Extremities:  No edema.  Skin:  No rash present.   ED Course  Procedures none    MDM   1. Influenza-like illness    Begin Tamiflu.   Prescription written for Benzonatate [facility]) to take at bedtime for night-time cough.  Take plain guaifenesin (1211m extended release tabs such as Mucinex) twice daily, with plenty of water, for cough and congestion.  May add Pseudoephedrine (380m one or two every 4 to 6 hours) for sinus congestion.  Get adequate rest.   May use Afrin nasal spray (or generic oxymetazoline) twice daily for about 5 days and then discontinue.  Also recommend using saline nasal spray several times daily and saline nasal irrigation (AYR is a common brand).  Use Flonase nasal spray each morning after using Afrin nasal spray and saline nasal irrigation. Try warm salt water gargles for sore throat.  Stop all antihistamines for now, and other non-prescription cough/cold preparations. May take Ibuprofen 20018m4 tabs every 8 hours with food for chest/sternum discomfort, fever, body aches, etc.   Follow-up with family doctor if not improving about one week.    SteKandra NicolasD 10/27/15 [phone]

## 2015-10-21 ENCOUNTER — Telehealth: Payer: Self-pay | Admitting: Emergency Medicine

## 2016-02-14 DIAGNOSIS — H52221 Regular astigmatism, right eye: Secondary | ICD-10-CM | POA: Diagnosis not present

## 2016-04-27 ENCOUNTER — Ambulatory Visit: Payer: 59 | Admitting: Family Medicine

## 2016-09-14 ENCOUNTER — Telehealth: Payer: 59 | Admitting: Family

## 2016-09-14 DIAGNOSIS — J111 Influenza due to unidentified influenza virus with other respiratory manifestations: Secondary | ICD-10-CM

## 2016-09-14 MED ORDER — OSELTAMIVIR PHOSPHATE 75 MG PO CAPS: 75.0000 mg | ORAL_CAPSULE | Freq: Two times a day (BID) | ORAL | 0 refills | Status: DC

## 2016-09-14 NOTE — Progress Notes (Signed)

## 2016-10-13 ENCOUNTER — Ambulatory Visit (HOSPITAL_COMMUNITY)
Admission: EM | Admit: 2016-10-13 | Discharge: 2016-10-13 | Disposition: A | Discharge status: Home / Self Care | Payer: 59 | Attending: Family Medicine | Admitting: Family Medicine

## 2016-10-13 ENCOUNTER — Encounter (HOSPITAL_COMMUNITY): Payer: Self-pay | Admitting: Family Medicine

## 2016-10-13 DIAGNOSIS — J0100 Acute maxillary sinusitis, unspecified: Secondary | ICD-10-CM | POA: Diagnosis not present

## 2016-10-13 DIAGNOSIS — J40 Bronchitis, not specified as acute or chronic: Secondary | ICD-10-CM | POA: Diagnosis not present

## 2016-10-13 MED ORDER — HYDROCODONE-HOMATROPINE 5-1.5 MG/5ML PO SYRP: 5.0000 mL | ORAL_SOLUTION | Freq: Four times a day (QID) | ORAL | 0 refills | Status: DC | PRN

## 2016-10-13 MED ORDER — AMOXICILLIN-POT CLAVULANATE 875-125 MG PO TABS: 1.0000 | ORAL_TABLET | Freq: Two times a day (BID) | ORAL | 0 refills | Status: DC

## 2016-10-13 NOTE — ED Triage Notes (Signed)
Pt  Reports      Symptoms  Of   Cough   Congestion         Productive    X   1  Week    Pt  Reports     Not  On  Any  meds  Pt     Appears    In  No  Acute  resp  Distress

## 2016-10-13 NOTE — ED Provider Notes (Signed)
[place]    CSN: [id number] Arrival date & time: 10/13/16  1542     History   Chief Complaint No chief complaint on file.   HPI Teresa Wong is a 41 y.o. female.   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 Over the last several days she's developed a productive cough, right-sided chest pain, and low-grade fever.  Patient's had no nausea or vomiting, is been no blood in her sputum, and she has no shortness of breath      Past Medical History:  Diagnosis Date  . Abnormal Pap smear   . 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  . 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 History of LEEP (loop electrosurgical excision procedure) of cervix complicating pregnancy 8502  . Meningitis 1984  . Postpartum depression   . Pregnancy induced hypertension 2010, 2012   History - resolved - no problems with current pregnancy 2015    Patient Active Problem List   Diagnosis Date Noted  . Vitamin D deficiency 01/12/2015  . Elevated LDL cholesterol level 01/12/2015  . Vitamin B12 deficiency (non anemic) 01/12/2015  . BMI 31.0-31.9,adult 11/29/2014  . Preventative health care 11/29/2014  . History of recurrent miscarriages, not currently pregnant 10/02/2011    Past Surgical History:  Procedure Laterality Date  . CERVICAL BIOPSY  W/ LOOP ELECTRODE EXCISION  2006  . CESAREAN SECTION N/A 11/26/2013   Procedure: CESAREAN SECTION;  Surgeon: Daria Pastures, MD;  Location: [facility];  Service: Obstetrics;  Laterality: N/A;  . COLPOSCOPY  09/2004  . DILATION AND CURETTAGE OF UTERUS  11/2011,08/2010,12/2007,08/2007   x4  . DILATION AND EVACUATION  12/18/2011   Procedure: DILATATION AND EVACUATION;  Surgeon: Daria Pastures, MD;  Location: [facility];  Service: Gynecology;  Laterality: N/A;  . TUBAL LIGATION Bilateral 11/26/2013     Procedure: BILATERAL TUBAL LIGATION;  Surgeon: Daria Pastures, MD;  Location: [facility];  Service: Obstetrics;  Laterality: Bilateral;  . WISDOM TOOTH EXTRACTION      OB History    Gravida Para Term Preterm AB Living   7 3 2 1 4 2    SAB TAB Ectopic Multiple Live Births   4 0 0 0 2       Home Medications    Prior to Admission medications   Medication Sig Start Date End Date Taking? Authorizing Provider  amoxicillin-clavulanate (AUGMENTIN) 875-125 MG tablet Take 1 tablet by mouth every 12 (twelve) hours. 10/13/16   Robyn Haber, MD  benzonatate (TESSALON) 200 MG capsule Take 1 capsule (200 mg total) by mouth at bedtime. Take as needed for cough 10/19/15   Kandra Nicolas, MD  diclofenac (VOLTAREN) 75 MG EC tablet Take 1 tablet (75 mg total) by mouth 2 (two) times daily. 06/17/15   Wallene Huh, DPM  HYDROcodone-homatropine (HYDROMET) 5-1.5 MG/5ML syrup Take 5 mLs by mouth every 6 (six) hours as needed for cough. 10/13/16   Robyn Haber, MD  oseltamivir (TAMIFLU) 75 MG capsule Take 1 capsule (75 mg total) by mouth every 12 (twelve) hours. 10/19/15   Kandra Nicolas, MD  oseltamivir (TAMIFLU) 75 MG capsule Take 1 capsule (75 mg total) by mouth 2 (two) times daily. 09/14/16   Benjamine Mola, FNP    Family History Family History  Problem  Relation Age of Onset  . Hypertension Mother   . Hyperlipidemia Mother   . CAD Father 36    Bypass surgery 1.5 yrs ago   . Hyperlipidemia Father   . Cancer Father     bladder  . Heart disease Maternal Grandfather   . Colon cancer Maternal Grandmother   . Cancer Maternal Grandmother     colon  . Miscarriages / Stillbirths Paternal Grandmother     Social History Social History  Substance Use Topics  . Smoking status: Never Smoker  . Smokeless tobacco: Never Used  . Alcohol use No     Allergies   Patient has no known allergies.   Review of Systems Review of Systems  Constitutional: Negative.   HENT: Positive for congestion and  sinus pressure.   Eyes: Negative.   Respiratory: Positive for cough.   Cardiovascular: Positive for chest pain.  Gastrointestinal: Negative.   Genitourinary: Negative.   Neurological: Negative.      Physical Exam Triage Vital Signs ED Triage Vitals [10/13/16 1630]  Enc Vitals Group     BP 118/74     Pulse Rate 88     Resp 14     Temp 98.8 F (37.1 C)     Temp Source Oral     SpO2 96 %     Weight      Height      Head Circumference      Peak Flow      Pain Score      Pain Loc      Pain Edu?      Excl. in [place]?    No data found.   Updated Vital Signs BP 118/74 (BP Location: Right Arm)   Pulse 88   Temp 98.8 F (37.1 C) (Oral)   Resp 14   SpO2 96%    Physical Exam  Constitutional: She is oriented to person, place, and time. She appears well-developed and well-nourished.  HENT:  Head: Normocephalic.  Right Ear: External ear normal.  Left Ear: External ear normal.  Mouth/Throat: Oropharynx is clear and moist.  Eyes: Conjunctivae and EOM are normal. Pupils are equal, round, and reactive to light.  Neck: Normal range of motion. Neck supple.  Cardiovascular: Normal rate, regular rhythm and normal heart sounds.   Pulmonary/Chest: Effort normal and breath sounds normal.  Musculoskeletal: Normal range of motion.  Neurological: She is alert and oriented to person, place, and time.  Skin: Skin is warm and dry.  Nursing note and vitals reviewed.    UC Treatments / Results  Labs (all labs ordered are listed, but only abnormal results are displayed) Labs Reviewed - No data to display  EKG  EKG Interpretation None       Radiology No results found.  Procedures Procedures (including critical care time)  Medications Ordered in UC Medications - No data to display   Initial Impression / Assessment and Plan / UC Course  I have reviewed the triage vital signs and the nursing notes.  Pertinent labs & imaging results that were available during my care of the  patient were reviewed by me and considered in my medical decision making (see chart for details).     Final Clinical Impressions(s) / UC Diagnoses   Final diagnoses:  Acute maxillary sinusitis, recurrence not specified  Bronchitis    New Prescriptions New Prescriptions   AMOXICILLIN-CLAVULANATE (AUGMENTIN) 875-125 MG TABLET    Take 1 tablet by mouth every 12 (twelve) hours.   HYDROCODONE-HOMATROPINE (HYDROMET)  5-1.5 MG/5ML SYRUP    Take 5 mLs by mouth every 6 (six) hours as needed for cough.     Robyn Haber, MD 10/13/16 [phone]

## 2016-12-21 ENCOUNTER — Encounter: Payer: Self-pay | Admitting: Family Medicine

## 2016-12-21 ENCOUNTER — Ambulatory Visit (INDEPENDENT_AMBULATORY_CARE_PROVIDER_SITE_OTHER): Payer: 59 | Admitting: Family Medicine

## 2016-12-21 VITALS — BP 133/83 | HR 91 | Temp 98.3°F | Resp 16 | Ht 68.0 in | Wt 219.2 lb

## 2016-12-21 DIAGNOSIS — N6001 Solitary cyst of right breast: Secondary | ICD-10-CM

## 2016-12-21 MED ORDER — CEPHALEXIN 500 MG PO CAPS: 500.0000 mg | ORAL_CAPSULE | Freq: Three times a day (TID) | ORAL | 0 refills | Status: DC

## 2016-12-21 MED FILL — CEPHALEXIN 500 MG CAPSULE: 500 | 5 days supply | Qty: 15 | Fill #0

## 2016-12-21 NOTE — Progress Notes (Signed)
OFFICE VISIT  12/24/2016   CC:  Chief Complaint  Patient presents with  . Abscess    ? red, hard, painful lump on chest/right breast x 3 days   HPI:    Patient is a 41 y.o. Caucasian female who presents for lump felt by pt on right breast. Felt painful, red lump in medial R breast about 3 d/a.  Progressively more painful, warm to touch. Her LMP was 12/10/16.  She feels a normal amount of breast lumpiness and tenderness diffusely in both breasts when menstruating but pt states this is not the same at all.  No FH of breast cancer.  Past Medical History:  Diagnosis Date  . Abnormal Pap smear   . 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  . 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 History of LEEP (loop electrosurgical excision procedure) of cervix complicating pregnancy 9629  . Meningitis 1984  . Postpartum depression   . Pregnancy induced hypertension 2010, 2012   History - resolved - no problems with current pregnancy 2015    Past Surgical History:  Procedure Laterality Date  . CERVICAL BIOPSY  W/ LOOP ELECTRODE EXCISION  2006  . CESAREAN SECTION N/A 11/26/2013   Procedure: CESAREAN SECTION;  Surgeon: Daria Pastures, MD;  Location: [facility];  Service: Obstetrics;  Laterality: N/A;  . COLPOSCOPY  09/2004  . DILATION AND CURETTAGE OF UTERUS  11/2011,08/2010,12/2007,08/2007   x4  . DILATION AND EVACUATION  12/18/2011   Procedure: DILATATION AND EVACUATION;  Surgeon: Daria Pastures, MD;  Location: [facility];  Service: Gynecology;  Laterality: N/A;  . TUBAL LIGATION Bilateral 11/26/2013   Procedure: BILATERAL TUBAL LIGATION;  Surgeon: Daria Pastures, MD;  Location: [facility];  Service: Obstetrics;  Laterality: Bilateral;  . WISDOM TOOTH EXTRACTION      Outpatient Medications Prior to Visit  Medication Sig Dispense Refill  . amoxicillin-clavulanate (AUGMENTIN) 875-125 MG tablet Take 1 tablet by mouth every 12 (twelve) hours. (Patient not taking:  Reported on 12/21/2016) 14 tablet 0  . benzonatate (TESSALON) 200 MG capsule Take 1 capsule (200 mg total) by mouth at bedtime. Take as needed for cough (Patient not taking: Reported on 12/21/2016) 12 capsule 0  . diclofenac (VOLTAREN) 75 MG EC tablet Take 1 tablet (75 mg total) by mouth 2 (two) times daily. (Patient not taking: Reported on 12/21/2016) 50 tablet 2  . HYDROcodone-homatropine (HYDROMET) 5-1.5 MG/5ML syrup Take 5 mLs by mouth every 6 (six) hours as needed for cough. (Patient not taking: Reported on 12/21/2016) 120 mL 0  . oseltamivir (TAMIFLU) 75 MG capsule Take 1 capsule (75 mg total) by mouth every 12 (twelve) hours. (Patient not taking: Reported on 12/21/2016) 10 capsule 0  . oseltamivir (TAMIFLU) 75 MG capsule Take 1 capsule (75 mg total) by mouth 2 (two) times daily. (Patient not taking: Reported on 12/21/2016) 10 capsule 0   No facility-administered medications prior to visit.     No Known Allergies  ROS As per HPI  PE: Blood pressure 133/83, pulse 91, temperature 98.3 F (36.8 C), temperature source Oral, resp. rate 16, height 5' 8"  (1.727 m), weight 219 lb 4 oz (99.5 kg), last menstrual period 12/10/2016, SpO2 99 %. Gen: Alert, well appearing.  Patient is oriented to person, place, time, and situation. AFFECT: pleasant, lucid thought and speech. Breasts are symmetric.  No dominant, discrete, fixed  or suspicious masses are noted.  No skin or nipple  changes or axillary nodes. Medial aspect of R breast has a 2-3 cm round, tender, rubbery subQ nodular lesion with overlying erythema and warmth.  LABS:  none  IMPRESSION AND PLAN:  Right breast cyst:  I& D done today. Procedure: Incision and drainage of abscess/cyst.  The indication for the procedure was explained to the patient, benefits and risks of procedure were outlined for patient, patient agreed to proceed.  Steps of the procedure were clearly explained to the patient prior to starting. Injected lesion with 3 ml of 1%  lidocaine w/epi for local anesthesia.  Incised central portion of lesion with #5 punch bx and used manual pressure and hemostats to express contents and encourage complete drainage.  Culture swab of lesion contents obtained and sent to lab.  Wound packed  and dressed.  No bleeding.  Patient tolerated procedure well.  No immediate complications.  Wound care instructions given.  Warning signs of infection discussed. Follow up discussed.  Call or return for problems. Keflex 500 tid x 5d.  An After Visit Summary was printed and given to the patient.  FOLLOW UP: Return 5-7d recheck wound, with Dr. Raoul Pitch or myself.  Signed:  Crissie Sickles, MD           12/24/2016

## 2016-12-24 LAB — WOUND CULTURE: Organism ID, Bacteria: NO GROWTH

## 2016-12-28 ENCOUNTER — Encounter: Payer: Self-pay | Admitting: Family Medicine

## 2016-12-28 ENCOUNTER — Ambulatory Visit (INDEPENDENT_AMBULATORY_CARE_PROVIDER_SITE_OTHER): Payer: 59 | Admitting: Family Medicine

## 2016-12-28 VITALS — BP 126/86 | HR 94 | Temp 98.9°F | Resp 20 | Ht 68.0 in | Wt 215.8 lb

## 2016-12-28 DIAGNOSIS — Z6832 Body mass index (BMI) 32.0-32.9, adult: Secondary | ICD-10-CM

## 2016-12-28 DIAGNOSIS — N611 Abscess of the breast and nipple: Secondary | ICD-10-CM | POA: Diagnosis not present

## 2016-12-28 DIAGNOSIS — R635 Abnormal weight gain: Secondary | ICD-10-CM

## 2016-12-28 NOTE — Patient Instructions (Addendum)
It looks great, healing well.  Use bag balm over area daily to help with cosmetic/healing.    http://www.calculator.net/calorie-calculator.html Plug that number into my fitness pal... Update that number at least every 2 weeks with weight loss.

## 2016-12-28 NOTE — Progress Notes (Signed)
Teresa Wong , 1976/02/03, 41 y.o., female MRN: [id number] Patient Care Team    Relationship Specialty Notifications Start End  Ma Hillock, DO PCP - General Family Medicine  03/04/15     Chief Complaint  Patient presents with  . Wound Check    right breast     Subjective:  Right breast abscess: Patient presents for follow-up after incision and drainage of right breast one week ago. Wound cultures are negative. Patient completed her antibiotic of Keflex. She reports the pain is much result, she is feeling well, no redness or drainage to report.  Weight loss: Patient states that she's having difficulty with her weight loss. She is counting her calories, exercising some. She is asking whether a good goal weight for her is. No flowsheet data found.  No Known Allergies Social History  Substance Use Topics  . Smoking status: Never Smoker  . Smokeless tobacco: Never Used  . Alcohol use No   Past Medical History:  Diagnosis Date  . Abnormal Pap smear   . 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  . 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 History of LEEP (loop electrosurgical excision procedure) of cervix complicating pregnancy 3888  . Meningitis 1984  . Postpartum depression   . Pregnancy induced hypertension 2010, 2012   History - resolved - no problems with current pregnancy 2015   Past Surgical History:  Procedure Laterality Date  . CERVICAL BIOPSY  W/ LOOP ELECTRODE EXCISION  2006  . CESAREAN SECTION N/A 11/26/2013   Procedure: CESAREAN SECTION;  Surgeon: Daria Pastures, MD;  Location: [facility];  Service: Obstetrics;  Laterality: N/A;  . COLPOSCOPY  09/2004  . DILATION AND CURETTAGE OF UTERUS  11/2011,08/2010,12/2007,08/2007   x4  . DILATION AND EVACUATION  12/18/2011   Procedure: DILATATION AND EVACUATION;  Surgeon: Daria Pastures, MD;  Location: [facility];  Service: Gynecology;  Laterality: N/A;  . TUBAL LIGATION Bilateral 11/26/2013   Procedure: BILATERAL TUBAL LIGATION;  Surgeon: Daria Pastures, MD;  Location: [facility];  Service: Obstetrics;  Laterality: Bilateral;  . WISDOM TOOTH EXTRACTION     Family History  Problem Relation Age of Onset  . Hypertension Mother   . Hyperlipidemia Mother   . CAD Father 63       Bypass surgery 1.5 yrs ago   . Hyperlipidemia Father   . Cancer Father        bladder  . Heart disease Maternal Grandfather   . Colon cancer Maternal Grandmother   . Cancer Maternal Grandmother        colon  . Miscarriages / Stillbirths Paternal Grandmother    Allergies as of 12/28/2016   No Known Allergies     Medication List       Accurate as of 12/28/16  4:02 PM. Always use your most recent med list.          multivitamin tablet Take 1 tablet by mouth daily.       All past medical history, surgical history, allergies, family history, immunizations andmedications were updated in the EMR today and reviewed under the history and medication portions of their EMR.     ROS: Negative, with the exception of above mentioned in HPI   Objective:  BP 126/86 (BP Location: Left Arm, Patient Position: Sitting, Cuff Size: Large)   Pulse 94   Temp 98.9 F (37.2 C)   Resp 20  Ht 5' 8"  (1.727 m)   Wt 215 lb 12 oz (97.9 kg)   LMP 12/10/2016   SpO2 97%   BMI 32.80 kg/m  Body mass index is 32.8 kg/m. Gen: Afebrile. No acute distress. Nontoxic in appearance, well developed, well nourished. Obese Caucasian female. Skin: No redness, no drainage. Well healing wound. No rashes, purpura or petechiae.  Neuro: Normal gait. PERLA. EOMi. Alert. Oriented x3   No exam data present No results found. No results found for this or any previous visit (from the past 24 hour(s)).  Assessment/Plan: NYA MONDS is a 41 y.o. female present for OV for  BMI 32.0-32.9,adult Weight gain - New - Reviewed flowsheet and her BMI was 27 when she was 180 pounds. An ultimate goal for her would be approximately 170  pounds, however advised her keep that number in the back of her head and make a few smaller goals for herself, milestones for motivation. - Patient was advised to use a calorie calculator, provided website to her today to find out her base caloric need. Advised her to use the calorie recommendations are the loss of 1 pound per week. She is to make certain she calculates every 2 weeks as she is losing weight. Entering the calculator result into her myfittnesspal app.  - Exercise greater than 150 minutes a week. - She would like additional assistance, would be happy to discuss in a follow-up appointment or referred to a weight loss management program.  Breast abscess - Healing well. Review cultures are negative. - Keep area clean and dry, can use a little ointment/bag balm to aid with healing process. Patient was encouraged to continue to her bra 24 hours for support to help with healing.   Reviewed expectations re: course of current medical issues.  Discussed self-management of symptoms.  Outlined signs and symptoms indicating need for more acute intervention.  Patient verbalized understanding and all questions were answered.  Patient received an After-Visit Summary.   Note is dictated utilizing voice recognition software. Although note has been proof read prior to signing, occasional typographical errors still can be missed. If any questions arise, please do not hesitate to call for verification.   electronically signed by:  Howard Pouch, DO  [place]

## 2017-01-04 ENCOUNTER — Encounter: Payer: 59 | Admitting: Family Medicine

## 2017-02-22 ENCOUNTER — Encounter: Payer: 59 | Admitting: Family Medicine

## 2017-05-20 ENCOUNTER — Ambulatory Visit (INDEPENDENT_AMBULATORY_CARE_PROVIDER_SITE_OTHER): Payer: 59 | Admitting: Family Medicine

## 2017-05-20 ENCOUNTER — Encounter: Payer: Self-pay | Admitting: Family Medicine

## 2017-05-20 VITALS — BP 140/95 | HR 100 | Resp 16 | Wt 224.0 lb

## 2017-05-20 DIAGNOSIS — R200 Anesthesia of skin: Secondary | ICD-10-CM | POA: Diagnosis not present

## 2017-05-20 DIAGNOSIS — E538 Deficiency of other specified B group vitamins: Secondary | ICD-10-CM | POA: Diagnosis not present

## 2017-05-20 DIAGNOSIS — G629 Polyneuropathy, unspecified: Secondary | ICD-10-CM | POA: Diagnosis not present

## 2017-05-20 DIAGNOSIS — R202 Paresthesia of skin: Secondary | ICD-10-CM

## 2017-05-20 DIAGNOSIS — E559 Vitamin D deficiency, unspecified: Secondary | ICD-10-CM | POA: Diagnosis not present

## 2017-05-20 MED ORDER — PREDNISONE 20 MG PO TABS: ORAL_TABLET | ORAL | 0 refills | Status: DC

## 2017-05-20 MED FILL — predniSONE 20 MG TABS: 20 | 10 days supply | Qty: 18 | Fill #0

## 2017-05-20 NOTE — Patient Instructions (Signed)
Chronic Inflammatory Demyelinating Polyneuropathy Chronic inflammatory demyelinating polyneuropathy (CIDP) is a condition in which the nerves in an extremity, such as a hand or foot, do not work as they should. CIDP is associated with a disorder called Guillain-Barre syndrome, but it can also occur along with a blood disorder or another condition, such as diabetes, HIV (human immunodeficiency virus), hepatitis B or C infection, Waldenstrom macroglobulinemia, multiple myeloma, and osteosclerotic myeloma. What are the causes? Your nerves are surrounded by a covering called the myelin sheath. This condition is caused by damage to this covering. In the most common form of this condition, the damage is thought to be caused by the immune system accidentally attacking the myelin sheath. What are the signs or symptoms? Symptoms of this condition include:  Numbness or tingling of the hands and feet.  Weakness.  Problems walking.  Weak or absent reflexes.  How is this diagnosed? This condition may be diagnosed with:  A physical exam.  A nerve biopsy. This involves removing a small piece of a nerve and examining it under a microscope.  Tests that show how well the nerves are working, such as: ? Nerve conduction studies. ? An electromyogram.  How is this treated? This condition may be treated with:  Immunoglobulin therapy. This treatment decreases the activity of your body's defense system (immune system) against your nerves.  Medicines that suppress the immune system (immunosuppressant drugs), such as steroids.  Blood plasma exchange. This can be done to remove harmful immune system elements from your blood.  Physical therapy. This may help improve your symptoms.  Braces. These may be used to help support a weakened arm or leg.  Follow these instructions at home:  Take over-the-counter and prescription medicines only as told by your health care provider.  Keep all follow-up visits as  told by your health care provider. This is important.  Do physical therapy exercises as told by your physical therapist or health care provider.  If you were given a brace, make sure to wear it and care for it as told by your health care provider. Contact a health care provider if:  You have new symptoms.  Your symptoms get worse. This information is not intended to replace advice given to you by your health care provider. Make sure you discuss any questions you have with your health care provider. Document Released: 04/07/2002 Document Revised: 02/12/2016 Document Reviewed: 01/17/2016 Elsevier Interactive Patient Education  [employer].

## 2017-05-20 NOTE — Progress Notes (Signed)
Teresa Wong , 1975-11-26, 41 y.o., female MRN: [id number] Patient Care Team    Relationship Specialty Notifications Start End  Ma Hillock, DO PCP - General Family Medicine  03/04/15     Chief Complaint  Patient presents with  . Numbness    bilateral leg/ low back pain     Subjective: Pt presents for an OV with complaints of Lateral lower extremity tingling of 3 days duration.  Patient reports she was in her normal state of health when she noticed Friday morning her "feet felt weird". She did not pay much attention to it until over the weekend where she noticed it had spread to her bilateral lower extremities from the knees down and posterior thigh. She states she can feel, her sensation is intact, she can feel temperature but they feel "asleep". She is walking and carrying out her normal daily functions with ease. She doesn't feel her legs are weak. She denies any bladder or bowel dysfunction, difficulty swallowing, numbness or tingling in her upper extremities. She denies any fever, chills, nausea, vomit or diarrhea. She denies any acute illness or immunizations. She reports her daughter with ill the day before her onset which what they thought was food poisoning. She reports she's had very mild low back pain frequently, she does not feel her back is any worse than any other time. She has not needed to take any medications for her discomfort.  No flowsheet data found.  No Known Allergies Social History  Substance Use Topics  . Smoking status: Never Smoker  . Smokeless tobacco: Never Used  . Alcohol use No   Past Medical History:  Diagnosis Date  . Abnormal Pap smear   . 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  . 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 History of LEEP (loop electrosurgical excision procedure) of cervix complicating pregnancy 2353  . Meningitis 1984  . Postpartum depression   . Pregnancy induced hypertension 2010, 2012   History - resolved - no problems with current pregnancy 2015   Past Surgical History:  Procedure Laterality Date  . CERVICAL BIOPSY  W/ LOOP ELECTRODE EXCISION  2006  . CESAREAN SECTION N/A 11/26/2013   Procedure: CESAREAN SECTION;  Surgeon: Daria Pastures, MD;  Location: [facility];  Service: Obstetrics;  Laterality: N/A;  . COLPOSCOPY  09/2004  . DILATION AND CURETTAGE OF UTERUS  11/2011,08/2010,12/2007,08/2007   x4  . DILATION AND EVACUATION  12/18/2011   Procedure: DILATATION AND EVACUATION;  Surgeon: Daria Pastures, MD;  Location: [facility];  Service: Gynecology;  Laterality: N/A;  . TUBAL LIGATION Bilateral 11/26/2013   Procedure: BILATERAL TUBAL LIGATION;  Surgeon: Daria Pastures, MD;  Location: [facility];  Service: Obstetrics;  Laterality: Bilateral;  . WISDOM TOOTH EXTRACTION     Family History  Problem Relation Age of Onset  . Hypertension Mother   . Hyperlipidemia Mother   . CAD Father 65       Bypass surgery 1.5 yrs ago   . Hyperlipidemia Father   . Cancer Father        bladder  . Heart disease Maternal Grandfather   . Colon cancer Maternal Grandmother   . Cancer Maternal Grandmother        colon  . Miscarriages / Stillbirths Paternal Grandmother    Allergies as of 05/20/2017   No Known Allergies     Medication List  Accurate as of 05/20/17  2:34 PM. Always use your most recent med list.          multivitamin tablet Take 1 tablet by mouth daily.       All past medical history, surgical history, allergies, family history, immunizations andmedications were updated in the EMR today and reviewed under the history and medication portions of their EMR.     ROS: Negative, with the exception of above mentioned in HPI   Objective:  BP (!) 140/95 (BP Location: Left Arm, Patient Position: Sitting, Cuff Size: Large)   Pulse 100   Resp 16   Wt 224 lb (101.6 kg)   SpO2 99%   BMI 34.06 kg/m  Body mass index is 34.06 kg/m. Gen: Afebrile. No acute distress.  Nontoxic in appearance, well developed, well nourished. Pleasant, Caucasian female. Smiling, does not appear uncomfortable. HENT: AT. [place]. MMM, no oral lesions. Bilateral nares without erythema or swelling. Throat without erythema or exudates. No cough, no hoarseness. Eyes:Pupils Equal Round Reactive to light, Extraocular movements intact,  Conjunctiva without redness, discharge or icterus. Neck/lymp/endocrine: Supple, no lymphadenopathy CV: RRR, no edema Chest: CTAB, no wheeze or crackles. Good air movement, normal resp effort.  Abd: Soft. NTND. BS present.  Skin: No rashes, purpura or petechiae.  Neuro/MSK: Normal gait. PERLA. EOMi. Alert. Oriented x3 Cranial nerves II through XII intact. Sensation intact to temperature and touch bilateral lower extremities. Muscle strength 5/5 bilateral upper and lower extremity. DTRs present and equal bilateral lower extremity. Lumbar spine: No erythema, no tenderness to palpation paraspinal or bony tenderness. No SI tenderness. Full range of motion without discomfort. Negative SLR bilaterally. Negative FABRE bilaterally. Psych: Normal affect, dress and demeanor. Normal speech. Normal thought content and judgment.  No exam data present No results found. No results found for this or any previous visit (from the past 24 hour(s)).  Assessment/Plan: Teresa Wong is a 41 y.o. female present for OV for  Vitamin D deficiency - Vitamin D (25 hydroxy) Vitamin B12 deficiency (non anemic) - B12 Numbness and tingling of both legs below knees - Uncertain etiology, certainly concerned for Guillain Barre/ ascending type history of her numbness and tingling. Discussed this in great detail with patient today and stressed the need if any worsening symptoms, bladder or bowel dysfunction, weakness patient is to go straight to the emergency room. - Patient does not present with any acute illness, however her daughter had been ill w/ GI illness ? Exposure to a virus causing  symptoms. - ANA negative 2015 - CBC w/Diff - Basic Metabolic Panel (BMET) - TSH - HgB A1c - C-reactive protein - Sedimentation rate - Hepatitis, Acute - Prednisone taper 10 days - Neurology referral placed urgently - Follow-up 3-7 days.   Reviewed expectations re: course of current medical issues.  Discussed self-management of symptoms.  Outlined signs and symptoms indicating need for more acute intervention.  Patient verbalized understanding and all questions were answered.  Patient received an After-Visit Summary.    No orders of the defined types were placed in this encounter.    Note is dictated utilizing voice recognition software. Although note has been proof read prior to signing, occasional typographical errors still can be missed. If any questions arise, please do not hesitate to call for verification.   electronically signed by:  Howard Pouch, DO  [place]

## 2017-05-21 ENCOUNTER — Telehealth: Payer: Self-pay | Admitting: Family Medicine

## 2017-05-21 DIAGNOSIS — R200 Anesthesia of skin: Secondary | ICD-10-CM

## 2017-05-21 DIAGNOSIS — R202 Paresthesia of skin: Secondary | ICD-10-CM | POA: Insufficient documentation

## 2017-05-21 HISTORY — DX: Paresthesia of skin: R20.0

## 2017-05-21 HISTORY — DX: Anesthesia of skin: R20.2

## 2017-05-21 LAB — CBC WITH DIFFERENTIAL/PLATELET
BASOS PCT: 1.1 %
Basophils Absolute: 81 cells/uL (ref 0–200)
Eosinophils Absolute: 81 cells/uL (ref 15–500)
Eosinophils Relative: 1.1 %
HCT: 37.5 % (ref 35.0–45.0)
Hemoglobin: 13 g/dL (ref 11.7–15.5)
Lymphs Abs: 2405 cells/uL (ref 850–3900)
MCH: 29.5 pg (ref 27.0–33.0)
MCHC: 34.7 g/dL (ref 32.0–36.0)
MCV: 85 fL (ref 80.0–100.0)
MONOS PCT: 5.8 %
MPV: 10.1 fL (ref 7.5–12.5)
NEUTROS PCT: 59.5 %
Neutro Abs: 4403 cells/uL (ref 1500–7800)
PLATELETS: 370 10*3/uL (ref 140–400)
RBC: 4.41 10*6/uL (ref 3.80–5.10)
RDW: 13 % (ref 11.0–15.0)
Total Lymphocyte: 32.5 %
WBC mixed population: 429 cells/uL (ref 200–950)
WBC: 7.4 10*3/uL (ref 3.8–10.8)

## 2017-05-21 LAB — HEMOGLOBIN A1C
Hgb A1c MFr Bld: 5.1 % of total Hgb (ref <5.7)
MEAN PLASMA GLUCOSE: 100 (calc)
eAG (mmol/L): 5.5 (calc)

## 2017-05-21 LAB — BASIC METABOLIC PANEL
BUN: 7 mg/dL (ref 7–25)
CHLORIDE: 102 mmol/L (ref 98–110)
CO2: 23 mmol/L (ref 20–32)
Calcium: 9.3 mg/dL (ref 8.6–10.2)
Creat: 0.53 mg/dL (ref 0.50–1.10)
GLUCOSE: 101 mg/dL — AB (ref 65–99)
Potassium: 4.3 mmol/L (ref 3.5–5.3)
Sodium: 139 mmol/L (ref 135–146)

## 2017-05-21 LAB — TSH: TSH: 0.88 m[IU]/L

## 2017-05-21 LAB — HEPATITIS PANEL, ACUTE
HEP C AB: NONREACTIVE
Hep A IgM: NONREACTIVE
Hep B C IgM: NONREACTIVE
Hepatitis B Surface Ag: NONREACTIVE
SIGNAL TO CUT-OFF: 0.01 (ref <1.00)

## 2017-05-21 LAB — C-REACTIVE PROTEIN: CRP: 1.2 mg/L (ref <8.0)

## 2017-05-21 LAB — VITAMIN D 25 HYDROXY (VIT D DEFICIENCY, FRACTURES): Vit D, 25-Hydroxy: 17 ng/mL — ABNORMAL LOW (ref 30–100)

## 2017-05-21 LAB — SEDIMENTATION RATE: Sed Rate: 9 mm/h (ref 0–20)

## 2017-05-21 LAB — VITAMIN B12: Vitamin B-12: 296 pg/mL (ref 200–1100)

## 2017-05-21 MED ORDER — VITAMIN D (ERGOCALCIFEROL) 1.25 MG (50000 UNIT) PO CAPS: 50000.0000 [IU] | ORAL_CAPSULE | ORAL | 0 refills | Status: DC

## 2017-05-21 MED FILL — VIT D2 1.25 MG (50,000 UNIT: 1.25 MG | 90 days supply | Qty: 12 | Fill #0

## 2017-05-21 NOTE — Telephone Encounter (Signed)
Please call pt: - her labs are all normal with the exception of low vit d. I have concerns this is a type of neurological disorder that the body accidentally attacks its own nerves (autoimmune). I have placed a referral to Neurology for their recommendations. If it is an early [place] or like illness, it can progress over the next few weeks and we would want her to have neurology on board before then. Of course, if worsening, even a fraction, go to ED. Follow up here is already scheduled.  - doubt Vit D (17)  is the cause to her symptoms at that level, but can cause musculoskeletal symptoms. Prescribed once weekly dosing for 12 weeks and she should be encouraged to take OTC vit d 800 u daily even after supplement. 12 week follow up.  -

## 2017-05-22 NOTE — Telephone Encounter (Signed)
Patient notified and verbalized understanding. 

## 2017-05-23 ENCOUNTER — Ambulatory Visit (INDEPENDENT_AMBULATORY_CARE_PROVIDER_SITE_OTHER): Payer: 59 | Admitting: Neurology

## 2017-05-23 ENCOUNTER — Encounter: Payer: Self-pay | Admitting: Neurology

## 2017-05-23 VITALS — BP 154/90 | HR 97 | Resp 16 | Ht 68.0 in | Wt 225.0 lb

## 2017-05-23 DIAGNOSIS — G373 Acute transverse myelitis in demyelinating disease of central nervous system: Secondary | ICD-10-CM

## 2017-05-23 DIAGNOSIS — E559 Vitamin D deficiency, unspecified: Secondary | ICD-10-CM | POA: Diagnosis not present

## 2017-05-23 DIAGNOSIS — R200 Anesthesia of skin: Secondary | ICD-10-CM

## 2017-05-23 DIAGNOSIS — M545 Low back pain, unspecified: Secondary | ICD-10-CM

## 2017-05-23 DIAGNOSIS — R202 Paresthesia of skin: Secondary | ICD-10-CM

## 2017-05-23 DIAGNOSIS — H469 Unspecified optic neuritis: Secondary | ICD-10-CM

## 2017-05-23 NOTE — Progress Notes (Signed)
[facility]  PATIENT: Teresa Wong DOB: [birth year]  REFERRING DOCTOR OR PCP:  Howard Pouch SOURCE: patient, notes from Dr. Raoul Pitch.  _________________________________   HISTORICAL  CHIEF COMPLAINT:  Chief Complaint  Patient presents with  . Numbness    Teresa Wong is here for eval of numbness from buttocks to toes. Above the knees numbnes is only posterior. Below the knees, numbness is circumferential. Some intermittent lbp discomfort with occasional shooting pains bilat legs, onset 3 yrs. ago after picking her son up out of his crib. Denies other sx/fim    HISTORY OF PRESENT ILLNESS:  I had the pleasure seeing the patient, Teresa Wong, at [facility] for neurologic consultation regarding the rapid onset numbness that she experienced a few days ago.     About a week ago, after sleeping on a sofa with her 32 yo daughter, she woke up with discomfort in the lower back and numbness from the buttocks down.   She thinks she was sleeping on her left side but a little bit twisted. Numbness is partial and she can feel heat/cold and sharp pain.   She has had a tingling with pins and needle sensation that has not resolved.    She denies any difficulty with her gait. She can stand with her eyes closed. There is no clumsiness or weakness in the legs. She denies any difficulty with her bladder. She does not note any symptoms in the arms or face. There is no change in vision.  She saw Dr. Raoul Pitch and was placed on steroids as a slow taper from 60 mg down. Over the last couple days she feels that there is some improvement of the numbness in the buttocks but she continues to have the tingling lower down in the leg.  Currently the symptoms are from the upper thighs down.  She has not had another episode of numbness like this in the past. However, she has had intermittent lower back discomfort since 3 years ago. Shortly after her second child was born she lifted him up and  had the onset of pain in the lower back. Since then, she would get episodes lasting a few days to a week or so of further discomfort that would usually resolve with time and over the counter medications.  She denied recent infection but her daughter had a mild GI viral syndrome 1-2 days before her symptoms.    She has no recent problem with headaches.          REVIEW OF SYSTEMS: Constitutional: No fevers, chills, sweats, or change in appetite.   She has gained some weight over the last year. Eyes: No visual changes, double vision, eye pain Ear, nose and throat: No hearing loss, ear pain, nasal congestion, sore throat Cardiovascular: No chest pain, palpitations Respiratory: No shortness of breath at rest or with exertion.   No wheezes GastrointestinaI: No nausea, vomiting, diarrhea, abdominal pain, fecal incontinence Genitourinary: No dysuria, urinary retention or frequency.  No nocturia. Musculoskeletal: No neck pain, back pain Integumentary: No rash, pruritus, skin lesions Neurological: as above Psychiatric: No depression at this time.  No anxiety Endocrine: No palpitations, diaphoresis, change in appetite or increased thirst Hematologic/Lymphatic: No anemia, purpura, petechiae. Allergic/Immunologic: No itchy/runny eyes, nasal congestion, recent allergic reactions, rashes  ALLERGIES: No Known Allergies  HOME MEDICATIONS:  Current Outpatient Prescriptions:  .  cholecalciferol (VITAMIN D) 400 units TABS tablet, Take 800 Units by mouth daily., Disp: , Rfl:  .  Multiple Vitamin (MULTIVITAMIN) tablet,  Take 1 tablet by mouth daily., Disp: , Rfl:  .  predniSONE (DELTASONE) 20 MG tablet, 60 mg x3d, 40 mg x3d, 20 mg x2d, 10 mg x2d, Disp: 18 tablet, Rfl: 0 .  Vitamin D, Ergocalciferol, (DRISDOL) 50000 units CAPS capsule, Take 1 capsule (50,000 Units total) by mouth every 7 (seven) days., Disp: 12 capsule, Rfl: 0  PAST MEDICAL HISTORY: Past Medical History:  Diagnosis Date  . Abnormal  Pap smear   . 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  . 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 History of LEEP (loop electrosurgical excision procedure) of cervix complicating pregnancy 9833  . Meningitis 1984  . Postpartum depression   . Pregnancy induced hypertension 2010, 2012   History - resolved - no problems with current pregnancy 2015    PAST SURGICAL HISTORY: Past Surgical History:  Procedure Laterality Date  . CERVICAL BIOPSY  W/ LOOP ELECTRODE EXCISION  2006  . CESAREAN SECTION N/A 11/26/2013   Procedure: CESAREAN SECTION;  Surgeon: Daria Pastures, MD;  Location: [facility];  Service: Obstetrics;  Laterality: N/A;  . COLPOSCOPY  09/2004  . DILATION AND CURETTAGE OF UTERUS  11/2011,08/2010,12/2007,08/2007   x4  . DILATION AND EVACUATION  12/18/2011   Procedure: DILATATION AND EVACUATION;  Surgeon: Daria Pastures, MD;  Location: [facility];  Service: Gynecology;  Laterality: N/A;  . TUBAL LIGATION Bilateral 11/26/2013   Procedure: BILATERAL TUBAL LIGATION;  Surgeon: Daria Pastures, MD;  Location: [facility];  Service: Obstetrics;  Laterality: Bilateral;  . WISDOM TOOTH EXTRACTION      FAMILY HISTORY: Family History  Problem Relation Age of Onset  . Hypertension Mother   . Hyperlipidemia Mother   . CAD Father 15       Bypass surgery 1.5 yrs ago   . Hyperlipidemia Father   . Cancer Father        bladder  . Bladder Cancer Father   . Diverticulitis Father   . Heart disease Maternal Grandfather   . Colon cancer Maternal Grandmother   . Cancer Maternal Grandmother        colon  . Miscarriages / Stillbirths Paternal Grandmother     SOCIAL HISTORY:  Social History   Social History  . Marital status: Married    Spouse name: N/A  . Number of children: 2  . Years of education: some colle   Occupational History  . Not on file.   Social History Main Topics  . Smoking status: Never Smoker  . Smokeless tobacco: Never Used  . Alcohol use  No  . Drug use: No  . Sexual activity: Yes    Birth control/ protection: Surgical     Comment: pregnant    Other Topics Concern  . Not on file   Social History Narrative   [name] lives with her husband & 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     PHYSICAL EXAM  Vitals:   05/23/17 0917  BP: (!) 154/90  Pulse: 97  Resp: 16  Weight: 225 lb (102.1 kg)  Height: 5' 8"  (1.727 m)    Body mass index is 34.21 kg/m.   General: The patient is well-developed and well-nourished and in no acute distress  Eyes:  Funduscopic exam shows normal optic discs and retinal vessels.  Neck: The neck is supple, no carotid bruits are noted.  The neck is nontender.  Cardiovascular: The heart has a regular rate and  rhythm with a normal S1 and S2. There were no murmurs, gallops or rubs. Lungs are clear to auscultation.  Skin: Extremities are without significant edema.  Musculoskeletal:  Back is nontender  Neurologic Exam  Mental status: The patient is alert and oriented x 3 at the time of the examination. The patient has apparent normal recent and remote memory, with an apparently normal attention span and concentration ability.   Speech is normal.  Cranial nerves: Extraocular movements are full. Pupils are equal, round, and reactive to light and accomodation.  Visual fields are full.  Facial symmetry is present. There is good facial sensation to soft touch bilaterally.Facial strength is normal.  Trapezius and sternocleidomastoid strength is normal. No dysarthria is noted.  The tongue is midline, and the patient has symmetric elevation of the soft palate. Mild reduced right hearing..  Motor:  Muscle bulk is normal.   Tone is normal. Strength is  5 / 5 in all 4 extremities.   Sensory: Sensory testing is intact to pinprick, soft touch and vibration sensation in the arms. In the legs, she had normal sensation to touch and temperature. There was slight reduced vibration sensation in the toes but  normal sensation at the knees..  Coordination: Cerebellar testing reveals good finger-nose-finger and heel-to-shin bilaterally.  Gait and station: Station is normal.   Gait is normal. Tandem gait is normal. Romberg is negative.   Reflexes: Deep tendon reflexes are symmetric and normal in 3) in the arms. Reflexes are increased in the knees with some spread. Reflexes are 3 in the ankles with no clonus..   Plantar responses are flexor.    DIAGNOSTIC DATA (LABS, IMAGING, TESTING) - I reviewed patient records, labs, notes, testing and imaging myself where available.  Lab Results  Component Value Date   WBC 7.4 05/20/2017   HGB 13.0 05/20/2017   HCT 37.5 05/20/2017   MCV 85.0 05/20/2017   PLT 370 05/20/2017      Component Value Date/Time   NA 139 05/20/2017 1504   K 4.3 05/20/2017 1504   CL 102 05/20/2017 1504   CO2 23 05/20/2017 1504   GLUCOSE 101 (H) 05/20/2017 1504   BUN 7 05/20/2017 1504   CREATININE 0.53 05/20/2017 1504   CALCIUM 9.3 05/20/2017 1504   PROT 6.8 11/26/2014 0904   ALBUMIN 4.3 11/26/2014 0904   AST 12 11/26/2014 0904   ALT 12 11/26/2014 0904   ALKPHOS 67 11/26/2014 0904   BILITOT 0.5 11/26/2014 0904   GFRNONAA >60 04/25/2011 1151   GFRAA >60 04/25/2011 1151   Lab Results  Component Value Date   CHOL 185 11/26/2014   HDL 46.40 11/26/2014   LDLCALC 127 (H) 11/26/2014   TRIG 57.0 11/26/2014   CHOLHDL 4 11/26/2014   Lab Results  Component Value Date   HGBA1C 5.1 05/20/2017   Lab Results  Component Value Date   VITAMINB12 296 05/20/2017   Lab Results  Component Value Date   TSH 0.88 05/20/2017       ASSESSMENT AND PLAN  Numbness and tingling of both legs  Acute midline low back pain without sciatica  Vitamin D deficiency   In summary, Teresa Wong is a 41 year old woman with bilateral leg numbness and hyperreflexia . The onset was abrupt about a week ago and symptoms have persisted but not worsened.    Due to the abruptness and stability  of her symptoms, I am most concerned about a herniated disc affecting the lower spinal cord or both sacral nerve roots.  We will check an MRI of the thoracic spine and MRI of the lumbar spine. I will do the MRI of the thoracic spine with and without contrast to both assess for compressive myelopathy as well as for inflammatory etiology.   We will also check a NCV/EMG to ensure that there is not a polyneuropathy. Due to the time course of her symptoms in her wrist stability, GBS and CIDP are not very likely but this test will help to rule this out.  We could cancel the EMG if symptoms improve significantly over the next week.  As the tingling is not too uncomfortable, I would not start any medication. If it does uncomfortable we could always consider gabapentin  or another agent.  I will see her for the NCV/EMG but she should call sooner if she notes new or worsening neurologic symptoms.  Thank you for asking me to see Teresa Wong. Please let me know if I can be of further assistance with her or other patients in the future.   Maddix Heinz A. Felecia Shelling, MD, [place] 16/04/9603, 5:40 AM Certified in Neurology, Clinical Neurophysiology, Sleep Medicine, Pain Medicine and Neuroimaging  [facility] [address], [place], [place] [address] [phone]

## 2017-05-27 ENCOUNTER — Encounter: Payer: Self-pay | Admitting: Family Medicine

## 2017-05-27 ENCOUNTER — Ambulatory Visit (INDEPENDENT_AMBULATORY_CARE_PROVIDER_SITE_OTHER): Payer: 59 | Admitting: Family Medicine

## 2017-05-27 VITALS — BP 128/82 | HR 83 | Temp 98.4°F | Resp 20 | Ht 68.0 in | Wt 224.2 lb

## 2017-05-27 DIAGNOSIS — R200 Anesthesia of skin: Secondary | ICD-10-CM | POA: Diagnosis not present

## 2017-05-27 DIAGNOSIS — R202 Paresthesia of skin: Secondary | ICD-10-CM | POA: Diagnosis not present

## 2017-05-27 NOTE — Patient Instructions (Signed)
I am glad you are at least doing better. I will follow with Dr. Felecia Shelling and if needed follow up here.   Watch for any rebound symptoms now that prednisone is tapering off.

## 2017-05-27 NOTE — Progress Notes (Signed)
Teresa Wong , 06/14/1976, 41 y.o., female MRN: [id number] Patient Care Team    Relationship Specialty Notifications Start End  Ma Hillock, DO PCP - General Family Medicine  03/04/15     Chief Complaint  Patient presents with  . Back Pain     Subjective:  Pt has established with Dr. Felecia Shelling (neuro) for further evaluation, which felt it was unlikely GBS or like syndrome. They have scheduled MRI of thoracic and Lumbar spine. Patient reports her symptoms are not worsening, they are just minimally improving. The tingling sensation has resolved in her upper thighs, and her feet do not tingle as much. But she still endorses numbness and tingling from her knees to her feet. She is about finished with the prednisone.  Prior note 05/20/2017:  Pt presents for an OV with complaints of Lateral lower extremity tingling of 3 days duration.  Patient reports she was in her normal state of health when she noticed Friday morning her "feet felt weird". She did not pay much attention to it until over the weekend where she noticed it had spread to her bilateral lower extremities from the knees down and posterior thigh. She states she can feel, her sensation is intact, she can feel temperature but they feel "asleep". She is walking and carrying out her normal daily functions with ease. She doesn't feel her legs are weak. She denies any bladder or bowel dysfunction, difficulty swallowing, numbness or tingling in her upper extremities. She denies any fever, chills, nausea, vomit or diarrhea. She denies any acute illness or immunizations. She reports her daughter with ill the day before her onset which what they thought was food poisoning. She reports she's had very mild low back pain frequently, she does not feel her back is any worse than any other time. She has not needed to take any medications for her discomfort.  Depression screen PHQ 2/9 05/27/2017  Decreased Interest 0  Down, Depressed, Hopeless 0  PHQ -  2 Score 0    No Known Allergies Social History  Substance Use Topics  . Smoking status: Never Smoker  . Smokeless tobacco: Never Used  . Alcohol use No   Past Medical History:  Diagnosis Date  . Abnormal Pap smear   . 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  . 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 History of LEEP (loop electrosurgical excision procedure) of cervix complicating pregnancy 1683  . Meningitis 1984  . Postpartum depression   . Pregnancy induced hypertension 2010, 2012   History - resolved - no problems with current pregnancy 2015   Past Surgical History:  Procedure Laterality Date  . CERVICAL BIOPSY  W/ LOOP ELECTRODE EXCISION  2006  . CESAREAN SECTION N/A 11/26/2013   Procedure: CESAREAN SECTION;  Surgeon: Daria Pastures, MD;  Location: [facility];  Service: Obstetrics;  Laterality: N/A;  . COLPOSCOPY  09/2004  . DILATION AND CURETTAGE OF UTERUS  11/2011,08/2010,12/2007,08/2007   x4  . DILATION AND EVACUATION  12/18/2011   Procedure: DILATATION AND EVACUATION;  Surgeon: Daria Pastures, MD;  Location: [facility];  Service: Gynecology;  Laterality: N/A;  . TUBAL LIGATION Bilateral 11/26/2013   Procedure: BILATERAL TUBAL LIGATION;  Surgeon: Daria Pastures, MD;  Location: [facility];  Service: Obstetrics;  Laterality: Bilateral;  . WISDOM TOOTH EXTRACTION     Family History  Problem Relation Age of Onset  . Hypertension Mother   .  Hyperlipidemia Mother   . CAD Father 71       Bypass surgery 1.5 yrs ago   . Hyperlipidemia Father   . Cancer Father        bladder  . Bladder Cancer Father   . Diverticulitis Father   . Heart disease Maternal Grandfather   . Colon cancer Maternal Grandmother   . Cancer Maternal Grandmother        colon  . Miscarriages / Stillbirths Paternal Grandmother    Allergies as of 05/27/2017   No Known Allergies     Medication List       Accurate as of 05/27/17  5:39 PM. Always use your most recent med  list.          cholecalciferol 400 units Tabs tablet Commonly known as:  VITAMIN D Take 800 Units by mouth daily.   multivitamin tablet Take 1 tablet by mouth daily.   predniSONE 20 MG tablet Commonly known as:  DELTASONE 60 mg x3d, 40 mg x3d, 20 mg x2d, 10 mg x2d   Vitamin D (Ergocalciferol) 50000 units Caps capsule Commonly known as:  DRISDOL Take 1 capsule (50,000 Units total) by mouth every 7 (seven) days.       All past medical history, surgical history, allergies, family history, immunizations andmedications were updated in the EMR today and reviewed under the history and medication portions of their EMR.     ROS: Negative, with the exception of above mentioned in HPI   Objective:  BP 128/82 (BP Location: Right Arm, Patient Position: Sitting, Cuff Size: Large)   Pulse 83   Temp 98.4 F (36.9 C)   Resp 20   Ht 5' 8"  (1.727 m)   Wt 224 lb 4 oz (101.7 kg)   SpO2 98%   BMI 34.10 kg/m  Body mass index is 34.1 kg/m. Gen: Afebrile. No acute distress. Pleasant Caucasian female. Eyes:Pupils Equal Round Reactive to light, Extraocular movements intact,  Conjunctiva without redness, discharge or icterus. CV: RRR Chest: CTAB, no wheeze or crackles Skin: no rashes, purpura or petechiae.  Neuro:  Normal gait. PERLA. EOMi. Alert. Oriented.  Psych: Normal affect, dress and demeanor. Normal speech. Normal thought content and judgment.   No exam data present No results found. No results found for this or any previous visit (from the past 24 hour(s)).  Assessment/Plan: Teresa Wong is a 41 y.o. female present for OV for  Numbness and tingling of both legs below knees - Mildly improved, but not resolved. Now following with neurology which has plans to complete an MRI of the thoracic and lumbar spine this week. All labs reviewed in detail with patient today and discussed they are rather unyielding on etiology of her symptoms. - Discussed awaiting MRI results before moving  forward. May consider physical therapy or medication for numbness and tingling such as Lyrica. - Reviewed neurology note. As long as following with neurology, she does not need to follow-up as well, unless she desires referral to physical therapy or medication start (lyrica) etc through this office. - Follow-up PRN   Reviewed expectations re: course of current medical issues.  Discussed self-management of symptoms.  Outlined signs and symptoms indicating need for more acute intervention.  Patient verbalized understanding and all questions were answered.  Patient received an After-Visit Summary.   > 15 minutes spent with patient, >50% of time spent face to face counseling and/or coordinating care.     No orders of the defined types were placed in this  encounter.    Note is dictated utilizing voice recognition software. Although note has been proof read prior to signing, occasional typographical errors still can be missed. If any questions arise, please do not hesitate to call for verification.   electronically signed by:  Howard Pouch, DO  [place]

## 2017-05-28 ENCOUNTER — Ambulatory Visit (INDEPENDENT_AMBULATORY_CARE_PROVIDER_SITE_OTHER): Payer: 59

## 2017-05-28 ENCOUNTER — Other Ambulatory Visit: Payer: Self-pay | Admitting: Neurology

## 2017-05-28 DIAGNOSIS — R202 Paresthesia of skin: Secondary | ICD-10-CM

## 2017-05-28 DIAGNOSIS — M545 Low back pain, unspecified: Secondary | ICD-10-CM

## 2017-05-28 DIAGNOSIS — R200 Anesthesia of skin: Secondary | ICD-10-CM | POA: Diagnosis not present

## 2017-05-29 ENCOUNTER — Other Ambulatory Visit: Payer: Self-pay | Admitting: *Deleted

## 2017-05-29 ENCOUNTER — Other Ambulatory Visit: Payer: Self-pay | Admitting: Neurology

## 2017-05-29 ENCOUNTER — Ambulatory Visit (INDEPENDENT_AMBULATORY_CARE_PROVIDER_SITE_OTHER): Payer: 59

## 2017-05-29 DIAGNOSIS — R200 Anesthesia of skin: Secondary | ICD-10-CM

## 2017-05-29 DIAGNOSIS — H469 Unspecified optic neuritis: Secondary | ICD-10-CM

## 2017-05-29 DIAGNOSIS — G373 Acute transverse myelitis in demyelinating disease of central nervous system: Secondary | ICD-10-CM

## 2017-05-29 DIAGNOSIS — R202 Paresthesia of skin: Secondary | ICD-10-CM

## 2017-05-29 MED ORDER — GADOPENTETATE DIMEGLUMINE 469.01 MG/ML IV SOLN: 20.0000 mL | Freq: Once | INTRAVENOUS | Status: DC | PRN

## 2017-05-29 NOTE — Progress Notes (Signed)
The MRI of the thoracic and lumbar spine was performed yesterday. She has a transverse myelitis in the thoracic spine that is likely acute. No higher if she has another small focus that could represent a more chronic smaller focus of demyelination.  I discussed with her that there is a link between transverse myelitis and multiple sclerosis and we need to do the following: 1.    Blood work to rule out alternative causes of transverse myelitis. 2.    MRI of the brain with and without contrast to determine if she has plaques that would confirm a diagnosis of multiple sclerosis. At present we will need to start a disease modifying therapy.

## 2017-05-29 NOTE — Addendum Note (Signed)
Addended by: Inis Sizer D on: 05/29/2017 04:59 PM   Modules accepted: Orders

## 2017-05-31 ENCOUNTER — Telehealth: Payer: Self-pay | Admitting: *Deleted

## 2017-05-31 LAB — SEDIMENTATION RATE: SED RATE: 4 mm/h (ref 0–32)

## 2017-05-31 LAB — PAN-ANCA: Myeloperoxidase Ab: <9 U/mL (ref 0.0–9.0)

## 2017-05-31 LAB — ANA W/REFLEX: Anti Nuclear Antibody(ANA): NEGATIVE

## 2017-05-31 NOTE — Telephone Encounter (Signed)
Spoke with Teresa Wong and explained labs done in our office are fine.  She verbalized understanding of same, sts. numbness in legs is improving since IV SM infusions.  She will call back for anything new or worse prior to next appt/fim

## 2017-05-31 NOTE — Telephone Encounter (Signed)
-----   Message from Britt Bottom, MD sent at 05/31/2017  9:02 AM EDT ----- Please let her know all of the lab work was fine. If she has any new or worsening neurologic symptoms she should give [place] a call.  Otherwise follow-up is scheduled

## 2017-06-18 ENCOUNTER — Telehealth: Payer: 59 | Admitting: Family

## 2017-06-18 DIAGNOSIS — H1010 Acute atopic conjunctivitis, unspecified eye: Secondary | ICD-10-CM | POA: Diagnosis not present

## 2017-06-18 MED ORDER — POLYMYXIN B-TRIMETHOPRIM 10000-0.1 UNIT/ML-% OP SOLN: 2.0000 [drp] | OPHTHALMIC | 0 refills | Status: DC

## 2017-06-18 NOTE — Addendum Note (Signed)
Addended by: Chevis Pretty on: 06/18/2017 06:02 PM   Modules accepted: Orders

## 2017-06-18 NOTE — Progress Notes (Signed)
We are sorry that you are not feeling well.  Here is how we plan to help!  Based on what you have shared with me it looks like you have conjunctivitis.  Conjunctivitis is a common inflammatory or infectious condition of the eye that is often referred to as "pink eye".  In most cases it is contagious (viral or bacterial). However, not all conjunctivitis requires antibiotics (ex. Allergic).  We have made appropriate suggestions for you based upon your presentation.  I recommend that you use OpconA, 1-2 drops every 4-6 hours (an over the counter allergy drop available at your local pharmacy).  Your pharmacist may have an alternative suggestion.  Pink eye can be highly contagious.  It is typically spread through direct contact with secretions, or contaminated objects or surfaces that one may have touched.  Strict handwashing is suggested with soap and water is urged.  If not available, use alcohol based had sanitizer.  Avoid unnecessary touching of the eye.  If you wear contact lenses, you will need to refrain from wearing them until you see no white discharge from the eye for at least 24 hours after being on medication.  You should see symptom improvement in 1-2 days after starting the medication regimen.  Call us if symptoms are not improved in 1-2 days.  Home Care:  Wash your hands often!  Do not wear your contacts until you complete your treatment plan.  Avoid sharing towels, bed linen, personal items with a person who has pink eye.  See attention for anyone in your home with similar symptoms.  Get Help Right Away If:  Your symptoms do not improve.  You develop blurred or loss of vision.  Your symptoms worsen (increased discharge, pain or redness)  Your e-visit answers were reviewed by a board certified advanced clinical practitioner to complete your personal care plan.  Depending on the condition, your plan could have included both over the counter or prescription medications.  If there  is a problem please reply  once you have received a response from your provider.  Your safety is important to [place].  If you have drug allergies check your prescription carefully.    You can use MyChart to ask questions about today's visit, request a non-urgent call back, or ask for a work or school excuse for 24 hours related to this e-Visit. If it has been greater than 24 hours you will need to follow up with your provider, or enter a new e-Visit to address those concerns.   You will get an e-mail in the next two days asking about your experience.  I hope that your e-visit has been valuable and will speed your recovery. Thank you for using e-visits.

## 2017-06-21 IMAGING — CR DG CHEST 2V
2 series · 2 of 2 positions shown · non-contrast
Comparison: None.

CLINICAL DATA: Cough for 3 days

EXAM:
CHEST  2 VIEW

[chest pa]
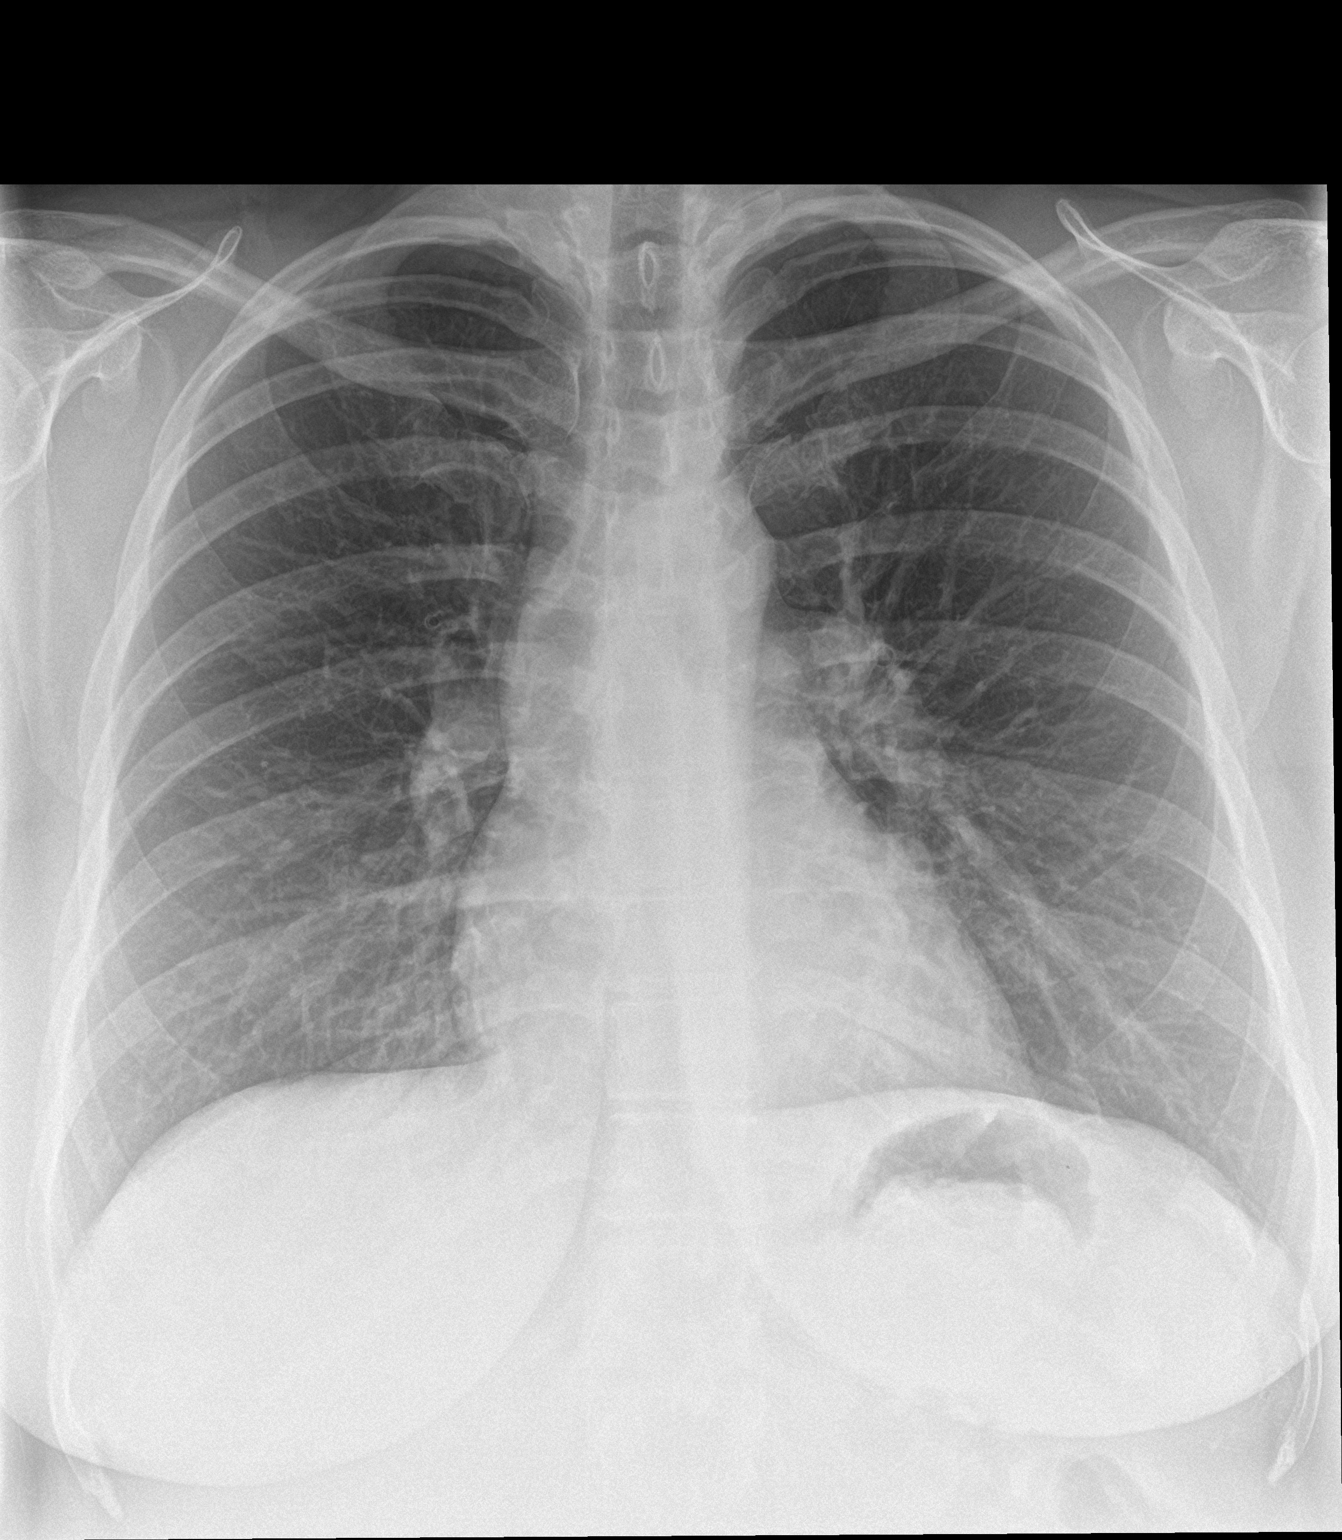

[chest lat]
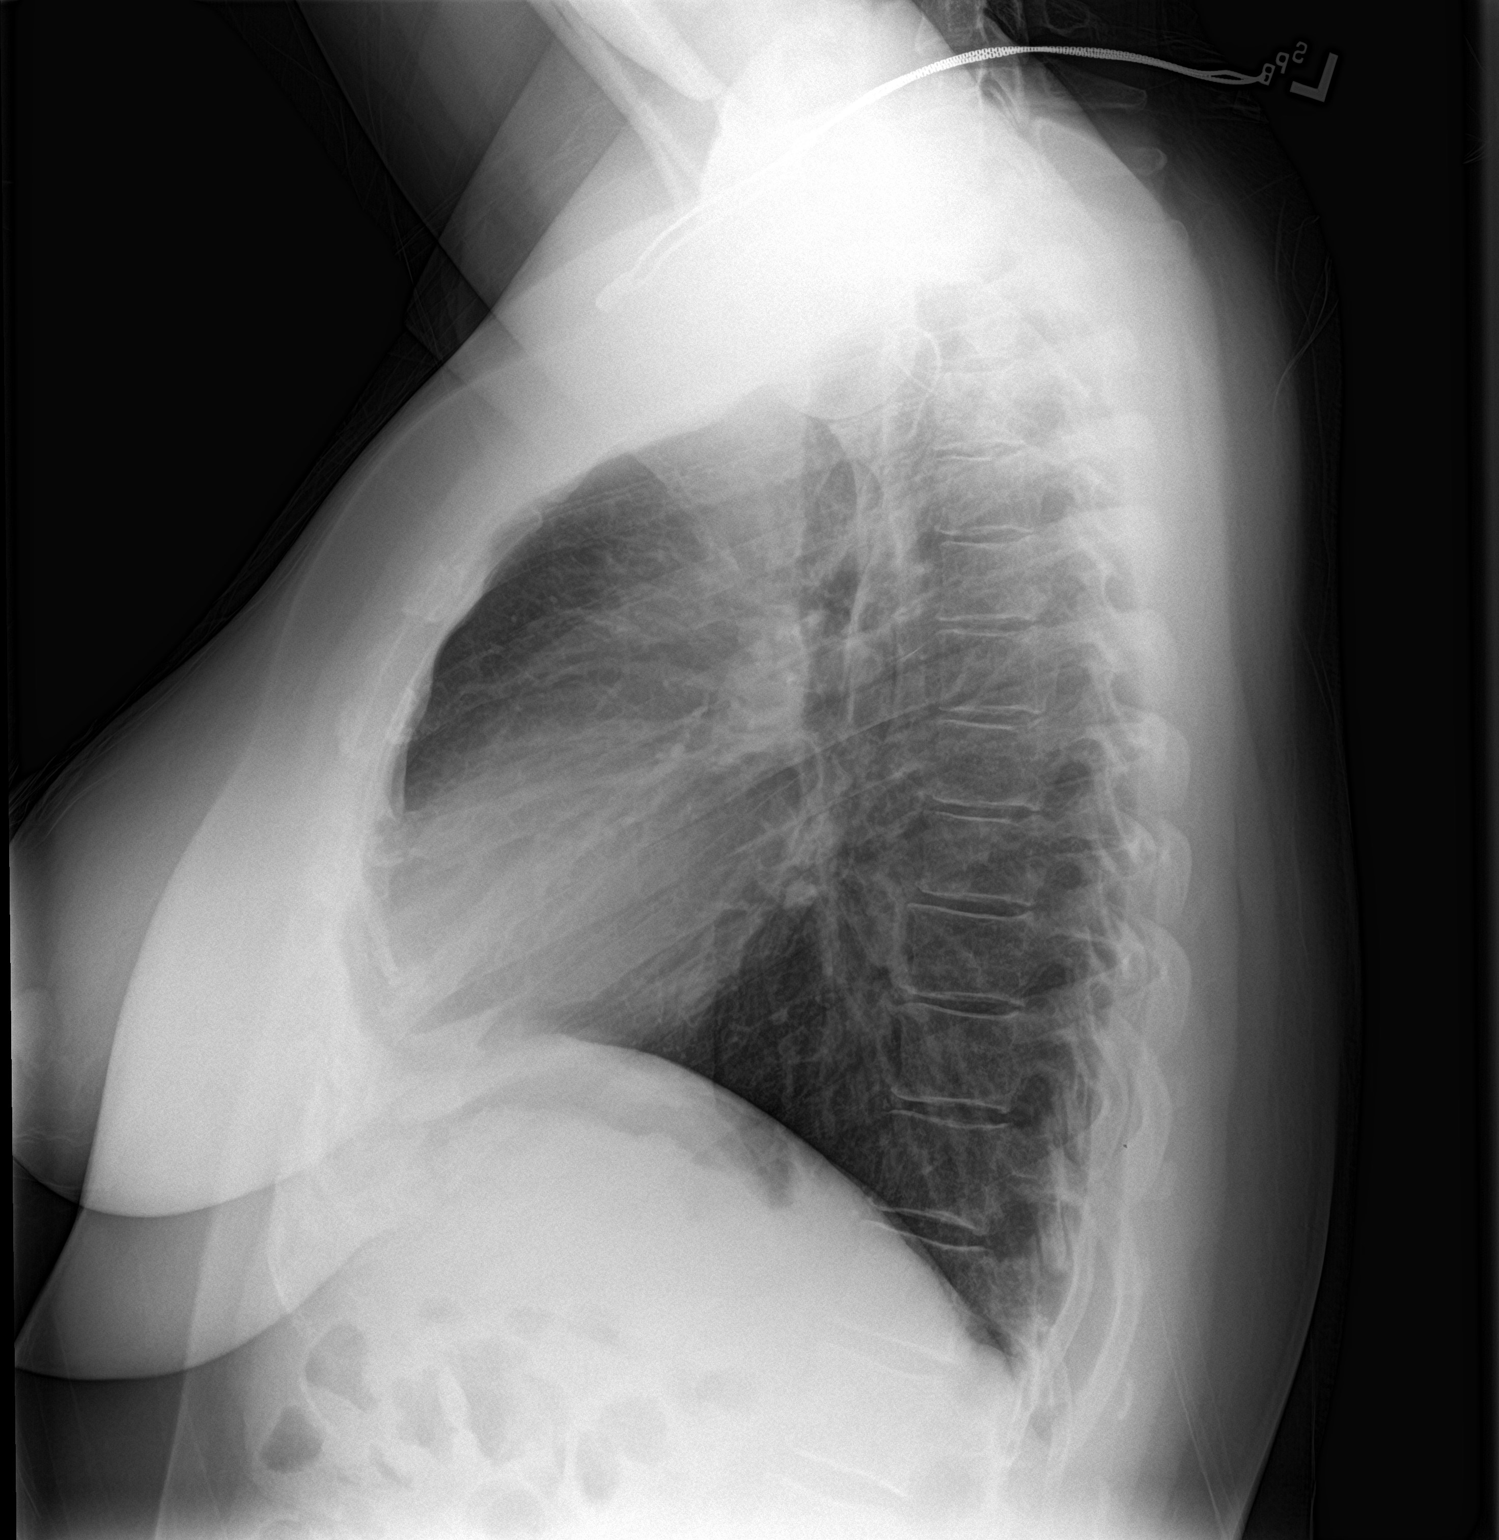

[2 of 2 positions shown; findings below may reference images not displayed]

FINDINGS: The heart size and mediastinal contours are within normal limits.
There is no focal infiltrate, pulmonary edema, or pleural effusion.
The visualized skeletal structures are unremarkable.
IMPRESSION: No active cardiopulmonary disease.

## 2017-07-02 ENCOUNTER — Encounter: Payer: 59 | Admitting: Neurology

## 2017-08-28 ENCOUNTER — Encounter: Payer: Self-pay | Admitting: Neurology

## 2017-08-28 ENCOUNTER — Ambulatory Visit: Payer: 59 | Admitting: Neurology

## 2017-08-28 VITALS — BP 141/89 | HR 89 | Ht 68.0 in | Wt 231.0 lb

## 2017-08-28 DIAGNOSIS — G373 Acute transverse myelitis in demyelinating disease of central nervous system: Secondary | ICD-10-CM | POA: Diagnosis not present

## 2017-08-28 DIAGNOSIS — R202 Paresthesia of skin: Secondary | ICD-10-CM | POA: Diagnosis not present

## 2017-08-28 DIAGNOSIS — R200 Anesthesia of skin: Secondary | ICD-10-CM

## 2017-08-28 DIAGNOSIS — M545 Low back pain, unspecified: Secondary | ICD-10-CM

## 2017-08-28 MED ORDER — VITAMIN D (ERGOCALCIFEROL) 1.25 MG (50000 UNIT) PO CAPS: 50000.0000 [IU] | ORAL_CAPSULE | ORAL | 1 refills | Status: DC

## 2017-08-28 MED FILL — VIT D2 1.25 MG (50,000 UNIT: 1.25 MG | 84 days supply | Qty: 12 | Fill #0

## 2017-08-28 NOTE — Progress Notes (Signed)
[facility]  PATIENT: SHADIE SWEATMAN DOB: 1975/10/08  REFERRING DOCTOR OR PCP:  Howard Pouch SOURCE: patient, notes from Dr. Raoul Pitch.  _________________________________   HISTORICAL  CHIEF COMPLAINT:  Chief Complaint  Patient presents with  . Numbness    Patient states that she has not had a single episode since her last visit.     HISTORY OF PRESENT ILLNESS:  Darleny Sem an episode of numbness in October 2018.  Update 08/28/2017: She has had 2 episodes of severe numbness, one about 40 years ago and one about 40 months ago.    MRI of the thoracic spine 05/28/2017 showed a focus at T10 that mildly expanded the spinal cord and would be consistent with an acute transverse myelitis. She also had a smaller anterior spinal cord focus to the right at T8.   MRI of the brain showed a single nonspecific focus in the genu.     She received 2 days of IV Solu-Medrol and her symptoms improved. She has not had any numbness returning since that time. We had a long discussion today about the significance of the finding and I showed her the MRI's. She appears to have had 2 episodes of transverse myelitis. The focus in the brain is nonspecific. I can't rule out that this could be an early presentation of MS. I think the likelihood that the thoracic lesion represents a non-inflammatory transverse myelitis is very small but we discussed the necessity to repeat an MRI of the thoracic spine to make sure that this is not a tumor. We also discussed that the brain MRI will have to be repeated sometime later this year to make sure that she is not experiencing more lesions. If there has been progression, the likelihood that this could represent MS is higher and I would consider further evaluation and treatment.  From 05/23/2017: About a week ago, after sleeping on a sofa with her 40 yo daughter, she woke up with discomfort in the lower back and numbness from the buttocks down.   She thinks she  was sleeping on her left side but a little bit twisted. Numbness is partial and she can feel heat/cold and sharp pain.   She has had a tingling with pins and needle sensation that has not resolved.    She denies any difficulty with her gait. She can stand with her eyes closed. There is no clumsiness or weakness in the legs. She denies any difficulty with her bladder. She does not note any symptoms in the arms or face. There is no change in vision.  She saw Dr. Raoul Pitch and was placed on steroids as a slow taper from 60 mg down. Over the last couple days she feels that there is some improvement of the numbness in the buttocks but she continues to have the tingling lower down in the leg.  Currently the symptoms are from the upper thighs down.  She has not had another episode of numbness like this in the past. However, she has had intermittent lower back discomfort since 40 years ago. Shortly after her second child was born she lifted him up and had the onset of pain in the lower back. Since then, she would get episodes lasting a few days to a week or so of further discomfort that would usually resolve with time and over the counter medications.  She denied recent infection but her daughter had a mild GI viral syndrome 1-2 days before her symptoms.    She has  no recent problem with headaches.          REVIEW OF SYSTEMS: Constitutional: No fevers, chills, sweats, or change in appetite.   She has gained some weight over the last year. Eyes: No visual changes, double vision, eye pain Ear, nose and throat: No hearing loss, ear pain, nasal congestion, sore throat Cardiovascular: No chest pain, palpitations Respiratory: No shortness of breath at rest or with exertion.   No wheezes GastrointestinaI: No nausea, vomiting, diarrhea, abdominal pain, fecal incontinence Genitourinary: No dysuria, urinary retention or frequency.  No nocturia. Musculoskeletal: No neck pain, back pain Integumentary: No rash,  pruritus, skin lesions Neurological: as above Psychiatric: No depression at this time.  No anxiety Endocrine: No palpitations, diaphoresis, change in appetite or increased thirst Hematologic/Lymphatic: No anemia, purpura, petechiae. Allergic/Immunologic: No itchy/runny eyes, nasal congestion, recent allergic reactions, rashes  ALLERGIES: No Known Allergies  HOME MEDICATIONS:  Current Outpatient Medications:  .  cholecalciferol (VITAMIN D) 400 units TABS tablet, Take 800 Units by mouth daily., Disp: , Rfl:  .  Multiple Vitamin (MULTIVITAMIN) tablet, Take 1 tablet by mouth daily., Disp: , Rfl:  .  Vitamin D, Ergocalciferol, (DRISDOL) 50000 units CAPS capsule, Take 1 capsule (50,000 Units total) by mouth every 7 (seven) days., Disp: 12 capsule, Rfl: 1 No current facility-administered medications for this visit.   Facility-Administered Medications Ordered in Other Visits:  .  gadopentetate dimeglumine (MAGNEVIST) injection 20 mL, 20 mL, Intravenous, Once PRN, Sater, Nanine Means, MD  PAST MEDICAL HISTORY: Past Medical History:  Diagnosis Date  . Abnormal Pap smear   . 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  . 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 History of LEEP (loop electrosurgical excision procedure) of cervix complicating pregnancy 1610  . Meningitis 1984  . Postpartum depression   . Pregnancy induced hypertension 2010, 2012   History - resolved - no problems with current pregnancy 2015    PAST SURGICAL HISTORY: Past Surgical History:  Procedure Laterality Date  . CERVICAL BIOPSY  W/ LOOP ELECTRODE EXCISION  2006  . CESAREAN SECTION N/A 11/26/2013   Procedure: CESAREAN SECTION;  Surgeon: Daria Pastures, MD;  Location: [facility];  Service: Obstetrics;  Laterality: N/A;  . COLPOSCOPY  09/2004  . DILATION AND CURETTAGE OF UTERUS  11/2011,08/2010,12/2007,08/2007   x4  . DILATION AND EVACUATION  12/18/2011   Procedure: DILATATION AND EVACUATION;   Surgeon: Daria Pastures, MD;  Location: [facility];  Service: Gynecology;  Laterality: N/A;  . TUBAL LIGATION Bilateral 11/26/2013   Procedure: BILATERAL TUBAL LIGATION;  Surgeon: Daria Pastures, MD;  Location: [facility];  Service: Obstetrics;  Laterality: Bilateral;  . WISDOM TOOTH EXTRACTION      FAMILY HISTORY: Family History  Problem Relation Age of Onset  . Hypertension Mother   . Hyperlipidemia Mother   . CAD Father 47       Bypass surgery 1.5 yrs ago   . Hyperlipidemia Father   . Cancer Father        bladder  . Bladder Cancer Father   . Diverticulitis Father   . Heart disease Maternal Grandfather   . Colon cancer Maternal Grandmother   . Cancer Maternal Grandmother        colon  . Miscarriages / Stillbirths Paternal Grandmother     SOCIAL HISTORY:  Social History   Socioeconomic History  . Marital status: Married    Spouse name: Not on file  .  Number of children: 2  . Years of education: some colle  . Highest education level: Not on file  Social Needs  . Financial resource strain: Not on file  . Food insecurity - worry: Not on file  . Food insecurity - inability: Not on file  . Transportation needs - medical: Not on file  . Transportation needs - non-medical: Not on file  Occupational History  . Not on file  Tobacco Use  . Smoking status: Never Smoker  . Smokeless tobacco: Never Used  Substance and Sexual Activity  . Alcohol use: No  . Drug use: No  . Sexual activity: Yes    Birth control/protection: Surgical    Comment: pregnant   Other Topics Concern  . Not on file  Social History Narrative   [name] lives with her husband & 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     PHYSICAL EXAM  Vitals:   08/28/17 1355  BP: (!) 141/89  Pulse: 89  Weight: 231 lb (104.8 kg)  Height: 5' 8"  (1.727 m)    Body mass index is 35.12 kg/m.   General: The patient is well-developed and well-nourished and in no acute distress   Neurologic Exam  Mental  status: The patient is alert and oriented x 3 at the time of the examination. The patient has apparent normal recent and remote memory, with an apparently normal attention span and concentration ability.   Speech is normal.  Cranial nerves: Extraocular movements are full. Facial strength and sensation is normal. Trapezius strength is strong.. No dysarthria is noted.  The tongue is midline, and the patient has symmetric elevation of the soft palate. Mild reduced right hearing..  Motor:  Muscle bulk is normal.   Tone is normal. Strength is  5 / 5 in all 4 extremities.   Sensory: currently, she has intact sensation to touch and temperature in the limbs and the back, bilaterally. She has intact sensation to vibration in the limbs..  Coordination: Cerebellar testing reveals good finger-nose-finger and heel-to-shin bilaterally.  Gait and station: Station is normal.   Gait is normal. Tandem gait is normal. Romberg is negative.   Reflexes: Deep tendon reflexes are symmetric and normal in 3) in the arms. Deep tendon reflexes are 2+ at the knees but there is now no spread and no ankle clonus.    DIAGNOSTIC DATA (LABS, IMAGING, TESTING) - I reviewed patient records, labs, notes, testing and imaging myself where available.  Lab Results  Component Value Date   WBC 7.4 05/20/2017   HGB 13.0 05/20/2017   HCT 37.5 05/20/2017   MCV 85.0 05/20/2017   PLT 370 05/20/2017      Component Value Date/Time   NA 139 05/20/2017 1504   K 4.3 05/20/2017 1504   CL 102 05/20/2017 1504   CO2 23 05/20/2017 1504   GLUCOSE 101 (H) 05/20/2017 1504   BUN 7 05/20/2017 1504   CREATININE 0.53 05/20/2017 1504   CALCIUM 9.3 05/20/2017 1504   PROT 6.8 11/26/2014 0904   ALBUMIN 4.3 11/26/2014 0904   AST 12 11/26/2014 0904   ALT 12 11/26/2014 0904   ALKPHOS 67 11/26/2014 0904   BILITOT 0.5 11/26/2014 0904   GFRNONAA >60 04/25/2011 1151   GFRAA >60 04/25/2011 1151   Lab Results  Component Value Date   CHOL 185  11/26/2014   HDL 46.40 11/26/2014   LDLCALC 127 (H) 11/26/2014   TRIG 57.0 11/26/2014   CHOLHDL 4 11/26/2014   Lab Results  Component  Value Date   HGBA1C 5.1 05/20/2017   Lab Results  Component Value Date   FYTWKMQK86 381 05/20/2017   Lab Results  Component Value Date   TSH 0.88 05/20/2017       ASSESSMENT AND PLAN  Transverse myelitis ([place]) - Plan: MR BRAIN W WO CONTRAST, MR THORACIC SPINE W WO CONTRAST  Numbness and tingling of both legs - Plan: MR BRAIN W WO CONTRAST, MR THORACIC SPINE W WO CONTRAST  Acute midline low back pain without sciatica  1. The transverse myelitis around T10 most likely is post bilateral or idiopathic or related to MS. However, due to the expansile nature of the lesion on the MRI, we need to follow this up with another MRI of the thoracic spine (we'll do this one with and without) to make sure that the lesion is not expanding further. If so, a noninflammatory etiology such as tumor would be possible. 2.   We discussed that transverse myelitis increases the likelihood of developing MS. She also has one spot in the splenium of the corpus callosum. Though nonspecific, this could be demyelinating and we need to follow up the MRI of the brain in 4 months with another study to determine if there has been subclinical progression. If present, the likelihood that this represents multiple sclerosis is much higher and we would need to consider beginning a disease modifying therapy. 3.    She will return to see me in about 4 months (a couple weeks after the brain MRI) but call sooner if new or worsening neurologic symptoms.  Richard A. Felecia Shelling, MD, [facility] 7/71/1657, 9:03 PM Certified in Neurology, Clinical Neurophysiology, Sleep Medicine, Pain Medicine and Neuroimaging  [facility] [address], [place], [place] [address] [phone]

## 2017-09-11 ENCOUNTER — Ambulatory Visit: Payer: 59

## 2017-09-11 DIAGNOSIS — R200 Anesthesia of skin: Secondary | ICD-10-CM | POA: Diagnosis not present

## 2017-09-11 DIAGNOSIS — G373 Acute transverse myelitis in demyelinating disease of central nervous system: Secondary | ICD-10-CM

## 2017-09-11 DIAGNOSIS — R202 Paresthesia of skin: Secondary | ICD-10-CM

## 2017-09-11 MED ORDER — GADOPENTETATE DIMEGLUMINE 469.01 MG/ML IV SOLN: 20.0000 mL | Freq: Once | INTRAVENOUS | Status: DC | PRN

## 2017-09-12 ENCOUNTER — Telehealth: Payer: Self-pay | Admitting: *Deleted

## 2017-09-12 NOTE — Telephone Encounter (Signed)
Spoke with Kristiane and reviewed below MRI report.  She verbalized understanding of same/fim

## 2017-09-12 NOTE — Telephone Encounter (Signed)
-----   Message from Britt Bottom, MD sent at 09/12/2017  9:52 AM EST ----- Please let her know that the MRI does not show any new lesions and the old one looks better.

## 2017-09-27 ENCOUNTER — Other Ambulatory Visit: Payer: Self-pay

## 2017-09-27 ENCOUNTER — Inpatient Hospital Stay (HOSPITAL_BASED_OUTPATIENT_CLINIC_OR_DEPARTMENT_OTHER)
Admission: EM | Admit: 2017-09-27 | Discharge: 2017-09-28 | DRG: 419 | Disposition: A | Discharge status: Home / Self Care | Payer: 59 | Attending: Surgery | Admitting: Surgery

## 2017-09-27 ENCOUNTER — Emergency Department (HOSPITAL_BASED_OUTPATIENT_CLINIC_OR_DEPARTMENT_OTHER): Payer: 59

## 2017-09-27 ENCOUNTER — Encounter (HOSPITAL_BASED_OUTPATIENT_CLINIC_OR_DEPARTMENT_OTHER): Payer: Self-pay | Admitting: Emergency Medicine

## 2017-09-27 DIAGNOSIS — E669 Obesity, unspecified: Secondary | ICD-10-CM | POA: Diagnosis present

## 2017-09-27 DIAGNOSIS — R1013 Epigastric pain: Secondary | ICD-10-CM | POA: Diagnosis not present

## 2017-09-27 DIAGNOSIS — K7581 Nonalcoholic steatohepatitis (NASH): Secondary | ICD-10-CM | POA: Diagnosis not present

## 2017-09-27 DIAGNOSIS — I10 Essential (primary) hypertension: Secondary | ICD-10-CM | POA: Diagnosis not present

## 2017-09-27 DIAGNOSIS — K8012 Calculus of gallbladder with acute and chronic cholecystitis without obstruction: Secondary | ICD-10-CM | POA: Diagnosis not present

## 2017-09-27 DIAGNOSIS — R1011 Right upper quadrant pain: Secondary | ICD-10-CM | POA: Diagnosis not present

## 2017-09-27 DIAGNOSIS — K812 Acute cholecystitis with chronic cholecystitis: Secondary | ICD-10-CM | POA: Insufficient documentation

## 2017-09-27 DIAGNOSIS — E785 Hyperlipidemia, unspecified: Secondary | ICD-10-CM | POA: Diagnosis present

## 2017-09-27 DIAGNOSIS — R109 Unspecified abdominal pain: Secondary | ICD-10-CM

## 2017-09-27 DIAGNOSIS — E7800 Pure hypercholesterolemia, unspecified: Secondary | ICD-10-CM

## 2017-09-27 DIAGNOSIS — K810 Acute cholecystitis: Secondary | ICD-10-CM | POA: Diagnosis not present

## 2017-09-27 DIAGNOSIS — R079 Chest pain, unspecified: Secondary | ICD-10-CM | POA: Diagnosis not present

## 2017-09-27 DIAGNOSIS — R0789 Other chest pain: Secondary | ICD-10-CM | POA: Diagnosis not present

## 2017-09-27 DIAGNOSIS — K8000 Calculus of gallbladder with acute cholecystitis without obstruction: Secondary | ICD-10-CM

## 2017-09-27 DIAGNOSIS — K828 Other specified diseases of gallbladder: Secondary | ICD-10-CM | POA: Diagnosis present

## 2017-09-27 DIAGNOSIS — E876 Hypokalemia: Secondary | ICD-10-CM

## 2017-09-27 DIAGNOSIS — K8020 Calculus of gallbladder without cholecystitis without obstruction: Secondary | ICD-10-CM | POA: Diagnosis not present

## 2017-09-27 DIAGNOSIS — Z6831 Body mass index (BMI) 31.0-31.9, adult: Secondary | ICD-10-CM | POA: Diagnosis not present

## 2017-09-27 DIAGNOSIS — K819 Cholecystitis, unspecified: Secondary | ICD-10-CM

## 2017-09-27 DIAGNOSIS — G373 Acute transverse myelitis in demyelinating disease of central nervous system: Secondary | ICD-10-CM | POA: Diagnosis present

## 2017-09-27 LAB — COMPREHENSIVE METABOLIC PANEL
ALK PHOS: 72 U/L (ref 38–126)
ALT: 16 U/L (ref 14–54)
ANION GAP: 11 (ref 5–15)
AST: 19 U/L (ref 15–41)
Albumin: 4.6 g/dL (ref 3.5–5.0)
BUN: 9 mg/dL (ref 6–20)
CALCIUM: 9.2 mg/dL (ref 8.9–10.3)
CHLORIDE: 103 mmol/L (ref 101–111)
CO2: 23 mmol/L (ref 22–32)
Creatinine, Ser: 0.53 mg/dL (ref 0.44–1.00)
GFR calc non Af Amer: >60 mL/min (ref >60)
Glucose, Bld: 118 mg/dL — ABNORMAL HIGH (ref 65–99)
Potassium: 3.1 mmol/L — ABNORMAL LOW (ref 3.5–5.1)
SODIUM: 137 mmol/L (ref 135–145)
Total Bilirubin: 0.1 mg/dL — ABNORMAL LOW (ref 0.3–1.2)
Total Protein: 7.9 g/dL (ref 6.5–8.1)

## 2017-09-27 LAB — CBC
HCT: 37 % (ref 36.0–46.0)
Hemoglobin: 12.9 g/dL (ref 12.0–15.0)
MCH: 29.2 pg (ref 26.0–34.0)
MCHC: 34.9 g/dL (ref 30.0–36.0)
MCV: 83.7 fL (ref 78.0–100.0)
PLATELETS: 394 10*3/uL (ref 150–400)
RBC: 4.42 MIL/uL (ref 3.87–5.11)
RDW: 13.2 % (ref 11.5–15.5)
WBC: 11.3 10*3/uL — AB (ref 4.0–10.5)

## 2017-09-27 LAB — PREGNANCY, URINE: PREG TEST UR: NEGATIVE

## 2017-09-27 LAB — TROPONIN I: Troponin I: <0.03 ng/mL (ref <0.03)

## 2017-09-27 LAB — LIPASE, BLOOD: Lipase: 28 U/L (ref 11–51)

## 2017-09-27 MED ORDER — PANTOPRAZOLE SODIUM 40 MG IV SOLR
40.0000 mg | Freq: Once | INTRAVENOUS | Status: AC
Start: 2017-09-27 — End: 2017-09-27
  Administered 2017-09-27: 40 mg via INTRAVENOUS
  Filled 2017-09-27: qty 40

## 2017-09-27 MED ORDER — ONDANSETRON HCL 4 MG/2ML IJ SOLN
4.0000 mg | Freq: Once | INTRAMUSCULAR | Status: AC
Start: 2017-09-27 — End: 2017-09-27
  Administered 2017-09-27: 4 mg via INTRAVENOUS
  Filled 2017-09-27: qty 2

## 2017-09-27 NOTE — ED Notes (Signed)
Family at bedside. 

## 2017-09-27 NOTE — ED Triage Notes (Signed)
Pt presents with c/o right chest and under right shoulder blade pain that started today with nausea and vomiting

## 2017-09-27 NOTE — ED Notes (Signed)
ED Provider at bedside. 

## 2017-09-27 NOTE — ED Notes (Signed)
Pt. Said she ate dinner spaghetti for supper and then felt nauseated and vomited

## 2017-09-27 NOTE — ED Provider Notes (Signed)
[facility]ERGENCY DEPARTMENT Provider Note   CSN: [id number] Arrival date & time: 09/27/17  2145     History   Chief Complaint Chief Complaint  Patient presents with  . Chest Pain    HPI Teresa Wong is a 42 y.o. female.  The history is provided by the patient. No language interpreter was used.  Chest Pain      Teresa Wong is a 42 y.o. female who presents to the Emergency Department complaining of abdominal pain/chest pain.  Presents for evaluation of lower central chest pain/epigastric pain that began around 2 PM.  She states it initially felt like indigestion.  Symptoms are constant nature and progressed throughout the day.  The pain then radiated around her right upper quadrant.  She feels mild associated shortness of breath.  Around dinnertime she developed associated nausea with emesis x2.  No reports of fevers but she has had chills.  She did have a loose stool.  No dysuria, leg swelling or pain.  No prior similar symptoms.  No sick contacts or bad food exposures.  She has a history of C-section, no additional abdominal surgeries.  She takes vitamins, no additional medications.  No history of blood clots.  Symptoms are moderate, constant, worsening.  Last meal at 6 PM.  Past Medical History:  Diagnosis Date  . Abnormal Pap smear   . 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  . 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 History of LEEP (loop electrosurgical excision procedure) of cervix complicating pregnancy 5885  . Meningitis 1984  . Postpartum depression   . Pregnancy induced hypertension 2010, 2012   History - resolved - no problems with current pregnancy 2015    Patient Active Problem List   Diagnosis Date Noted  . Acute calculous cholecystitis 09/28/2017  . Transverse myelitis ([place]) 05/29/2017  . Lower back pain 05/23/2017  . Numbness and tingling of both legs 05/21/2017  . Vitamin D deficiency 01/12/2015  . Elevated  LDL cholesterol level 01/12/2015  . Vitamin B12 deficiency (non anemic) 01/12/2015  . BMI 32.0-32.9,adult 11/29/2014  . History of recurrent miscarriages, not currently pregnant 10/02/2011    Past Surgical History:  Procedure Laterality Date  . CERVICAL BIOPSY  W/ LOOP ELECTRODE EXCISION  2006  . CESAREAN SECTION N/A 11/26/2013   Procedure: CESAREAN SECTION;  Surgeon: Daria Pastures, MD;  Location: [facility];  Service: Obstetrics;  Laterality: N/A;  . COLPOSCOPY  09/2004  . DILATION AND CURETTAGE OF UTERUS  11/2011,08/2010,12/2007,08/2007   x4  . DILATION AND EVACUATION  12/18/2011   Procedure: DILATATION AND EVACUATION;  Surgeon: Daria Pastures, MD;  Location: [facility];  Service: Gynecology;  Laterality: N/A;  . TUBAL LIGATION Bilateral 11/26/2013   Procedure: BILATERAL TUBAL LIGATION;  Surgeon: Daria Pastures, MD;  Location: [facility];  Service: Obstetrics;  Laterality: Bilateral;  . WISDOM TOOTH EXTRACTION      OB History    Gravida Para Term Preterm AB Living   7 3 2 1 4 2    SAB TAB Ectopic Multiple Live Births   4 0 0 0 2       Home Medications    Prior to Admission medications   Medication Sig Start Date End Date Taking? Authorizing Provider  cholecalciferol (VITAMIN D) 400 units TABS tablet Take 800 Units by mouth daily.    [provider]  Multiple Vitamin (MULTIVITAMIN) tablet Take 1  tablet by mouth daily.    [provider]  Vitamin D, Ergocalciferol, (DRISDOL) 50000 units CAPS capsule Take 1 capsule (50,000 Units total) by mouth every 7 (seven) days. 08/28/17   Sater, Nanine Means, MD    Family History Family History  Problem Relation Age of Onset  . Hypertension Mother   . Hyperlipidemia Mother   . CAD Father 19       Bypass surgery 1.5 yrs ago   . Hyperlipidemia Father   . Cancer Father        bladder  . Bladder Cancer Father   . Diverticulitis Father   . Heart disease Maternal Grandfather   . Colon cancer Maternal Grandmother   . Cancer  Maternal Grandmother        colon  . Miscarriages / Stillbirths Paternal Grandmother     Social History Social History   Tobacco Use  . Smoking status: Never Smoker  . Smokeless tobacco: Never Used  Substance Use Topics  . Alcohol use: No  . Drug use: No     Allergies   Patient has no known allergies.   Review of Systems Review of Systems  Cardiovascular: Positive for chest pain.  All other systems reviewed and are negative.    Physical Exam Updated Vital Signs BP 125/77   Pulse 88   Temp 98.5 F (36.9 C) (Oral)   Resp (!) 22   LMP 09/01/2017   SpO2 99%   Physical Exam  Constitutional: She is oriented to person, place, and time. She appears well-developed and well-nourished.  HENT:  Head: Normocephalic and atraumatic.  Cardiovascular: Normal rate and regular rhythm.  No murmur heard. Pulmonary/Chest: Effort normal and breath sounds normal. No respiratory distress.  Abdominal: Soft. There is no rebound and no guarding.  Mild epigastric and right upper quadrant tenderness, positive Murphy's.  Musculoskeletal: She exhibits no edema or tenderness.  Neurological: She is alert and oriented to person, place, and time.  Skin: Skin is warm and dry.  Psychiatric: She has a normal mood and affect. Her behavior is normal.  Nursing note and vitals reviewed.    ED Treatments / Results  Labs (all labs ordered are listed, but only abnormal results are displayed) Labs Reviewed  CBC - Abnormal; Notable for the following components:      Result Value   WBC 11.3 (*)    All other components within normal limits  COMPREHENSIVE METABOLIC PANEL - Abnormal; Notable for the following components:   Potassium 3.1 (*)    Glucose, Bld 118 (*)    Total Bilirubin 0.1 (*)    All other components within normal limits  TROPONIN I  PREGNANCY, URINE  LIPASE, BLOOD    EKG  EKG Interpretation  Date/Time:  Friday September 27 2017 21:50:01 EST Ventricular Rate:  95 PR  Interval:  132 QRS Duration: 78 QT Interval:  342 QTC Calculation: 429 R Axis:   43 Text Interpretation:  Normal sinus rhythm Nonspecific ST abnormality Abnormal ECG no prior available for comparison Confirmed by Quintella Reichert [phone]) on 09/27/2017 9:59:29 PM       Radiology Dg Chest 2 View  Result Date: 09/27/2017 CLINICAL DATA:  Acute onset chest pain under the right breast EXAM: CHEST  2 VIEW COMPARISON:  05/30/2015 FINDINGS: The heart size and mediastinal contours are within normal limits. Both lungs are clear. The visualized skeletal structures are unremarkable. IMPRESSION: No active cardiopulmonary disease. Electronically Signed   By: Donavan Foil M.D.   On: 09/27/2017 22:25  US Abdomen Limited Ruq  Result Date: 09/27/2017 CLINICAL DATA:  Severe postprandial epigastric pain radiating into the right upper quadrant with nausea and vomiting today. EXAM: ULTRASOUND ABDOMEN LIMITED RIGHT UPPER QUADRANT COMPARISON:  None. FINDINGS: Gallbladder: A stone impacted in the gallbladder neck measures 2.8 cm in diameter. There is some sludge within the gallbladder. Scattered foci of ring down artifact are also identified off the gallbladder wall consistent with adenomyomatosis. Sonographer reports positive Murphy's sign. Gallbladder wall thickness is normal at 0.3 cm. No pericholecystic fluid. Common bile duct: Diameter: 0.4 cm Liver: No focal lesion identified. Within normal limits in parenchymal echogenicity. Portal vein is patent on color Doppler imaging with normal direction of blood flow towards the liver. IMPRESSION: 2.8 cm stone impacted in the gallbladder neck and gallbladder sludge. Changes of adenomyomatosis are identified. Sonographer reports positive Murphy's sign. No gallbladder wall thickening or pericholecystic fluid. Electronically Signed   By: Inge Rise M.D.   On: 09/27/2017 23:34    Procedures Procedures (including critical care time)  Medications Ordered in ED Medications   potassium chloride 10 mEq in 100 mL IVPB (not administered)  sodium chloride 0.9 % bolus 1,000 mL (not administered)  cefTRIAXone (ROCEPHIN) 2 g in sodium chloride 0.9 % 100 mL IVPB (not administered)  pantoprazole (PROTONIX) injection 40 mg (40 mg Intravenous Given 09/27/17 2255)  ondansetron (ZOFRAN) injection 4 mg (4 mg Intravenous Given 09/27/17 2252)     Initial Impression / Assessment and Plan / ED Course  I have reviewed the triage vital signs and the nursing notes.  Pertinent labs & imaging results that were available during my care of the patient were reviewed by me and considered in my medical decision making (see chart for details).     Patient here for evaluation of epigastric and right upper quadrant pain that started around 2 PM.  She does have focal tenderness on examination but no peritoneal findings.  CBC demonstrates mild leukocytosis.  Right upper quadrant ultrasound does demonstrate obstructing stone in the gallbladder neck.  Discussed with patient concerns for acute cholecystitis.  Treating with IV fluids, antibiotics.  Discussed with Dr. Johney Maine with general surgery at [facility].  He recommends ED to ED transfer so he can evaluate the patient.  Discussed with Dr. Tomi Bamberger in the [facility] emergency department who accepts the patient in transfer.  Patient updated of findings studies and recommendation for admission for further treatment and she is in agreement with the plan.  Presentation is not consistent with ACS, PE, pna.    Final Clinical Impressions(s) / ED Diagnoses   Final diagnoses:  Abdominal pain  Cholecystitis, acute    ED Discharge Orders    None       Quintella Reichert, MD 09/28/17 0040

## 2017-09-28 ENCOUNTER — Inpatient Hospital Stay (HOSPITAL_COMMUNITY): Payer: 59 | Admitting: Certified Registered Nurse Anesthetist

## 2017-09-28 ENCOUNTER — Encounter (HOSPITAL_COMMUNITY): Payer: Self-pay

## 2017-09-28 ENCOUNTER — Encounter (HOSPITAL_COMMUNITY): Admission: EM | Disposition: A | Discharge status: Home / Self Care | Payer: Self-pay | Source: Home / Self Care

## 2017-09-28 ENCOUNTER — Inpatient Hospital Stay (HOSPITAL_COMMUNITY): Payer: 59

## 2017-09-28 DIAGNOSIS — I10 Essential (primary) hypertension: Secondary | ICD-10-CM | POA: Diagnosis not present

## 2017-09-28 DIAGNOSIS — G373 Acute transverse myelitis in demyelinating disease of central nervous system: Secondary | ICD-10-CM | POA: Diagnosis not present

## 2017-09-28 DIAGNOSIS — K8010 Calculus of gallbladder with chronic cholecystitis without obstruction: Secondary | ICD-10-CM | POA: Diagnosis not present

## 2017-09-28 DIAGNOSIS — K812 Acute cholecystitis with chronic cholecystitis: Secondary | ICD-10-CM | POA: Insufficient documentation

## 2017-09-28 DIAGNOSIS — K8012 Calculus of gallbladder with acute and chronic cholecystitis without obstruction: Secondary | ICD-10-CM | POA: Diagnosis not present

## 2017-09-28 DIAGNOSIS — E876 Hypokalemia: Secondary | ICD-10-CM

## 2017-09-28 DIAGNOSIS — E7800 Pure hypercholesterolemia, unspecified: Secondary | ICD-10-CM | POA: Diagnosis present

## 2017-09-28 DIAGNOSIS — E669 Obesity, unspecified: Secondary | ICD-10-CM | POA: Diagnosis present

## 2017-09-28 DIAGNOSIS — Z6831 Body mass index (BMI) 31.0-31.9, adult: Secondary | ICD-10-CM | POA: Diagnosis not present

## 2017-09-28 DIAGNOSIS — K828 Other specified diseases of gallbladder: Secondary | ICD-10-CM | POA: Diagnosis present

## 2017-09-28 DIAGNOSIS — K839 Disease of biliary tract, unspecified: Secondary | ICD-10-CM | POA: Diagnosis not present

## 2017-09-28 DIAGNOSIS — K7581 Nonalcoholic steatohepatitis (NASH): Secondary | ICD-10-CM | POA: Diagnosis not present

## 2017-09-28 HISTORY — DX: Acute cholecystitis with chronic cholecystitis: K81.2

## 2017-09-28 HISTORY — PX: LAPAROSCOPIC CHOLECYSTECTOMY SINGLE PORT: SHX5891

## 2017-09-28 LAB — MAGNESIUM: Magnesium: 1.8 mg/dL (ref 1.7–2.4)

## 2017-09-28 LAB — SURGICAL PCR SCREEN
MRSA, PCR: NEGATIVE
STAPHYLOCOCCUS AUREUS: NEGATIVE

## 2017-09-28 SURGERY — LAPAROSCOPIC CHOLECYSTECTOMY SINGLE SITE
Anesthesia: General | Site: Abdomen

## 2017-09-28 MED ORDER — HYDROCORTISONE 2.5 % RE CREA
1.0000 "application " | TOPICAL_CREAM | Freq: Four times a day (QID) | RECTAL | Status: DC | PRN
  Filled 2017-09-28: qty 28.35

## 2017-09-28 MED ORDER — ONDANSETRON 4 MG PO TBDP: 4.0000 mg | ORAL_TABLET | Freq: Four times a day (QID) | ORAL | Status: DC | PRN

## 2017-09-28 MED ORDER — HYDROCORTISONE 1 % EX CREA
1.0000 "application " | TOPICAL_CREAM | Freq: Three times a day (TID) | CUTANEOUS | Status: DC | PRN
  Filled 2017-09-28: qty 28

## 2017-09-28 MED ORDER — SODIUM CHLORIDE 0.9 % IV SOLN
2.0000 g | INTRAVENOUS | Status: AC
  Administered 2017-09-28: 2 g via INTRAVENOUS

## 2017-09-28 MED ORDER — PROCHLORPERAZINE EDISYLATE 5 MG/ML IJ SOLN: 5.0000 mg | INTRAMUSCULAR | Status: DC | PRN

## 2017-09-28 MED ORDER — BUPIVACAINE-EPINEPHRINE 0.5% -1:200000 IJ SOLN
INTRAMUSCULAR | Status: AC
  Filled 2017-09-28: qty 1

## 2017-09-28 MED ORDER — FENTANYL CITRATE (PF) 100 MCG/2ML IJ SOLN
INTRAMUSCULAR | Status: DC | PRN
  Administered 2017-09-28 (×5): 50 ug via INTRAVENOUS

## 2017-09-28 MED ORDER — MUPIROCIN 2 % EX OINT
TOPICAL_OINTMENT | Freq: Once | CUTANEOUS | Status: AC
  Administered 2017-09-28: 11:00:00 via NASAL

## 2017-09-28 MED ORDER — MENTHOL 3 MG MT LOZG
1.0000 | LOZENGE | OROMUCOSAL | Status: DC | PRN
  Filled 2017-09-28: qty 9

## 2017-09-28 MED ORDER — POTASSIUM CHLORIDE 10 MEQ/100ML IV SOLN
10.0000 meq | INTRAVENOUS | Status: AC
  Administered 2017-09-28 (×2): 10 meq via INTRAVENOUS
  Filled 2017-09-28 (×2): qty 100

## 2017-09-28 MED ORDER — ACETAMINOPHEN 500 MG PO TABS
1000.0000 mg | ORAL_TABLET | ORAL | Status: AC
  Administered 2017-09-28: 1000 mg via ORAL
  Filled 2017-09-28: qty 2

## 2017-09-28 MED ORDER — MAGIC MOUTHWASH
15.0000 mL | Freq: Four times a day (QID) | ORAL | Status: DC | PRN
  Filled 2017-09-28: qty 15

## 2017-09-28 MED ORDER — FENTANYL CITRATE (PF) 100 MCG/2ML IJ SOLN
50.0000 ug | Freq: Once | INTRAMUSCULAR | Status: AC
  Administered 2017-09-28: 50 ug via INTRAVENOUS
  Filled 2017-09-28: qty 2

## 2017-09-28 MED ORDER — LACTATED RINGERS IV BOLUS (SEPSIS): 1000.0000 mL | Freq: Once | INTRAVENOUS | Status: DC

## 2017-09-28 MED ORDER — LIDOCAINE 2% (20 MG/ML) 5 ML SYRINGE
INTRAMUSCULAR | Status: AC
  Filled 2017-09-28: qty 5

## 2017-09-28 MED ORDER — DEXAMETHASONE SODIUM PHOSPHATE 10 MG/ML IJ SOLN
INTRAMUSCULAR | Status: DC | PRN
  Administered 2017-09-28: 10 mg via INTRAVENOUS

## 2017-09-28 MED ORDER — POTASSIUM CHLORIDE 10 MEQ/100ML IV SOLN
10.0000 meq | INTRAVENOUS | Status: AC
  Administered 2017-09-28: 10 meq via INTRAVENOUS

## 2017-09-28 MED ORDER — CELECOXIB 200 MG PO CAPS
200.0000 mg | ORAL_CAPSULE | ORAL | Status: AC
  Administered 2017-09-28: 200 mg via ORAL
  Filled 2017-09-28: qty 1

## 2017-09-28 MED ORDER — IOPAMIDOL (ISOVUE-300) INJECTION 61%
INTRAVENOUS | Status: DC | PRN
  Administered 2017-09-28: 17 mL

## 2017-09-28 MED ORDER — ALUM & MAG HYDROXIDE-SIMETH 200-200-20 MG/5ML PO SUSP: 30.0000 mL | Freq: Four times a day (QID) | ORAL | Status: DC | PRN

## 2017-09-28 MED ORDER — CHLORHEXIDINE GLUCONATE CLOTH 2 % EX PADS
6.0000 | MEDICATED_PAD | Freq: Once | CUTANEOUS | Status: AC
  Administered 2017-09-28: 6 via TOPICAL

## 2017-09-28 MED ORDER — IOPAMIDOL (ISOVUE-300) INJECTION 61%
INTRAVENOUS | Status: AC
  Filled 2017-09-28: qty 50

## 2017-09-28 MED ORDER — ONDANSETRON HCL 4 MG/2ML IJ SOLN: 4.0000 mg | Freq: Four times a day (QID) | INTRAMUSCULAR | Status: DC | PRN

## 2017-09-28 MED ORDER — PROPOFOL 10 MG/ML IV BOLUS
INTRAVENOUS | Status: AC
  Filled 2017-09-28: qty 20

## 2017-09-28 MED ORDER — BISACODYL 10 MG RE SUPP: 10.0000 mg | Freq: Two times a day (BID) | RECTAL | Status: DC | PRN

## 2017-09-28 MED ORDER — GABAPENTIN 300 MG PO CAPS
300.0000 mg | ORAL_CAPSULE | ORAL | Status: AC
  Administered 2017-09-28: 300 mg via ORAL
  Filled 2017-09-28: qty 1

## 2017-09-28 MED ORDER — ONDANSETRON HCL 4 MG/2ML IJ SOLN
INTRAMUSCULAR | Status: DC | PRN
  Administered 2017-09-28: 4 mg via INTRAVENOUS

## 2017-09-28 MED ORDER — LACTATED RINGERS IR SOLN
Status: DC | PRN
  Administered 2017-09-28: 1000 mL

## 2017-09-28 MED ORDER — POTASSIUM CHLORIDE CRYS ER 20 MEQ PO TBCR: 40.0000 meq | EXTENDED_RELEASE_TABLET | Freq: Two times a day (BID) | ORAL | Status: DC

## 2017-09-28 MED ORDER — SUGAMMADEX SODIUM 500 MG/5ML IV SOLN
INTRAVENOUS | Status: DC | PRN
Start: 2017-09-28 — End: 2017-09-28
  Administered 2017-09-28: 200 mg via INTRAVENOUS

## 2017-09-28 MED ORDER — SODIUM CHLORIDE 0.9 % IV SOLN
2.0000 g | INTRAVENOUS | Status: DC
  Filled 2017-09-28: qty 20

## 2017-09-28 MED ORDER — MIDAZOLAM HCL 5 MG/5ML IJ SOLN
INTRAMUSCULAR | Status: DC | PRN
  Administered 2017-09-28: 2 mg via INTRAVENOUS

## 2017-09-28 MED ORDER — FENTANYL CITRATE (PF) 250 MCG/5ML IJ SOLN
INTRAMUSCULAR | Status: AC
  Filled 2017-09-28: qty 5

## 2017-09-28 MED ORDER — BUPIVACAINE-EPINEPHRINE 0.5% -1:200000 IJ SOLN
INTRAMUSCULAR | Status: DC | PRN
  Administered 2017-09-28: 50 mL

## 2017-09-28 MED ORDER — MIDAZOLAM HCL 2 MG/2ML IJ SOLN
INTRAMUSCULAR | Status: AC
  Filled 2017-09-28: qty 2

## 2017-09-28 MED ORDER — ACETAMINOPHEN 650 MG RE SUPP: 650.0000 mg | Freq: Four times a day (QID) | RECTAL | Status: DC | PRN

## 2017-09-28 MED ORDER — LACTATED RINGERS IV BOLUS (SEPSIS): 1000.0000 mL | Freq: Three times a day (TID) | INTRAVENOUS | Status: DC | PRN

## 2017-09-28 MED ORDER — HYDROMORPHONE HCL 1 MG/ML IJ SOLN: 0.5000 mg | INTRAMUSCULAR | Status: DC | PRN

## 2017-09-28 MED ORDER — TRAMADOL HCL 50 MG PO TABS: 50.0000 mg | ORAL_TABLET | Freq: Four times a day (QID) | ORAL | 0 refills | Status: DC | PRN

## 2017-09-28 MED ORDER — VITAMIN D (ERGOCALCIFEROL) 1.25 MG (50000 UNIT) PO CAPS
50000.0000 [IU] | ORAL_CAPSULE | ORAL | Status: DC
  Filled 2017-09-28: qty 1

## 2017-09-28 MED ORDER — MUPIROCIN 2 % EX OINT
TOPICAL_OINTMENT | CUTANEOUS | Status: AC
  Filled 2017-09-28: qty 22

## 2017-09-28 MED ORDER — LACTATED RINGERS IV SOLN
INTRAVENOUS | Status: DC
  Administered 2017-09-28 (×2): via INTRAVENOUS

## 2017-09-28 MED ORDER — METOCLOPRAMIDE HCL 5 MG/ML IJ SOLN: 10.0000 mg | Freq: Four times a day (QID) | INTRAMUSCULAR | Status: DC | PRN

## 2017-09-28 MED ORDER — PHENOL 1.4 % MT LIQD
1.0000 | OROMUCOSAL | Status: DC | PRN
  Filled 2017-09-28: qty 177

## 2017-09-28 MED ORDER — SCOPOLAMINE 1 MG/3DAYS TD PT72
MEDICATED_PATCH | TRANSDERMAL | Status: AC
  Filled 2017-09-28: qty 1

## 2017-09-28 MED ORDER — SODIUM CHLORIDE 0.9 % IV SOLN
2.0000 g | Freq: Once | INTRAVENOUS | Status: AC
  Administered 2017-09-28: 2 g via INTRAVENOUS
  Filled 2017-09-28: qty 20

## 2017-09-28 MED ORDER — DEXAMETHASONE SODIUM PHOSPHATE 10 MG/ML IJ SOLN
INTRAMUSCULAR | Status: AC
  Filled 2017-09-28: qty 1

## 2017-09-28 MED ORDER — NAPROXEN 500 MG PO TABS: 500.0000 mg | ORAL_TABLET | Freq: Two times a day (BID) | ORAL | 1 refills | Status: DC | PRN

## 2017-09-28 MED ORDER — LIDOCAINE 2% (20 MG/ML) 5 ML SYRINGE
INTRAMUSCULAR | Status: DC | PRN
  Administered 2017-09-28: 100 mg via INTRAVENOUS

## 2017-09-28 MED ORDER — DIPHENHYDRAMINE HCL 12.5 MG/5ML PO ELIX: 12.5000 mg | ORAL_SOLUTION | Freq: Four times a day (QID) | ORAL | Status: DC | PRN

## 2017-09-28 MED ORDER — PROPOFOL 10 MG/ML IV BOLUS
INTRAVENOUS | Status: DC | PRN
  Administered 2017-09-28: 200 mg via INTRAVENOUS

## 2017-09-28 MED ORDER — METRONIDAZOLE IN NACL 5-0.79 MG/ML-% IV SOLN
500.0000 mg | INTRAVENOUS | Status: AC
  Administered 2017-09-28: 500 mg via INTRAVENOUS
  Filled 2017-09-28: qty 100

## 2017-09-28 MED ORDER — SIMETHICONE 80 MG PO CHEW
40.0000 mg | CHEWABLE_TABLET | Freq: Four times a day (QID) | ORAL | Status: DC | PRN
  Filled 2017-09-28: qty 1

## 2017-09-28 MED ORDER — ROCURONIUM BROMIDE 10 MG/ML (PF) SYRINGE
PREFILLED_SYRINGE | INTRAVENOUS | Status: AC
  Filled 2017-09-28: qty 5

## 2017-09-28 MED ORDER — METHOCARBAMOL 1000 MG/10ML IJ SOLN
1000.0000 mg | Freq: Four times a day (QID) | INTRAVENOUS | Status: DC | PRN
  Filled 2017-09-28 (×2): qty 10

## 2017-09-28 MED ORDER — CHLORHEXIDINE GLUCONATE CLOTH 2 % EX PADS: 6.0000 | MEDICATED_PAD | Freq: Once | CUTANEOUS | Status: DC

## 2017-09-28 MED ORDER — ONDANSETRON HCL 4 MG/2ML IJ SOLN
INTRAMUSCULAR | Status: AC
  Filled 2017-09-28: qty 2

## 2017-09-28 MED ORDER — DIPHENHYDRAMINE HCL 50 MG/ML IJ SOLN: 12.5000 mg | Freq: Four times a day (QID) | INTRAMUSCULAR | Status: DC | PRN

## 2017-09-28 MED ORDER — ACETAMINOPHEN 325 MG PO TABS
650.0000 mg | ORAL_TABLET | Freq: Four times a day (QID) | ORAL | Status: DC | PRN
  Administered 2017-09-28: 650 mg via ORAL
  Filled 2017-09-28: qty 2

## 2017-09-28 MED ORDER — ENOXAPARIN SODIUM 40 MG/0.4ML ~~LOC~~ SOLN
40.0000 mg | SUBCUTANEOUS | Status: DC
  Filled 2017-09-28: qty 0.4

## 2017-09-28 MED ORDER — LIP MEDEX EX OINT
1.0000 "application " | TOPICAL_OINTMENT | Freq: Two times a day (BID) | CUTANEOUS | Status: DC
  Filled 2017-09-28: qty 7

## 2017-09-28 MED ORDER — SODIUM CHLORIDE 0.9 % IV SOLN
INTRAVENOUS | Status: AC
  Filled 2017-09-28: qty 20

## 2017-09-28 MED ORDER — GABAPENTIN 300 MG PO CAPS: 300.0000 mg | ORAL_CAPSULE | Freq: Two times a day (BID) | ORAL | Status: DC

## 2017-09-28 MED ORDER — ADULT MULTIVITAMIN W/MINERALS CH: 1.0000 | ORAL_TABLET | Freq: Every day | ORAL | Status: DC

## 2017-09-28 MED ORDER — BUPIVACAINE-EPINEPHRINE 0.25% -1:200000 IJ SOLN: INTRAMUSCULAR | Status: DC | PRN

## 2017-09-28 MED ORDER — FAMOTIDINE IN NACL 20-0.9 MG/50ML-% IV SOLN
20.0000 mg | Freq: Once | INTRAVENOUS | Status: AC
  Administered 2017-09-28: 20 mg via INTRAVENOUS
  Filled 2017-09-28: qty 50

## 2017-09-28 MED ORDER — SCOPOLAMINE 1 MG/3DAYS TD PT72
1.0000 | MEDICATED_PATCH | TRANSDERMAL | Status: DC
  Administered 2017-09-28: 1 via TRANSDERMAL
  Filled 2017-09-28: qty 1

## 2017-09-28 MED ORDER — GUAIFENESIN-DM 100-10 MG/5ML PO SYRP: 10.0000 mL | ORAL_SOLUTION | ORAL | Status: DC | PRN

## 2017-09-28 MED ORDER — SODIUM CHLORIDE 0.9 % IV BOLUS (SEPSIS)
1000.0000 mL | Freq: Once | INTRAVENOUS | Status: AC
  Administered 2017-09-28: 1000 mL via INTRAVENOUS

## 2017-09-28 MED ORDER — 0.9 % SODIUM CHLORIDE (POUR BTL) OPTIME
TOPICAL | Status: DC | PRN
  Administered 2017-09-28 (×2): 1000 mL

## 2017-09-28 MED ORDER — CHOLECALCIFEROL 10 MCG (400 UNIT) PO TABS
800.0000 [IU] | ORAL_TABLET | Freq: Every day | ORAL | Status: DC
  Filled 2017-09-28: qty 2

## 2017-09-28 MED ORDER — ROCURONIUM BROMIDE 50 MG/5ML IV SOSY
PREFILLED_SYRINGE | INTRAVENOUS | Status: DC | PRN
  Administered 2017-09-28: 10 mg via INTRAVENOUS
  Administered 2017-09-28: 40 mg via INTRAVENOUS

## 2017-09-28 MED ORDER — MUPIROCIN CALCIUM 2 % EX CREA: TOPICAL_CREAM | Freq: Once | CUTANEOUS | Status: DC

## 2017-09-28 SURGICAL SUPPLY — 39 items
APPLIER CLIP 5 13 M/L LIGAMAX5 (MISCELLANEOUS) ×2
CABLE HIGH FREQUENCY MONO STRZ (ELECTRODE) ×2 IMPLANT
CHLORAPREP W/TINT 26ML (MISCELLANEOUS) ×2 IMPLANT
CLIP APPLIE 5 13 M/L LIGAMAX5 (MISCELLANEOUS) ×1 IMPLANT
COVER MAYO STAND STRL (DRAPES) ×2 IMPLANT
COVER SURGICAL LIGHT HANDLE (MISCELLANEOUS) ×2 IMPLANT
DECANTER SPIKE VIAL GLASS SM (MISCELLANEOUS) ×2 IMPLANT
DRAIN CHANNEL 19F RND (DRAIN) IMPLANT
DRAPE C-ARM 42X120 X-RAY (DRAPES) ×2 IMPLANT
DRAPE WARM FLUID 44X44 (DRAPE) ×2 IMPLANT
DRSG TEGADERM 4X4.75 (GAUZE/BANDAGES/DRESSINGS) ×2 IMPLANT
ELECT REM PT RETURN 15FT ADLT (MISCELLANEOUS) ×2 IMPLANT
ENDOLOOP SUT PDS II  0 18 (SUTURE) ×1
ENDOLOOP SUT PDS II 0 18 (SUTURE) ×1 IMPLANT
EVACUATOR SILICONE 100CC (DRAIN) IMPLANT
GAUZE SPONGE 2X2 8PLY STRL LF (GAUZE/BANDAGES/DRESSINGS) ×1 IMPLANT
GLOVE ECLIPSE 8.0 STRL XLNG CF (GLOVE) ×2 IMPLANT
GLOVE INDICATOR 8.0 STRL GRN (GLOVE) ×2 IMPLANT
GOWN STRL REUS W/TWL XL LVL3 (GOWN DISPOSABLE) ×4 IMPLANT
IRRIG SUCT STRYKERFLOW 2 WTIP (MISCELLANEOUS) ×2
IRRIGATION SUCT STRKRFLW 2 WTP (MISCELLANEOUS) ×1 IMPLANT
KIT BASIN OR (CUSTOM PROCEDURE TRAY) ×2 IMPLANT
PAD POSITIONING PINK XL (MISCELLANEOUS) ×2 IMPLANT
POSITIONER SURGICAL ARM (MISCELLANEOUS) IMPLANT
POUCH RETRIEVAL ECOSAC 10 (ENDOMECHANICALS) IMPLANT
POUCH RETRIEVAL ECOSAC 10MM (ENDOMECHANICALS)
SCISSORS LAP 5X35 DISP (ENDOMECHANICALS) ×2 IMPLANT
SET CHOLANGIOGRAPH MIX (MISCELLANEOUS) ×2 IMPLANT
SHEARS HARMONIC ACE PLUS 36CM (ENDOMECHANICALS) ×2 IMPLANT
SPONGE GAUZE 2X2 STER 10/PKG (GAUZE/BANDAGES/DRESSINGS) ×1
SUT MNCRL AB 4-0 PS2 18 (SUTURE) ×2 IMPLANT
SUT PDS AB 1 CT1 27 (SUTURE) ×6 IMPLANT
SYR 20CC LL (SYRINGE) ×2 IMPLANT
TOWEL OR 17X26 10 PK STRL BLUE (TOWEL DISPOSABLE) ×2 IMPLANT
TOWEL OR NON WOVEN STRL DISP B (DISPOSABLE) ×2 IMPLANT
TRAY LAPAROSCOPIC (CUSTOM PROCEDURE TRAY) ×2 IMPLANT
TROCAR BLADELESS OPT 5 100 (ENDOMECHANICALS) ×2 IMPLANT
TROCAR BLADELESS OPT 5 150 (ENDOMECHANICALS) ×2 IMPLANT
TUBING INSUF HEATED (TUBING) ×2 IMPLANT

## 2017-09-28 NOTE — ED Notes (Signed)
Report given to [facility] with [facility].

## 2017-09-28 NOTE — Progress Notes (Signed)
Dr. Excell Seltzer aware via phone no orders for diet other than ice chips with possible plans to dc this evening. See new order received for clears and advance diet as tolerated.

## 2017-09-28 NOTE — ED Notes (Signed)
Called report to [facility] ED charge nurse.

## 2017-09-28 NOTE — Progress Notes (Signed)
Assessment unchanged. Pt verbalized understanding of dc instructions through teach back including follow up care and when to call the doctor. Scripts x 2 given as provided by MD. Discharged via wc accompanied by nurse and mother to front entrance to meet awaiting vehicle to carry home.

## 2017-09-28 NOTE — Anesthesia Postprocedure Evaluation (Signed)
Anesthesia Post Note  Patient: Teresa Wong  Procedure(s) Performed: LAPAROSCOPIC CHOLECYSTECTOMY SINGLE SITE WITH CHOLANGIOGRAM (N/A Abdomen)     Patient location during evaluation: PACU Anesthesia Type: General Level of consciousness: awake and alert Pain management: pain level controlled Vital Signs Assessment: post-procedure vital signs reviewed and stable Respiratory status: spontaneous breathing, nonlabored ventilation, respiratory function stable and patient connected to nasal cannula oxygen Cardiovascular status: blood pressure returned to baseline and stable Postop Assessment: no apparent nausea or vomiting Anesthetic complications: no    Last Vitals:  Vitals:   09/28/17 1415 09/28/17 1431  BP: 129/70 126/65  Pulse: 86 88  Resp: (!) 24 20  Temp: 36.9 C   SpO2: 94% 95%    Last Pain:  Vitals:   09/28/17 1441  TempSrc:   PainSc: 1                  Ryan P Ellender

## 2017-09-28 NOTE — Anesthesia Preprocedure Evaluation (Addendum)
Anesthesia Evaluation  Patient identified by MRN, date of birth, ID band Patient awake    Reviewed: Allergy & Precautions, NPO status , Patient's Chart, lab work & pertinent test results  Airway Mallampati: II  TM Distance: >3 FB Neck ROM: Full    Dental no notable dental hx.    Pulmonary neg pulmonary ROS,    Pulmonary exam normal breath sounds clear to auscultation       Cardiovascular hypertension, Normal cardiovascular exam Rhythm:Regular Rate:Normal  ECG: NSR, rate 95   Neuro/Psych PSYCHIATRIC DISORDERS Depression Deafness in right ear    GI/Hepatic negative GI ROS, Neg liver ROS,   Endo/Other  negative endocrine ROS  Renal/GU negative Renal ROS     Musculoskeletal negative musculoskeletal ROS (+)   Abdominal (+) + obese,   Peds  Hematology negative hematology ROS (+)   Anesthesia Other Findings Acute Cholecystitus  Reproductive/Obstetrics hcg negative                            Anesthesia Physical Anesthesia Plan  ASA: II  Anesthesia Plan: General   Post-op Pain Management:    Induction: Intravenous  PONV Risk Score and Plan: 4 or greater and Scopolamine patch - Pre-op, Midazolam, Dexamethasone, Ondansetron and Treatment may vary due to age or medical condition  Airway Management Planned: Oral ETT  Additional Equipment:   Intra-op Plan:   Post-operative Plan: Extubation in OR  Informed Consent: I have reviewed the patients History and Physical, chart, labs and discussed the procedure including the risks, benefits and alternatives for the proposed anesthesia with the patient or authorized representative who has indicated his/her understanding and acceptance.   Dental advisory given  Plan Discussed with: CRNA  Anesthesia Plan Comments:         Anesthesia Quick Evaluation

## 2017-09-28 NOTE — Discharge Instructions (Signed)
LAPAROSCOPIC SURGERY: POST OP INSTRUCTIONS  ######################################################################  EAT Gradually transition to a high fiber diet with a fiber supplement over the next few weeks after discharge.  Start with a pureed / full liquid diet (see below)  WALK Walk an hour a day.  Control your pain to do that.    CONTROL PAIN Control pain so that you can walk, sleep, tolerate sneezing/coughing, go up/down stairs.  HAVE A BOWEL MOVEMENT DAILY Keep your bowels regular to avoid problems.  OK to try a laxative to override constipation.  OK to use an antidairrheal to slow down diarrhea.  Call if not better after 2 tries  CALL IF YOU HAVE PROBLEMS/CONCERNS Call if you are still struggling despite following these instructions. Call if you have concerns not answered by these instructions  ######################################################################    1. DIET: Follow a light bland diet the first 24 hours after arrival home, such as soup, liquids, crackers, etc.  Be sure to include lots of fluids daily.  Avoid fast food or heavy meals as your are more likely to get nauseated.  Eat a low fat the next few days after surgery.   2. Take your usually prescribed home medications unless otherwise directed. 3. PAIN CONTROL: a. Pain is best controlled by a usual combination of three different methods TOGETHER: i. Ice/Heat ii. Over the counter pain medication iii. Prescription pain medication b. Most patients will experience some swelling and bruising around the incisions.  Ice packs or heating pads (30-60 minutes up to 6 times a day) will help. Use ice for the first few days to help decrease swelling and bruising, then switch to heat to help relax tight/sore spots and speed recovery.  Some people prefer to use ice alone, heat alone, alternating between ice & heat.  Experiment to what works for you.  Swelling and bruising can take several weeks to resolve.   c. It is  helpful to take an over-the-counter pain medication regularly for the first few weeks.  Choose one of the following that works best for you: i. Naproxen (Aleve, etc)  Two 235m tabs twice a day ii. Ibuprofen (Advil, etc) Three 2067mtabs four times a day (every meal & bedtime) iii. Acetaminophen (Tylenol, etc) 500-65049mour times a day (every meal & bedtime) d. A  prescription for pain medication (such as oxycodone, hydrocodone, etc) should be given to you upon discharge.  Take your pain medication as prescribed.  i. If you are having problems/concerns with the prescription medicine (does not control pain, nausea, vomiting, rash, itching, etc), please call us [phone] see if we need to switch you to a different pain medicine that will work better for you and/or control your side effect better. ii. If you need a refill on your pain medication, please contact your pharmacy.  They will contact our office to request authorization. Prescriptions will not be filled after 5 pm or on week-ends. 4. Avoid getting constipated.  Between the surgery and the pain medications, it is common to experience some constipation.  Increasing fluid intake and taking a fiber supplement (such as Metamucil, Citrucel, FiberCon, MiraLax, etc) 1-2 times a day regularly will usually help prevent this problem from occurring.  A mild laxative (prune juice, Milk of Magnesia, MiraLax, etc) should be taken according to package directions if there are no bowel movements after 48 hours.   5. Watch out for diarrhea.  If you have many loose bowel movements, simplify your diet to bland foods & liquids for  a few days.  Stop any stool softeners and decrease your fiber supplement.  Switching to mild anti-diarrheal medications (Kayopectate, Pepto Bismol) can help.  If this worsens or does not improve, please call us. 6. Wash / shower every day.  You may shower over the dressings as they are waterproof.  Continue to shower over incision(s)  after the dressing is off. 7. Remove your waterproof bandages 5 days after surgery.  You may leave the incision open to air.  You may replace a dressing/Band-Aid to cover the incision for comfort if you wish.  8. ACTIVITIES as tolerated:   a. You may resume regular (light) daily activities beginning the next day--such as daily self-care, walking, climbing stairs--gradually increasing activities as tolerated.  If you can walk 30 minutes without difficulty, it is safe to try more intense activity such as jogging, treadmill, bicycling, low-impact aerobics, swimming, etc. b. Save the most intensive and strenuous activity for last such as sit-ups, heavy lifting, contact sports, etc  Refrain from any heavy lifting or straining until you are off narcotics for pain control.   c. DO NOT PUSH THROUGH PAIN.  Let pain be your guide: If it hurts to do something, don't do it.  Pain is your body warning you to avoid that activity for another week until the pain goes down. d. You may drive when you are no longer taking prescription pain medication, you can comfortably wear a seatbelt, and you can safely maneuver your car and apply brakes. e. Dennis Bast may have sexual intercourse when it is comfortable.  9. FOLLOW UP in our office a. Please call [facility] at [phone] to set up an appointment to see your surgeon in the office for a follow-up appointment approximately 2-3 weeks after your surgery. b. Make sure that you call for this appointment the day you arrive home to insure a convenient appointment time. 10. IF YOU HAVE DISABILITY OR FAMILY LEAVE FORMS, BRING THEM TO THE OFFICE FOR PROCESSING.  DO NOT GIVE THEM TO YOUR DOCTOR.   WHEN TO CALL us [phone]: 1. Poor pain control 2. Reactions / problems with new medications (rash/itching, nausea, etc)  3. Fever over 101.5 F (38.5 C) 4. Inability to urinate 5. Nausea and/or vomiting 6. Worsening swelling or bruising 7. Continued bleeding from incision. 8. Increased  pain, redness, or drainage from the incision   The clinic staff is available to answer your questions during regular business hours (8:30am-5pm).  Please dont hesitate to call and ask to speak to one of our nurses for clinical concerns.   If you have a medical emergency, go to the nearest emergency room or call 911.  A surgeon from [facility] is always on call at the [facility], [place], [place], [place], [place]  [address] ? MAIN: [phone] ? TOLL FREE: [phone] ?  FAX [phone] [email or web address]    Cholecystitis Cholecystitis is inflammation of the gallbladder. It is often called a gallbladder attack. The gallbladder is a pear-shaped organ that lies beneath the liver on the right side of the body. The gallbladder stores bile, which is a fluid that helps the body to digest fats. If bile builds up in your gallbladder, your gallbladder becomes inflamed. This condition may occur suddenly (be acute). Repeat episodes of acute cholecystitis or prolonged episodes may lead to a long-term (chronic) condition. Cholecystitis is serious and it requires treatment. What are the causes? The most common cause of this  condition is gallstones. Gallstones can block the tube (duct) that carries bile out of your gallbladder. This causes bile to build up. Other causes of this condition include:  Damage to the gallbladder due to a decrease in blood flow.  Infections in the bile ducts.  Scars or kinks in the bile ducts.  Tumors in the liver, pancreas, or gallbladder.  What increases the risk? This condition is more likely to develop in:  People who have sickle cell disease.  People who take birth control pills or use estrogen.  People who have alcoholic liver disease.  People who have liver cirrhosis.  People who have their nutrition delivered through a vein (parenteral nutrition).  People who do not eat or drink  (do fasting) for a long period of time.  People who are obese.  People who have rapid weight loss.  People who are pregnant.  People who have increased triglyceride levels.  People who have pancreatitis.  What are the signs or symptoms? Symptoms of this condition include:  Abdominal pain, especially in the upper right area of the abdomen.  Abdominal tenderness or bloating.  Nausea.  Vomiting.  Fever.  Chills.  Yellowing of the skin and the whites of the eyes (jaundice).  How is this diagnosed? This condition is diagnosed with a medical history and physical exam. You may also have other tests, including:  Imaging tests, such as: ? An ultrasound of the gallbladder. ? A CT scan of the abdomen. ? A gallbladder nuclear scan (HIDA scan). This scan allows your health care provider to see the bile moving from your liver to your gallbladder and to your small intestine. ? MRI.  Blood tests, such as: ? A complete blood count, because the white blood cell count may be higher than normal. ? Liver function tests, because some levels may be higher than normal with certain types of gallstones.  How is this treated? Treatment may include:  Fasting for a certain amount of time.  IV fluids.  Medicine to treat pain or vomiting.  Antibiotic medicine.  Surgery to remove your gallbladder (cholecystectomy). This may happen immediately or at a later time.  Follow these instructions at home: Home care will depend on your treatment. In general:  Take over-the-counter and prescription medicines only as told by your health care provider.  If you were prescribed an antibiotic medicine, take it as told by your health care provider. Do not stop taking the antibiotic even if you start to feel better.  Follow instructions from your health care provider about what to eat or drink. When you are allowed to eat, avoid eating or drinking anything that triggers your symptoms.  Keep all  follow-up visits as told by your health care provider. This is important.  Contact a health care provider if:  Your pain is not controlled with medicine.  You have a fever. Get help right away if:  Your pain moves to another part of your abdomen or to your back.  You continue to have symptoms or you develop new symptoms even with treatment. This information is not intended to replace advice given to you by your health care provider. Make sure you discuss any questions you have with your health care provider. Document Released: 07/16/2005 Document Revised: 11/24/2015 Document Reviewed: 10/27/2014 Elsevier Interactive Patient Education  2018 [employer].

## 2017-09-28 NOTE — ED Notes (Signed)
ED TO INPATIENT HANDOFF REPORT  Name/Age/Gender Teresa Wong 42 y.o. female  Code Status    Code Status Orders  (From admission, onward)        Start     Ordered   09/28/17 0348  Full code  Continuous     09/28/17 0348    Code Status History    Date Active Date Inactive Code Status Order ID Comments User Context   11/26/2013 17:54 11/29/2013 14:59 Full Code [id number]  Daria Pastures, MD Inpatient   11/26/2013 08:21 11/26/2013 17:08 Full Code [id number]  Orinda Kenner, RN Inpatient      Home/SNF/Other Home  Chief Complaint sob/chest pain  Level of Care/Admitting Diagnosis ED Disposition    ED Disposition Condition [place] [facility] Area: [facility] [[id number]]  Level of Care: Med-Surg [16]  Diagnosis: Acute calculous cholecystitis [220254]  Admitting Physician: [place], [place]  Attending Physician: CCS, MD [[id number]]  Estimated length of stay: past midnight tomorrow  Certification:: I certify this patient will need inpatient services for at least 2 midnights  PT Class (Do Not Modify): Inpatient [101]  PT Acc Code (Do Not Modify): Private [1]       Medical History Past Medical History:  Diagnosis Date  . Abnormal Pap smear   . 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  . 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 History of LEEP (loop electrosurgical excision procedure) of cervix complicating pregnancy 2706  . Meningitis 1984  . Postpartum depression   . Pregnancy induced hypertension 2010, 2012   History - resolved - no problems with current pregnancy 2015    Allergies No Known Allergies  IV Location/Drains/Wounds Patient Lines/Drains/Airways Status   Active Line/Drains/Airways    Name:   Placement date:   Placement time:   Site:   Days:   Peripheral IV 09/27/17 Right Antecubital   09/27/17    2230    Antecubital   1   Incision (Closed) 11/26/13 Abdomen Other (Comment)   11/26/13    1450     1402    Incision (Closed) 11/26/13 Perineum Other (Comment)   11/26/13    1450     1402          Labs/Imaging Results for orders placed or performed during the hospital encounter of 09/27/17 (from the past 48 hour(s))  Pregnancy, urine     Status: None   Collection Time: 09/27/17  9:58 PM  Result Value Ref Range   Preg Test, Ur NEGATIVE NEGATIVE    Comment:        THE SENSITIVITY OF THIS METHODOLOGY IS >20 mIU/mL. Performed at [facility], [address] [place], [place] [address]   CBC     Status: Abnormal   Collection Time: 09/27/17 10:35 PM  Result Value Ref Range   WBC 11.3 (H) 4.0 - 10.5 K/uL   RBC 4.42 3.87 - 5.11 MIL/uL   Hemoglobin 12.9 12.0 - 15.0 g/dL   HCT 37.0 36.0 - 46.0 %   MCV 83.7 78.0 - 100.0 fL   MCH 29.2 26.0 - 34.0 pg   MCHC 34.9 30.0 - 36.0 g/dL   RDW 13.2 11.5 - 15.5 %   Platelets 394 150 - 400 K/uL    Comment: Performed at [facility], [address] [place], [place] [address]  Troponin I     Status:  None   Collection Time: 09/27/17 10:35 PM  Result Value Ref Range   Troponin I <0.03 <0.03 ng/mL    Comment: Performed at [facility], [address] [place], [place] [address]  Comprehensive metabolic panel     Status: Abnormal   Collection Time: 09/27/17 10:35 PM  Result Value Ref Range   Sodium 137 135 - 145 mmol/L   Potassium 3.1 (L) 3.5 - 5.1 mmol/L   Chloride 103 101 - 111 mmol/L   CO2 23 22 - 32 mmol/L   Glucose, Bld 118 (H) 65 - 99 mg/dL   BUN 9 6 - 20 mg/dL   Creatinine, Ser 0.53 0.44 - 1.00 mg/dL   Calcium 9.2 8.9 - 10.3 mg/dL   Total Protein 7.9 6.5 - 8.1 g/dL   Albumin 4.6 3.5 - 5.0 g/dL   AST 19 15 - 41 U/L   ALT 16 14 - 54 U/L   Alkaline Phosphatase 72 38 - 126 U/L   Total Bilirubin 0.1 (L) 0.3 - 1.2 mg/dL   GFR calc non Af Amer >60 >60 mL/min   GFR calc Af Amer >60 >60 mL/min    Comment: (NOTE) The eGFR has been calculated using the CKD EPI equation. This calculation has not been validated in  all clinical situations. eGFR's persistently <60 mL/min signify possible Chronic Kidney Disease.    Anion gap 11 5 - 15    Comment: Performed at [facility], [address] [place], [place] [address]  Lipase, blood     Status: None   Collection Time: 09/27/17 10:35 PM  Result Value Ref Range   Lipase 28 11 - 51 U/L    Comment: Performed at [facility] , [address] [place], [place] [address]   Dg Chest 2 View  Result Date: 09/27/2017 CLINICAL DATA:  Acute onset chest pain under the right breast EXAM: CHEST  2 VIEW COMPARISON:  05/30/2015 FINDINGS: The heart size and mediastinal contours are within normal limits. Both lungs are clear. The visualized skeletal structures are unremarkable. IMPRESSION: No active cardiopulmonary disease. Electronically Signed   By: Donavan Foil M.D.   On: 09/27/2017 22:25   US Abdomen Limited Ruq  Result Date: 09/27/2017 CLINICAL DATA:  Severe postprandial epigastric pain radiating into the right upper quadrant with nausea and vomiting today. EXAM: ULTRASOUND ABDOMEN LIMITED RIGHT UPPER QUADRANT COMPARISON:  None. FINDINGS: Gallbladder: A stone impacted in the gallbladder neck measures 2.8 cm in diameter. There is some sludge within the gallbladder. Scattered foci of ring down artifact are also identified off the gallbladder wall consistent with adenomyomatosis. Sonographer reports positive Murphy's sign. Gallbladder wall thickness is normal at 0.3 cm. No pericholecystic fluid. Common bile duct: Diameter: 0.4 cm Liver: No focal lesion identified. Within normal limits in parenchymal echogenicity. Portal vein is patent on color Doppler imaging with normal direction of blood flow towards the liver. IMPRESSION: 2.8 cm stone impacted in the gallbladder neck and gallbladder sludge. Changes of adenomyomatosis are identified. Sonographer reports positive Murphy's sign. No gallbladder wall thickening or pericholecystic fluid. Electronically Signed   By:  Inge Rise M.D.   On: 09/27/2017 23:34    Pending Labs Unresulted Labs (From admission, onward)   Start     Ordered   10/05/17 0500  Creatinine, serum  (enoxaparin (LOVENOX)    CrCl >/= 30 ml/min)  Weekly,   R    Comments:  while on enoxaparin therapy    09/28/17 0348  09/28/17 0349  Magnesium  Add-on,   R    Comments:  If Mag level is <2, give 3g IV Magnesium sulfate x 1    09/28/17 0348   09/28/17 0348  HIV antibody (Routine Testing)  Once,   R     09/28/17 0348      Vitals/Pain Today's Vitals   09/28/17 0210 09/28/17 0429 09/28/17 0611 09/28/17 0633  BP: 121/69 (!) 139/96  115/60  Pulse: 80 75  86  Resp: 16 20  18   Temp: 98.5 F (36.9 C) 98.8 F (37.1 C)    TempSrc: Oral Oral    SpO2: 98% 100%  99%  Weight:      PainSc: 0-No pain  2      Isolation Precautions No active isolations  Medications Medications  potassium chloride 10 mEq in 100 mL IVPB (0 mEq Intravenous Stopped 09/28/17 0544)  Chlorhexidine Gluconate Cloth 2 % PADS 6 each (not administered)    And  Chlorhexidine Gluconate Cloth 2 % PADS 6 each (not administered)  gabapentin (NEURONTIN) capsule 300 mg (not administered)  acetaminophen (TYLENOL) tablet 1,000 mg (not administered)  cefTRIAXone (ROCEPHIN) 2 g in sodium chloride 0.9 % 100 mL IVPB (not administered)    And  metroNIDAZOLE (FLAGYL) IVPB 500 mg (not administered)  celecoxib (CELEBREX) capsule 200 mg (not administered)  potassium chloride SA (K-DUR,KLOR-CON) CR tablet 40 mEq (not administered)  lactated ringers bolus 1,000 mL (not administered)  lactated ringers bolus 1,000 mL (not administered)  HYDROmorphone (DILAUDID) injection 0.5-2 mg (not administered)  methocarbamol (ROBAXIN) 1,000 mg in dextrose 5 % 50 mL IVPB (not administered)  gabapentin (NEURONTIN) capsule 300 mg (not administered)  prochlorperazine (COMPAZINE) injection 5-10 mg (not administered)  metoCLOPramide (REGLAN) injection 10 mg (not administered)  lip balm  (CARMEX) ointment 1 application (not administered)  magic mouthwash (not administered)  bisacodyl (DULCOLAX) suppository 10 mg (not administered)  guaiFENesin-dextromethorphan (ROBITUSSIN DM) 100-10 MG/5ML syrup 10 mL (not administered)  hydrocortisone (ANUSOL-HC) 2.5 % rectal cream 1 application (not administered)  alum & mag hydroxide-simeth (MAALOX/MYLANTA) 200-200-20 MG/5ML suspension 30 mL (not administered)  hydrocortisone cream 1 % 1 application (not administered)  menthol-cetylpyridinium (CEPACOL) lozenge 3 mg (not administered)  phenol (CHLORASEPTIC) mouth spray 1-2 spray (not administered)  enoxaparin (LOVENOX) injection 40 mg (not administered)  lactated ringers infusion (not administered)  acetaminophen (TYLENOL) tablet 650 mg (not administered)    Or  acetaminophen (TYLENOL) suppository 650 mg (not administered)  diphenhydrAMINE (BENADRYL) 12.5 MG/5ML elixir 12.5 mg (not administered)    Or  diphenhydrAMINE (BENADRYL) injection 12.5 mg (not administered)  ondansetron (ZOFRAN-ODT) disintegrating tablet 4 mg (not administered)    Or  ondansetron (ZOFRAN) injection 4 mg (not administered)  simethicone (MYLICON) chewable tablet 40 mg (not administered)  cefTRIAXone (ROCEPHIN) 2 g in sodium chloride 0.9 % 100 mL IVPB (not administered)  potassium chloride 10 mEq in 100 mL IVPB (0 mEq Intravenous Stopped 09/28/17 0652)  multivitamin with minerals tablet 1 tablet (not administered)  Vitamin D (Ergocalciferol) (DRISDOL) capsule 50,000 Units (not administered)  cholecalciferol (VITAMIN D) tablet 800 Units (not administered)  pantoprazole (PROTONIX) injection 40 mg (40 mg Intravenous Given 09/27/17 2255)  ondansetron (ZOFRAN) injection 4 mg (4 mg Intravenous Given 09/27/17 2252)  sodium chloride 0.9 % bolus 1,000 mL (0 mLs Intravenous Stopped 09/28/17 0212)  cefTRIAXone (ROCEPHIN) 2 g in sodium chloride 0.9 % 100 mL IVPB (0 g Intravenous Stopped 09/28/17 0121)  fentaNYL (SUBLIMAZE) injection  50 mcg (50 mcg Intravenous Given 09/28/17 0121)  Mobility walks  

## 2017-09-28 NOTE — Anesthesia Procedure Notes (Addendum)
Procedure Name: Intubation Date/Time: 09/28/2017 12:08 PM Performed by: West Pugh, CRNA Pre-anesthesia Checklist: Patient identified, Emergency Drugs available, Suction available, Patient being monitored and Timeout performed Patient Re-evaluated:Patient Re-evaluated prior to induction Oxygen Delivery Method: Circle system utilized Preoxygenation: Pre-oxygenation with 100% oxygen Induction Type: IV induction Ventilation: Mask ventilation without difficulty Laryngoscope Size: Mac and 4 Grade View: Grade I Tube type: Oral Tube size: 7.5 mm Number of attempts: 1 Airway Equipment and Method: Stylet Placement Confirmation: ETT inserted through vocal cords under direct vision,  positive ETCO2,  CO2 detector and breath sounds checked- equal and bilateral Secured at: 22 cm Tube secured with: Tape Dental Injury: Teeth and Oropharynx as per pre-operative assessment

## 2017-09-28 NOTE — Op Note (Signed)
09/28/2017  PATIENT:  Teresa Wong  42 y.o. female  Patient Care Team: Ma Hillock, DO as PCP - General (Family Medicine) Sater, Nanine Means, MD as Consulting Physician (Neurology)  PRE-OPERATIVE DIAGNOSIS:    Acute on Chronic Calculus Cholecystitis  POST-OPERATIVE DIAGNOSIS:   Acute on Chronic Calculus Cholecystitis   Liver: Fatty steatohepatitis  PROCEDURE:  SINGLE SITE Laparoscopic cholecystectomy with intraoperative cholangiogram  SURGEON:  Adin Hector, MD, FACS.  ASSISTANT: RNFA   ANESTHESIA:    General with endotracheal intubation Local anesthetic as a field block  EBL:  (See Anesthesia Intraoperative Record) No intake/output data recorded.  Delay start of Pharmacological VTE agent (>24hrs) due to surgical blood loss or risk of bleeding:  no  DRAINS: None   SPECIMEN: Gallbladder    DISPOSITION OF SPECIMEN:  PATHOLOGY  COUNTS:  YES  PLAN OF CARE: Discharge to home after PACU  PATIENT DISPOSITION:  PACU - hemodynamically stable.  INDICATION: Pleasant obese female with episode of epigastric pain radiation to back with nausea and vomiting.  Ultrasound with gallstones and Murphy sign.  Suspicious for acute on chronic cholecystitis.  The rest of the differential diagnosis was underwhelming.  I recommended admission, antibiotics, urgent cholecystectomy.  The anatomy & physiology of hepatobiliary & pancreatic function was discussed.  The pathophysiology of gallbladder dysfunction was discussed.  Natural history risks without surgery was discussed.   I feel the risks of no intervention will lead to serious problems that outweigh the operative risks; therefore, I recommended cholecystectomy to remove the pathology.  I explained laparoscopic techniques with possible need for an open approach.  Probable cholangiogram to evaluate the bilary tract was explained as well.    Risks such as bleeding, infection, abscess, leak, injury to other organs, need for further  treatment, heart attack, death, and other risks were discussed.  I noted a good likelihood this will help address the problem.  Possibility that this will not correct all abdominal symptoms was explained.  Goals of post-operative recovery were discussed as well.  We will work to minimize complications.  An educational handout further explaining the pathology and treatment options was given as well.  Questions were answered.  The patient expresses understanding & wishes to proceed with surgery.  Husband and mother agree.  OR FINDINGS: Dense omental chronic adhesions to most of the gallbladder strongly suspicious for chronic cholecystitis.  Some gallbladder wall color changes and thickening consistent with chronic cholecystitis as well.  Very distended with some edema suspicious for acute flare.  Cholangiogram with classic biliary anatomy.  No obstruction dilatation or leak.  Liver: Fatty steatohepatitis  DESCRIPTION:   The patient was identified & brought in the operating room. The patient was positioned supine with arms tucked. SCDs were active during the entire case. The patient underwent general anesthesia without any difficulty.  The abdomen was prepped and draped in a sterile fashion. A Surgical Timeout confirmed our plan.  I made a transverse curvilinear incision through the superior umbilical fold.  I placed a 21m long port through the supraumbilical fascia using a modified Hassan cutdown technique with umbilical stalk fascial countertraction. I began carbon dioxide insufflation.  No change in end tidal CO2 measurement.   Camera inspection revealed no injury. There were no adhesions to the anterior abdominal wall supraumbilically.  I proceeded to continue with single site technique. I placed a #5 port in left upper aspect of the wound. I placed a 5 mm atraumatic grasper in the right inferior aspect of the  wound.  I turned attention to the right upper quadrant.  Could see the dome of the  gallbladder with moderate omental adhesions to it.  The gallbladder fundus was elevated cephalad. I freed adhesions to the ventral surface of the gallbladder off carefully.   Moderately thick omental adhesions.  Started left lateral and then worked more medially.  Eventually exposed an enlarged dilated gallbladder.  I freed the peritoneal coverings between the gallbladder and the liver on the posteriolateral and anteriomedial walls. I alternated between Harmonic & blunt Maryland dissection to help get a good critical view of the cystic artery and cystic duct.  It was thickened and inflamed and actually adhesions to the liver were somewhat foreshortened and twisted.  Therefore I decided to completely mobilize the gallbladder off the liver in a modified dome down technique.  With this I could better isolate and see the critical structures to get a good critical view of the infundibulum and cystic duct. I dissected out the cystic artery; and, after getting a good 360 view, ligated the anterior & posterior branches of the cystic artery close on the infundibulum using the Harmonic ultrasonic dissection.  Surgical clips as the dominant cystic artery was thickened and the lymph node of Calot was thickened and inflamed and edematous.  I skeletonized the cystic duct.  I placed a clip on the infundibulum. I did a partial cystic duct-otomy and ensured patency. I placed a 5 [place] cholangiocatheter through a puncture site at the right subcostal ridge of the abdominal wall and directed it into the cystic duct.  We ran a cholangiogram with dilute radio-opaque contrast and continuous fluoroscopy. Contrast flowed from a side branch consistent with cystic duct cannulization. Contrast flowed up the common hepatic duct into the right and left intrahepatic chains out to secondary radicals. Contrast flowed down the common bile duct easily across the normal ampulla into the duodenum.  This was consistent with a normal  cholangiogram.  I removed the cholangiocatheter.   I ligated the cystic duct near its takeoff from the common bile duct with a 0 PDS Endoloop going completely around the gallbladder infundibulum down to the base of the cystic duct.  I then placed clips on the cystic duct x4.   I completed cystic duct transection.  Placed the gallbladder inside and eco-sac bag given the inflammation and edema.  I ensured hemostasis on the gallbladder fossa of the liver and elsewhere. I inspected the rest of the abdomen & detected no injury nor bleeding elsewhere.  I removed the gallbladder out the supraumbilical fascia.  It was thickened with a large gallstone, so I had open up the fascia 25 mm.  I closed the fascia transversely using #1 PDS interrupted stitches. I closed the skin using 4-0 monocryl stitch.  Sterile dressing was applied. The patient was extubated & arrived in the PACU in stable condition..  I had discussed postoperative care with the patient in the holding area. I discussed operative findings, updated the patient's status, discussed probable steps to recovery, and gave postoperative recommendations to the patient and family.  Recommendations were made.  Questions were answered.  They expressed understanding & appreciation.  Because she is rather healthy and gallbladder was not severely inflamed, we will see if she can be discharged later today versus watch overnight and possible discharge the following day.  Patient is motivated to try and be discharged tonight.  We will see.  Adin Hector, M.D., F.A.C.S. Gastrointestinal and Minimally Invasive Surgery [facility]  [address], [place], [place] [address] [phone] Main / Paging  09/28/2017 1:31 PM

## 2017-09-28 NOTE — Transfer of Care (Signed)
Immediate Anesthesia Transfer of Care Note  Patient: [name]  Procedure(s) Performed: LAPAROSCOPIC CHOLECYSTECTOMY SINGLE SITE WITH CHOLANGIOGRAM (N/A Abdomen)  Patient Location: PACU  Anesthesia Type:General  Level of Consciousness: awake, alert , oriented and patient cooperative  Airway & Oxygen Therapy: Patient Spontanous Breathing and Patient connected to face mask oxygen  Post-op Assessment: Report given to RN and Post -op Vital signs reviewed and stable  Post vital signs: Reviewed and stable  Last Vitals:  Vitals:   09/28/17 1009 09/28/17 1342  BP: (!) 141/79   Pulse: 83   Resp: 18   Temp: 37.1 C 37.1 C  SpO2: 97%     Last Pain:  Vitals:   09/28/17 1009  TempSrc: Oral  PainSc:          Complications: No apparent anesthesia complications

## 2017-09-28 NOTE — ED Notes (Signed)
Dr Johney Maine paged per Leveda Anna

## 2017-09-28 NOTE — H&P (Signed)
[address] [place], [place] [address] Phone: [phone] FAX: [place]  October 17, 1975 [id number]  CARE TEAM:  PCP: Ma Hillock, DO  Outpatient Care Team: Patient Care Team: Ma Hillock, DO as PCP - General (Family Medicine) Felecia Shelling, Nanine Means, MD as Consulting Physician (Neurology)  Inpatient Treatment Team: Treatment Team: Attending Provider: Nolon Nations, MD; Consulting Physician: Edison Pace, Md, MD; Registered Nurse: Caren Griffins, RN   This patient is a 42 y.o.female who presents today for surgical evaluation at the request of Dr Ralene Bathe.   Chief complaint / Reason for evaluation: Epigastric pain.  Probable cholecystitis  Pleasant obese female with episode of severe upper abdominal pain radiating to her right side and back.  Triggered early afternoon.  She had a [place] sandwich for lunch.  She then had some pasta for dinner.  Things intensified and worsened.  Worsening nausea.  Vomiting.  Because of the severe disc pain and discomfort, she went to the emergency room.  She went to [facility].  Exam concerning for Murphy sign.  Ultrasound showed large gallstone.  No definite thickening or pericholecystic fluid but persistent symptoms concerning.  Surgical consultation recommended.  Patient denies any heartburn or reflux.  She tried some over-the-counter medications without much relief.  No personal nor family history of GI/colon cancer, inflammatory bowel disease, irritable bowel syndrome, allergy such as Celiac Sprue, dietary/dairy problems, colitis, ulcers nor gastritis.  No recent sick contacts/gastroenteritis.  No travel outside the country.  No changes in diet.  No dysphagia to solids or liquids.  No significant heartburn or reflux.  No hematochezia, hematemesis, coffee ground emesis.  No evidence of prior gastric/peptic ulceration.   No exertional chest/neck/shoulder/arm pain.  Patient can  walk 180 minutes for about 5 miles without difficulty.      Assessment  Teresa Wong  42 y.o. female    Problem List:  Principal Problem:   Acute calculous cholecystitis Active Problems:   Obesity (BMI 30-39.9)   Elevated LDL cholesterol level   Transverse myelitis (HCC)   Hypercholesteremia   Hypokalemia   Severe biliary colic episode concerning for acute on chronic cholecystitis  Plan:  Discussed options with her but given the severity of the tach, consider admission.  She is leaning towards that.  IV fluids for nausea vomiting.  Hypokalemia correction.  Check MAg  IV antibiotics.  Laparoscopic cholecystectomy this admission.  Try single site versus standard 4 port technique:  The anatomy & physiology of hepatobiliary & pancreatic function was discussed.  The pathophysiology of gallbladder dysfunction was discussed.  Natural history risks without surgery was discussed.   I feel the risks of no intervention will lead to serious problems that outweigh the operative risks; therefore, I recommended cholecystectomy to remove the pathology.  I explained laparoscopic techniques with possible need for an open approach.  Probable cholangiogram to evaluate the bilary tract was explained as well.    Risks such as bleeding, infection, abscess, leak, injury to other organs, need for repair of tissues / organs, need for further treatment, stroke, heart attack, death, and other risks were discussed.  I noted a good likelihood this will help address the problem.  Possibility that this will not correct all abdominal symptoms was explained.  Goals of post-operative recovery were discussed as well.  We will work to minimize complications.  An educational handout further explaining the pathology and treatment options was  given as well.  Questions were answered.  The patient & family express understanding & wish to proceed with surgery.  VTE prophylaxis- SCDs, etc mobilize as tolerated to help  recovery  30 minutes spent in review, evaluation, examination, counseling, and coordination of care.  More than 50% of that time was spent in counseling.  Adin Hector, M.D., F.A.C.S. Gastrointestinal and Minimally Invasive Surgery [facility] [address], [place], [place] [address] [phone] Main / Paging   09/28/2017      Past Medical History:  Diagnosis Date  . Abnormal Pap smear   . 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  . 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 History of LEEP (loop electrosurgical excision procedure) of cervix complicating pregnancy 6270  . Meningitis 1984  . Postpartum depression   . Pregnancy induced hypertension 2010, 2012   History - resolved - no problems with current pregnancy 2015    Past Surgical History:  Procedure Laterality Date  . CERVICAL BIOPSY  W/ LOOP ELECTRODE EXCISION  2006  . CESAREAN SECTION N/A 11/26/2013   Procedure: CESAREAN SECTION;  Surgeon: Daria Pastures, MD;  Location: [facility];  Service: Obstetrics;  Laterality: N/A;  . COLPOSCOPY  09/2004  . DILATION AND CURETTAGE OF UTERUS  11/2011,08/2010,12/2007,08/2007   x4  . DILATION AND EVACUATION  12/18/2011   Procedure: DILATATION AND EVACUATION;  Surgeon: Daria Pastures, MD;  Location: [facility];  Service: Gynecology;  Laterality: N/A;  . TUBAL LIGATION Bilateral 11/26/2013   Procedure: BILATERAL TUBAL LIGATION;  Surgeon: Daria Pastures, MD;  Location: [facility];  Service: Obstetrics;  Laterality: Bilateral;  . WISDOM TOOTH EXTRACTION      Social History   Socioeconomic History  . Marital status: Married    Spouse name: Not on file  . Number of children: 2  . Years of education: some colle  . Highest education level: Not on file  Social Needs  . Financial resource strain: Not on file  . Food insecurity - worry: Not on file  . Food insecurity - inability: Not on file  . Transportation needs -  medical: Not on file  . Transportation needs - non-medical: Not on file  Occupational History  . Not on file  Tobacco Use  . Smoking status: Never Smoker  . Smokeless tobacco: Never Used  Substance and Sexual Activity  . Alcohol use: No  . Drug use: No  . Sexual activity: Yes    Birth control/protection: Surgical    Comment: pregnant   Other Topics Concern  . Not on file  Social History Narrative   [name] lives with her husband & 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    Family History  Problem Relation Age of Onset  . Hypertension Mother   . Hyperlipidemia Mother   . CAD Father 61       Bypass surgery 1.5 yrs ago   . Hyperlipidemia Father   . Cancer Father        bladder  . Bladder Cancer Father   . Diverticulitis Father   . Heart disease Maternal Grandfather   . Colon cancer Maternal Grandmother   . Cancer Maternal Grandmother        colon  . Miscarriages / Stillbirths Paternal Grandmother     Current Facility-Administered Medications  Medication Dose Route Frequency Provider Last Rate Last Dose  . acetaminophen (TYLENOL) tablet  650 mg  650 mg Oral Q6H PRN Michael Boston, MD       Or  . acetaminophen (TYLENOL) suppository 650 mg  650 mg Rectal Q6H PRN Michael Boston, MD      . acetaminophen (TYLENOL) tablet 1,000 mg  1,000 mg Oral On Call to OR Michael Boston, MD      . alum & mag hydroxide-simeth (MAALOX/MYLANTA) 200-200-20 MG/5ML suspension 30 mL  30 mL Oral Q6H PRN Michael Boston, MD      . bisacodyl (DULCOLAX) suppository 10 mg  10 mg Rectal Q12H PRN Michael Boston, MD      . cefTRIAXone (ROCEPHIN) 2 g in sodium chloride 0.9 % 100 mL IVPB  2 g Intravenous On Call to OR Michael Boston, MD       And  . metroNIDAZOLE (FLAGYL) IVPB 500 mg  500 mg Intravenous On Call to OR Michael Boston, MD      . cefTRIAXone (ROCEPHIN) 2 g in sodium chloride 0.9 % 100 mL IVPB  2 g Intravenous Q24H Michael Boston, MD      . celecoxib (CELEBREX) capsule 200 mg  200 mg Oral On Call to OR  Michael Boston, MD      . Chlorhexidine Gluconate Cloth 2 % PADS 6 each  6 each Topical Once Michael Boston, MD       And  . Chlorhexidine Gluconate Cloth 2 % PADS 6 each  6 each Topical Once Michael Boston, MD      . diphenhydrAMINE (BENADRYL) 12.5 MG/5ML elixir 12.5 mg  12.5 mg Oral Q6H PRN Michael Boston, MD       Or  . diphenhydrAMINE (BENADRYL) injection 12.5 mg  12.5 mg Intravenous Q6H PRN Michael Boston, MD      . enoxaparin (LOVENOX) injection 40 mg  40 mg Subcutaneous Q24H Michael Boston, MD      . gabapentin (NEURONTIN) capsule 300 mg  300 mg Oral On Call to OR Michael Boston, MD      . gabapentin (NEURONTIN) capsule 300 mg  300 mg Oral BID Michael Boston, MD      . guaiFENesin-dextromethorphan (ROBITUSSIN DM) 100-10 MG/5ML syrup 10 mL  10 mL Oral Q4H PRN Michael Boston, MD      . hydrocortisone (ANUSOL-HC) 2.5 % rectal cream 1 application  1 application Topical QID PRN Michael Boston, MD      . hydrocortisone cream 1 % 1 application  1 application Topical TID PRN Michael Boston, MD      . HYDROmorphone (DILAUDID) injection 0.5-2 mg  0.5-2 mg Intravenous Q2H PRN Michael Boston, MD      . lactated ringers bolus 1,000 mL  1,000 mL Intravenous Q8H PRN Mattson Dayal, Remo Lipps, MD      . lactated ringers bolus 1,000 mL  1,000 mL Intravenous Once Michael Boston, MD      . lactated ringers infusion   Intravenous Continuous Michael Boston, MD      . lip balm (CARMEX) ointment 1 application  1 application Topical BID Michael Boston, MD      . magic mouthwash  15 mL Oral QID PRN Michael Boston, MD      . menthol-cetylpyridinium (CEPACOL) lozenge 3 mg  1 lozenge Oral PRN Michael Boston, MD      . methocarbamol (ROBAXIN) 1,000 mg in dextrose 5 % 50 mL IVPB  1,000 mg Intravenous Q6H PRN Michael Boston, MD      . metoCLOPramide (REGLAN) injection 10 mg  10 mg Intravenous Q6H PRN Michael Boston, MD      .  ondansetron (ZOFRAN-ODT) disintegrating tablet 4 mg  4 mg Oral Q6H PRN Michael Boston, MD       Or  . ondansetron (ZOFRAN)  injection 4 mg  4 mg Intravenous Q6H PRN Michael Boston, MD      . phenol (CHLORASEPTIC) mouth spray 1-2 spray  1-2 spray Mouth/Throat PRN Michael Boston, MD      . potassium chloride 10 mEq in 100 mL IVPB  10 mEq Intravenous Q1 Hr x 4 Quintella Reichert, MD 100 mL/hr at 09/28/17 0348 10 mEq at 09/28/17 0348  . potassium chloride 10 mEq in 100 mL IVPB  10 mEq Intravenous Q1 Hr x 4 Tagan Bartram, Remo Lipps, MD      . potassium chloride SA (K-DUR,KLOR-CON) CR tablet 40 mEq  40 mEq Oral BID Michael Boston, MD      . prochlorperazine (COMPAZINE) injection 5-10 mg  5-10 mg Intravenous Q4H PRN Michael Boston, MD      . simethicone (MYLICON) chewable tablet 40 mg  40 mg Oral Q6H PRN Michael Boston, MD       Current Outpatient Medications  Medication Sig Dispense Refill  . cholecalciferol (VITAMIN D) 400 units TABS tablet Take 800 Units by mouth daily.    . Multiple Vitamin (MULTIVITAMIN) tablet Take 1 tablet by mouth daily.    . Vitamin D, Ergocalciferol, (DRISDOL) 50000 units CAPS capsule Take 1 capsule (50,000 Units total) by mouth every 7 (seven) days. 12 capsule 1   Facility-Administered Medications Ordered in Other Encounters  Medication Dose Route Frequency Provider Last Rate Last Dose  . gadopentetate dimeglumine (MAGNEVIST) injection 20 mL  20 mL Intravenous Once PRN Sater, Richard A, MD      . gadopentetate dimeglumine (MAGNEVIST) injection 20 mL  20 mL Intravenous Once PRN Sater, Nanine Means, MD         No Known Allergies  ROS:   All other systems reviewed & are negative except per HPI or as noted below: Constitutional:  No fevers, chills, sweats.  Weight stable Eyes:  No vision changes, No discharge HENT:  No sore throats, nasal drainage Lymph: No neck swelling, No bruising easily Pulmonary:  No cough, productive sputum CV: No orthopnea, PND  Patient walks 120 minutes for about 2 miles without difficulty.  No exertional chest/neck/shoulder/arm pain. GI:  No personal nor family history of GI/colon cancer,  inflammatory bowel disease, irritable bowel syndrome, allergy such as Celiac Sprue, dietary/dairy problems, colitis, ulcers nor gastritis.  No recent sick contacts/gastroenteritis.  No travel outside the country.  No changes in diet. Renal: No UTIs, No hematuria Genital:  No drainage, bleeding, masses Musculoskeletal: No severe joint pain.  Good ROM major joints Skin:  No sores or lesions.  No rashes Heme/Lymph:  No easy bleeding.  No swollen lymph nodes Neuro: No focal weakness/numbness.  No seizures Psych: No suicidal ideation.  No hallucinations  BP 121/69   Pulse 80   Temp 98.5 F (36.9 C) (Oral)   Resp 16   Wt 95.3 kg (210 lb)   LMP 09/01/2017   SpO2 98%   BMI 31.93 kg/m   Physical Exam: General: Pt awake/alert/oriented x4 in no major acute distress Eyes: PERRL, normal EOM. Sclera nonicteric Neuro: CN II-XII intact w/o focal sensory/motor deficits. Lymph: No head/neck/groin lymphadenopathy Psych:  No delerium/psychosis/paranoia HENT: Normocephalic, Mucus membranes moist.  No thrush Neck: Supple, No tracheal deviation Chest: No pain.  Good respiratory excursion. CV:  Pulses intact.  Regular rhythm Abdomen: Obese.  Soft, Nondistended.  Discomfort right subcostal ridge  but no major Murphy sign.  The rest of the abdomen is nontender.  No incarcerated hernias. Gen:  No inguinal hernias.  No inguinal lymphadenopathy.   Ext:  SCDs BLE.  No significant edema.  No cyanosis Skin: No petechiae / purpurea.  No major sores Musculoskeletal: No severe joint pain.  Good ROM major joints   Results:   Labs: Results for orders placed or performed during the hospital encounter of 09/27/17 (from the past 48 hour(s))  Pregnancy, urine     Status: None   Collection Time: 09/27/17  9:58 PM  Result Value Ref Range   Preg Test, Ur NEGATIVE NEGATIVE    Comment:        THE SENSITIVITY OF THIS METHODOLOGY IS >20 mIU/mL. Performed at [facility], [address] [place],  [place] [address]   CBC     Status: Abnormal   Collection Time: 09/27/17 10:35 PM  Result Value Ref Range   WBC 11.3 (H) 4.0 - 10.5 K/uL   RBC 4.42 3.87 - 5.11 MIL/uL   Hemoglobin 12.9 12.0 - 15.0 g/dL   HCT 37.0 36.0 - 46.0 %   MCV 83.7 78.0 - 100.0 fL   MCH 29.2 26.0 - 34.0 pg   MCHC 34.9 30.0 - 36.0 g/dL   RDW 13.2 11.5 - 15.5 %   Platelets 394 150 - 400 K/uL    Comment: Performed at [facility], [address] [place], [place] [address]  Troponin I     Status: None   Collection Time: 09/27/17 10:35 PM  Result Value Ref Range   Troponin I <0.03 <0.03 ng/mL    Comment: Performed at [facility], [address] [place], [place] [address]  Comprehensive metabolic panel     Status: Abnormal   Collection Time: 09/27/17 10:35 PM  Result Value Ref Range   Sodium 137 135 - 145 mmol/L   Potassium 3.1 (L) 3.5 - 5.1 mmol/L   Chloride 103 101 - 111 mmol/L   CO2 23 22 - 32 mmol/L   Glucose, Bld 118 (H) 65 - 99 mg/dL   BUN 9 6 - 20 mg/dL   Creatinine, Ser 0.53 0.44 - 1.00 mg/dL   Calcium 9.2 8.9 - 10.3 mg/dL   Total Protein 7.9 6.5 - 8.1 g/dL   Albumin 4.6 3.5 - 5.0 g/dL   AST 19 15 - 41 U/L   ALT 16 14 - 54 U/L   Alkaline Phosphatase 72 38 - 126 U/L   Total Bilirubin 0.1 (L) 0.3 - 1.2 mg/dL   GFR calc non Af Amer >60 >60 mL/min   GFR calc Af Amer >60 >60 mL/min    Comment: (NOTE) The eGFR has been calculated using the CKD EPI equation. This calculation has not been validated in all clinical situations. eGFR's persistently <60 mL/min signify possible Chronic Kidney Disease.    Anion gap 11 5 - 15    Comment: Performed at [facility], [address] [place], [place] [address]  Lipase, blood     Status: None   Collection Time: 09/27/17 10:35 PM  Result Value Ref Range   Lipase 28 11 - 51 U/L    Comment: Performed at [facility], [address] [place], [place] [address]    Imaging / Studies: Dg Chest 2 View  Result Date:  09/27/2017 CLINICAL DATA:  Acute onset chest pain under the right breast EXAM: CHEST  2 VIEW COMPARISON:  05/30/2015 FINDINGS: The heart size and mediastinal contours are within normal limits. Both lungs are clear. The visualized skeletal structures are unremarkable. IMPRESSION: No active cardiopulmonary disease. Electronically Signed   By: Donavan Foil M.D.   On: 09/27/2017 22:25   Mr Thoracic Spine W Wo Contrast  Result Date: 09/11/2017 [facility] [address] [place], [place] [address] [phone] NEUROIMAGING REPORT STUDY DATE: 09/10/2017 PATIENT NAME: Teresa Wong DOB: [birth year] MRN: [id number] EXAM: MRI of the thoracic spine with and without contrast ORDERING CLINICIAN: Richard A. Sater, MD. PhD CLINICAL HISTORY: 42 year old woman with transverse myelitis COMPARISON FILMS: MRI 05/28/2017 TECHNIQUE: MRI of the thoracic spine was obtained utilizing 3 mm sagittal slices from the posterior fossa from C7 to L1 level with T1, T2 and inversion recovery views. In addition 4 mm axial slices from S1X7 to L39Q3 level were included with T2 and gradient echo views.  After the infusion of contrast, additional T1-weighted images were performed. CONTRAST: 20 mL Magnevist IMAGING SITE: [facility], [address] [place], [place] [address] FINDINGS: :  On scout images, the spine is imaged from above the cervicomedullary junction to the lower thoracic spine.  On the sagittal images, there is a small focus in the anterior spinal cord adjacent to T8-T9 that is not readily apparent on the axial images. It was noted on the 05/28/2017 MRI. Additionally, there is mild hyperintense signal in the mid cervical spine adjacent to the T10 vertebral body and this was also present on the previous MRI but was more apparent at that time The vertebral bodies are normally aligned.   There is a hemangioma with benign characteristics within the T6 vertebral body.  Otherwise, the  vertebral  bodies have normal signal.  The discs and interspaces were further evaluated on axial views from C7 to L1.   There is a small disc protrusion at T7-T8, slightly more to the right and a small disc bulge at T10-T11. Additionally, on sagittal images only, is a disc protrusion at C5-C6.    This MRI of the thoracic spine with and without contrast shows the following: 1.    Chronic T2 hyperintense signal within the spinal cord anteriorly adjacent to T8-T9 and centrally adjacent to T10. Both foci were present on the 05/28/2017 but were more readily apparent on those images. 2.    Mild disc degenerative changes at C5-C6, T7-T8 and T10-T11.   The thoracic disc changes were noted on the previous MRI and the cervical disc protrusion was not included on the field of view on the 2018 MRI. INTERPRETING PHYSICIAN: Richard A. Felecia Shelling, MD, PhD, FAAN Certified in  Neuroimaging by [employer]   US Abdomen Limited Ruq  Result Date: 09/27/2017 CLINICAL DATA:  Severe postprandial epigastric pain radiating into the right upper quadrant with nausea and vomiting today. EXAM: ULTRASOUND ABDOMEN LIMITED RIGHT UPPER QUADRANT COMPARISON:  None. FINDINGS: Gallbladder: A stone impacted in the gallbladder neck measures 2.8 cm in diameter. There is some sludge within the gallbladder. Scattered foci of ring down artifact are also identified off the gallbladder wall consistent with adenomyomatosis. Sonographer reports positive Murphy's sign. Gallbladder wall thickness is normal at 0.3 cm. No pericholecystic fluid. Common bile duct: Diameter: 0.4 cm Liver: No focal lesion identified. Within normal limits in parenchymal echogenicity. Portal vein is patent on color Doppler imaging with normal direction of blood flow towards the liver. IMPRESSION: 2.8 cm stone impacted in the gallbladder neck and gallbladder sludge. Changes  of adenomyomatosis are identified. Sonographer reports positive Murphy's sign. No gallbladder wall  thickening or pericholecystic fluid. Electronically Signed   By: Inge Rise M.D.   On: 09/27/2017 23:34    Medications / Allergies: per chart  Antibiotics: Anti-infectives (From admission, onward)   Start     Dose/Rate Route Frequency Ordered Stop   09/28/17 0600  cefTRIAXone (ROCEPHIN) 2 g in sodium chloride 0.9 % 100 mL IVPB     2 g 200 mL/hr over 30 Minutes Intravenous On call to O.R. 09/28/17 0348 09/29/17 0559   09/28/17 0600  metroNIDAZOLE (FLAGYL) IVPB 500 mg     500 mg 100 mL/hr over 60 Minutes Intravenous On call to O.R. 09/28/17 0348 09/29/17 0559   09/28/17 0400  cefTRIAXone (ROCEPHIN) 2 g in sodium chloride 0.9 % 100 mL IVPB     2 g 200 mL/hr over 30 Minutes Intravenous Every 24 hours 09/28/17 0348     09/28/17 0030  cefTRIAXone (ROCEPHIN) 2 g in sodium chloride 0.9 % 100 mL IVPB     2 g 200 mL/hr over 30 Minutes Intravenous  Once 09/28/17 0026 09/28/17 0121        Note: Portions of this report may have been transcribed using voice recognition software. Every effort was made to ensure accuracy; however, inadvertent computerized transcription errors may be present.   Any transcriptional errors that result from this process are unintentional.    Adin Hector, M.D., F.A.C.S. Gastrointestinal and Minimally Invasive Surgery [facility] [address] [place], [place] [address] [phone] Main / Paging   09/28/2017

## 2017-09-29 ENCOUNTER — Encounter (HOSPITAL_COMMUNITY): Payer: Self-pay | Admitting: Surgery

## 2017-09-29 LAB — HIV ANTIBODY (ROUTINE TESTING W REFLEX): HIV Screen 4th Generation wRfx: NONREACTIVE

## 2017-10-01 ENCOUNTER — Other Ambulatory Visit: Payer: Self-pay | Admitting: *Deleted

## 2017-10-01 NOTE — Patient Outreach (Addendum)
[facility]) Care Management  10/01/2017  Teresa Wong 09/02/1975 [id number]   Subjective: Telephone call to patient's home  / mobile number, no answer, message states mailbox full, and unable to leave a message.      Objective: Per KPN (Knowledge Performance Now, point of care tool) and chart review, patient hospitalized 09/27/17 - 09/28/17 for Acute calculous cholecystitis.  Status post Laparoscopic cholecystectomy with intraoperative cholangiogram on 09/28/17.  Patient also has a history of Transverse myelitis and hypercholesteremia.     Assessment:  Received [facility] Transition of care referral on 09/30/17.   Transition of care follow up pending patient contact.        Plan: RNCM will call patient for 2nd telephone outreach attempt, transition of care follow up, within 10 business days if no return call.       Fina Heizer H. Annia Friendly, BSN, [place] [facility] Telephonic CM Phone: [phone] Fax: [phone]

## 2017-10-02 ENCOUNTER — Other Ambulatory Visit: Payer: Self-pay | Admitting: *Deleted

## 2017-10-02 ENCOUNTER — Ambulatory Visit: Payer: Self-pay | Admitting: *Deleted

## 2017-10-02 ENCOUNTER — Encounter: Payer: Self-pay | Admitting: *Deleted

## 2017-10-02 NOTE — Patient Outreach (Signed)
[name]) Care Management  10/02/2017  Teresa Wong May 05, 1976 [id number]   Subjective: Telephone call to patient's home  / mobile number, no answer, left HIPAA compliant voicemail message, and requested call back.     Objective: Per KPN (Knowledge Performance Now, point of care tool) and chart review, patient hospitalized 09/27/17 - 09/28/17 for Acute calculous cholecystitis.  Status post Laparoscopic cholecystectomy with intraoperative cholangiogram on 09/28/17.  Patient also has a history of Transverse myelitis and hypercholesteremia.     Assessment:  Received UMR Transition of care referral on 09/30/17.   Transition of care follow up pending patient contact.        Plan: RNCM will send unsuccessful outreach  letter, [facility] pamphlet, will call patient for 3rd telephone outreach attempt, transition of care follow up, and proceed with case closure, within 10 business days if no return call.      Therin Vetsch H. Annia Friendly, BSN, [place] [facility] Telephonic CM Phone: [phone] Fax: [phone]

## 2017-10-05 NOTE — Discharge Summary (Signed)
Physician Discharge Summary  Patient ID: Teresa Wong MRN: [id number] DOB/AGE: April 27, 1976 42 y.o.  Admit date: 09/27/2017 Discharge date: 09/28/2017  Admission Diagnoses:  Acute on chronic calculus cholecystitis  Discharge Diagnoses:  Acute on chronic calculus cholecystitis Liver: Fatty steatohepatitis    Principal Problem:   Acute on chronic cholecystitis s/p lap cholecystectomy 09/28/2017 Active Problems:   Obesity (BMI 30-39.9)   Elevated LDL cholesterol level   Transverse myelitis (HCC)   Hypercholesteremia   Hypokalemia   PROCEDURES:   SINGLE SITE Laparoscopic cholecystectomy with intraoperative cholangiogram, 09/28/17 SURGEON:  Adin Hector, MD, [facility] Course:  Pleasant obese female with episode of severe upper abdominal pain radiating to her right side and back.  Triggered early afternoon.  She had a [place] sandwich for lunch.  She then had some pasta for dinner.  Things intensified and worsened.  Worsening nausea.  Vomiting.  Because of the severe disc pain and discomfort, she went to the emergency room.  She went to [facility].  Exam concerning for Murphy sign.  Ultrasound showed large gallstone.  No definite thickening or pericholecystic fluid but persistent symptoms concerning.  Surgical consultation recommended.  Patient denies any heartburn or reflux.  She tried some over-the-counter medications without much relief.  No personal nor family history of GI/colon cancer, inflammatory bowel disease, irritable bowel syndrome, allergy such as Celiac Sprue, dietary/dairy problems, colitis, ulcers nor gastritis.  No recent sick contacts/gastroenteritis.  No travel outside the country.  No changes in diet.  No dysphagia to solids or liquids.  No significant heartburn or reflux.  No hematochezia, hematemesis, coffee ground emesis.  No evidence of prior gastric/peptic ulceration.   No exertional chest/neck/shoulder/arm pain.  Patient can walk 180 minutes for  about 5 miles without difficulty.  Patient was seen in the ED by Dr. gross.  She was treated with IV fluids for nausea and vomiting, hypokalemia was corrected, she was started on IV antibiotics is recommended she undergo laparoscopic cholecystectomy. Patient was taken the operating room later that day and underwent single site laparoscopic cholecystectomy with intraoperative cholangiogram.  This was completed by Dr. Johney Maine.  She was found to have dense omental chronic adhesions to most of the gallbladder strongly suspicious for chronic cholecystitis.  She tolerated the procedure well.  Her diet was advanced and she was discharged home later that evening after surgery. Follow-up as listed below.  CBC Latest Ref Rng & Units 09/27/2017 05/20/2017 11/26/2014  WBC 4.0 - 10.5 K/uL 11.3(H) 7.4 5.5  Hemoglobin 12.0 - 15.0 g/dL 12.9 13.0 13.3  Hematocrit 36.0 - 46.0 % 37.0 37.5 39.2  Platelets 150 - 400 K/uL 394 370 342.0   CMP Latest Ref Rng & Units 09/27/2017 05/20/2017 11/26/2014  Glucose 65 - 99 mg/dL 118(H) 101(H) 88  BUN 6 - 20 mg/dL 9 7 8   Creatinine 0.44 - 1.00 mg/dL 0.53 0.53 0.53  Sodium 135 - 145 mmol/L 137 139 138  Potassium 3.5 - 5.1 mmol/L 3.1(L) 4.3 4.1  Chloride 101 - 111 mmol/L 103 102 103  CO2 22 - 32 mmol/L 23 23 29   Calcium 8.9 - 10.3 mg/dL 9.2 9.3 9.3  Total Protein 6.5 - 8.1 g/dL 7.9 - 6.8  Total Bilirubin 0.3 - 1.2 mg/dL 0.1(L) - 0.5  Alkaline Phos 38 - 126 U/L 72 - 67  AST 15 - 41 U/L 19 - 12  ALT 14 - 54 U/L 16 - 12   I did not see take part in  the patient's care.  Dictation is from the patient's chart.  Disposition: 01-Home or Self Care  Discharge Instructions    Call MD for:   Complete by:  As directed    FEVER > 101.5 F  (temperatures < 101.5 F are not significant)   Call MD for:  extreme fatigue   Complete by:  As directed    Call MD for:  persistant dizziness or light-headedness   Complete by:  As directed    Call MD for:  persistant nausea and vomiting   Complete  by:  As directed    Call MD for:  redness, tenderness, or signs of infection (pain, swelling, redness, odor or green/yellow discharge around incision site)   Complete by:  As directed    Call MD for:  severe uncontrolled pain   Complete by:  As directed    Diet - low sodium heart healthy   Complete by:  As directed    Start with a bland diet such as soups, liquids, starchy foods, low fat foods, etc. the first few days at home. Gradually advance to a solid, low-fat, high fiber diet by the end of the first week at home.   Add a fiber supplement to your diet (Metamucil, etc) If you feel full, bloated, or constipated, stay on a full liquid or pureed/blenderized diet for a few days until you feel better and are no longer constipated.   Discharge instructions   Complete by:  As directed    See Discharge Instructions If you are not getting better after two weeks or are noticing you are getting worse, contact our office [phone] for further advice.  We may need to adjust your medications, re-evaluate you in the office, send you to the emergency room, or see what other things we can do to help. The clinic staff is available to answer your questions during regular business hours (8:30am-5pm).  Please don't hesitate to call and ask to speak to one of our nurses for clinical concerns.    A surgeon from [facility] is always on call at the hospitals 24 hours/day If you have a medical emergency, go to the nearest emergency room or call 911.   Discharge wound care:   Complete by:  As directed    It is good for closed incision and even open wounds to be washed every day.  Shower every day.  Short baths are fine.  Wash the incisions and wounds clean with soap & water.    If you have a closed incision(s), wash the incision with soap & water every day.  You may leave closed incisions open to air if it is dry.   You may cover the incision with clean gauze & replace it after your daily shower for  comfort. If you have skin tapes (Steristrips) or skin glue (Dermabond) on your incision, leave them in place.  They will fall off on their own like a scab.  You may trim any edges that curl up with clean scissors.  If you have staples, set up an appointment for them to be removed in the office in 10 days after surgery.  If you have a drain, wash around the skin exit site with soap & water and place a new dressing of gauze or band aid around the skin every day.  Keep the drain site clean & dry.   Driving Restrictions   Complete by:  As directed    You may drive when: - you  are no longer taking narcotic prescription pain medication - you can comfortably wear a seatbelt - you can safely make sudden turns/stops without pain.   Increase activity slowly   Complete by:  As directed    Start light daily activities --- self-care, walking, climbing stairs- beginning the day after surgery.  Gradually increase activities as tolerated.  Control your pain to be active.  Stop when you are tired.  Ideally, walk several times a day, eventually an hour a day.   Most people are back to most day-to-day activities in a few weeks.  It takes 4-6 weeks to get back to unrestricted, intense activity. If you can walk 30 minutes without difficulty, it is safe to try more intense activity such as jogging, treadmill, bicycling, low-impact aerobics, swimming, etc. Save the most intensive and strenuous activity for last (Usually 4-8 weeks after surgery) such as sit-ups, heavy lifting, contact sports, etc.  Refrain from any intense heavy lifting or straining until you are off narcotics for pain control.  You will have off days, but things should improve week-by-week. DO NOT PUSH THROUGH PAIN.  Let pain be your guide: If it hurts to do something, don't do it.   Lifting restrictions   Complete by:  As directed    If you can walk 30 minutes without difficulty, it is safe to try more intense activity such as jogging, treadmill,  bicycling, low-impact aerobics, swimming, etc. Save the most intensive and strenuous activity for last (Usually 4-8 weeks after surgery) such as sit-ups, heavy lifting, contact sports, etc.   Refrain from any intense heavy lifting or straining until you are off narcotics for pain control.  You will have off days, but things should improve week-by-week. DO NOT PUSH THROUGH PAIN.  Let pain be your guide: If it hurts to do something, don't do it.  Pain is your body warning you to avoid that activity for another week until the pain goes down.   May shower / Bathe   Complete by:  As directed    May walk up steps   Complete by:  As directed    Sexual Activity Restrictions   Complete by:  As directed    You may have sexual intercourse when it is comfortable. If it hurts to do something, stop.     Allergies as of 09/28/2017   No Known Allergies     Medication List    TAKE these medications   cholecalciferol 400 units Tabs tablet Commonly known as:  VITAMIN D Take 800 Units by mouth daily.   multivitamin tablet Take 1 tablet by mouth daily.   naproxen 500 MG tablet Commonly known as:  NAPROSYN Take 1 tablet (500 mg total) by mouth every 12 (twelve) hours as needed for mild pain or moderate pain.   traMADol 50 MG tablet Commonly known as:  ULTRAM Take 1-2 tablets (50-100 mg total) by mouth every 6 (six) hours as needed for moderate pain or severe pain.   Vitamin D (Ergocalciferol) 50000 units Caps capsule Commonly known as:  DRISDOL Take 1 capsule (50,000 Units total) by mouth every 7 (seven) days.            Discharge Care Instructions  (From admission, onward)        Start     Ordered   09/28/17 0000  Discharge wound care:    Comments:  It is good for closed incision and even open wounds to be washed every day.  Shower every day.  Short baths are fine.  Wash the incisions and wounds clean with soap & water.    If you have a closed incision(s), wash the incision with soap &  water every day.  You may leave closed incisions open to air if it is dry.   You may cover the incision with clean gauze & replace it after your daily shower for comfort. If you have skin tapes (Steristrips) or skin glue (Dermabond) on your incision, leave them in place.  They will fall off on their own like a scab.  You may trim any edges that curl up with clean scissors.  If you have staples, set up an appointment for them to be removed in the office in 10 days after surgery.  If you have a drain, wash around the skin exit site with soap & water and place a new dressing of gauze or band aid around the skin every day.  Keep the drain site clean & dry.   09/28/17 [facility] Schedule an appointment as soon as possible for a visit in 3 weeks.   Specialty:  General Surgery Why:  To follow up after your operation, To follow up after your hospital stay Contact information: [address] [place] [phone]          Signed: Earnstine Regal 10/05/2017, 11:22 AM

## 2017-10-15 ENCOUNTER — Encounter: Payer: Self-pay | Admitting: *Deleted

## 2017-10-15 ENCOUNTER — Ambulatory Visit: Payer: Self-pay | Admitting: *Deleted

## 2017-10-15 ENCOUNTER — Other Ambulatory Visit: Payer: Self-pay | Admitting: *Deleted

## 2017-10-15 NOTE — Patient Outreach (Signed)
[facility]) Care Management  10/15/2017  ELLEN GORIS 06-02-1976 [id number]   Subjective: Telephone call to patient's home / mobile number, spoke with patient, and HIPAA verified.  Discussed [facility] Care Management [facility] Transition of care follow up, patient voiced understanding, and is in agreement to follow up.  Patient states she is doing great, has returned to work, had follow up appointment with surgeon, and appointment went well. Patient states she is able to manage self care and has assistance as needed. Patient voices understanding of medical diagnosis, surgery, and treatment plan. States she is accessing the following [facility] benefits: outpatient pharmacy, hospital indemnity (not sure if chosen, will verify, file claim if appropriate), and no family medical leave act (FMLA) needed.  Patient states she does not have any education material, transition of care, care coordination, disease management, disease monitoring, transportation, community resource, or pharmacy needs at this time.  States she is very appreciative of the follow up and is in agreement to receive [facility] Management information.     Objective:Per KPN (Knowledge Performance Now, point of care tool) and chart review,patient hospitalized 09/27/17 - 09/28/17 forAcute calculous cholecystitis.Status postLaparoscopic cholecystectomy with intraoperative cholangiogramon 09/28/17. Patient also has a history ofTransverse myelitis and hypercholesteremia.     Assessment: Received [facility] Transition of care referral on 09/30/17.Transition of care follow up completed, no care management needs, and will proceed with case closure.        Plan:RNCM will send patient successful outreach letter, [facility] pamphlet, and magnet. RNCM will send case closure due to follow up completed / no care management needs request to [facility].      Rayann Jolley H. Annia Friendly, BSN, [place]  Management [facility] Telephonic CM Phone: [phone] Fax: [phone]

## 2017-11-19 ENCOUNTER — Telehealth: Payer: Self-pay | Admitting: Neurology

## 2017-11-19 NOTE — Telephone Encounter (Signed)
MR Brain w/wo contrast Dr. Scot Jun UMR Auth: [place] Ref # [id number]. Patient is scheduled for 12/04/17 on the [facility] mobile unit.

## 2017-12-04 ENCOUNTER — Ambulatory Visit: Payer: 59

## 2017-12-04 DIAGNOSIS — G373 Acute transverse myelitis in demyelinating disease of central nervous system: Secondary | ICD-10-CM | POA: Diagnosis not present

## 2017-12-04 DIAGNOSIS — R200 Anesthesia of skin: Secondary | ICD-10-CM | POA: Diagnosis not present

## 2017-12-04 DIAGNOSIS — R202 Paresthesia of skin: Secondary | ICD-10-CM | POA: Diagnosis not present

## 2017-12-04 MED ORDER — GADOPENTETATE DIMEGLUMINE 469.01 MG/ML IV SOLN
20.0000 mL | Freq: Once | INTRAVENOUS | Status: AC | PRN
  Administered 2017-12-04: 20 mL via INTRAVENOUS

## 2017-12-05 ENCOUNTER — Telehealth: Payer: Self-pay | Admitting: *Deleted

## 2017-12-05 NOTE — Telephone Encounter (Signed)
Spoke with Anarie and reviewed below MRI report.  She verbalized understanding of same/fim

## 2017-12-05 NOTE — Telephone Encounter (Signed)
-----   Message from Britt Bottom, MD sent at 12/04/2017  6:26 PM EDT ----- Please let her know that the MRI is normal.

## 2017-12-15 ENCOUNTER — Telehealth: Payer: 59 | Admitting: Physician Assistant

## 2017-12-15 DIAGNOSIS — R102 Pelvic and perineal pain: Secondary | ICD-10-CM

## 2017-12-15 DIAGNOSIS — N941 Unspecified dyspareunia: Secondary | ICD-10-CM

## 2017-12-15 NOTE — Progress Notes (Signed)
Based on what you shared with me it looks like you have a condition that should be evaluated in a face to face office visit. Giving residual symptoms despite diflucan use, and your mention of pain with intercourse, you need an in person assessment and pelvic examination to determine cause of symptoms and to obtain the most appropriate treatment. a NOTE: If you entered your credit card information for this eVisit, you will not be charged. You may see a "hold" on your card for the $30 but that hold will drop off and you will not have a charge processed.  If you are having a true medical emergency please call 911.  If you need an urgent face to face visit, [facility] has four urgent care centers for your convenience.  If you need care fast and have a high deductible or no insurance consider:   [email or web address] to reserve your spot online an avoid wait times  [facility] [address], [address] [place], [place] [address] 8 am to 8 pm Monday-Friday 10 am to 4 pm Saturday-Sunday *Across the street from [employer]  [place], [address] 8 am to 5 pm Monday-Friday * In the [facility] on the [facility]   The following sites will take your  insurance:  . [facility] [place] a Provider at this Location  [address] [place], [place] [address] . 10 am to 8 pm Monday-Friday . 12 pm to 8 pm Saturday-Sunday   . [facility] at [place] a Provider at this Location  [place], [place], [place] [address] . 8 am to 8 pm Monday-Friday . 9 am to 6 pm Saturday . 11 am to 6 pm Sunday   . [facility] at [place] Get Driving Directions  [address] Suite [place], [place] [address] . 8 am to 8 pm Monday-Friday . 8 am to 4 pm  Saturday-Sunday   Your e-visit answers were reviewed by a board certified advanced clinical practitioner to complete your personal care plan.  Thank you for using e-Visits.

## 2017-12-25 ENCOUNTER — Telehealth: Payer: Self-pay | Admitting: Family Medicine

## 2017-12-25 ENCOUNTER — Ambulatory Visit (INDEPENDENT_AMBULATORY_CARE_PROVIDER_SITE_OTHER): Payer: 59 | Admitting: Family Medicine

## 2017-12-25 ENCOUNTER — Encounter: Payer: Self-pay | Admitting: Family Medicine

## 2017-12-25 VITALS — BP 133/85 | HR 88 | Temp 98.3°F | Resp 20 | Ht 68.0 in | Wt 227.0 lb

## 2017-12-25 DIAGNOSIS — E669 Obesity, unspecified: Secondary | ICD-10-CM | POA: Diagnosis not present

## 2017-12-25 DIAGNOSIS — E7800 Pure hypercholesterolemia, unspecified: Secondary | ICD-10-CM

## 2017-12-25 DIAGNOSIS — E559 Vitamin D deficiency, unspecified: Secondary | ICD-10-CM

## 2017-12-25 DIAGNOSIS — Z79899 Other long term (current) drug therapy: Secondary | ICD-10-CM | POA: Diagnosis not present

## 2017-12-25 DIAGNOSIS — F339 Major depressive disorder, recurrent, unspecified: Secondary | ICD-10-CM

## 2017-12-25 DIAGNOSIS — E538 Deficiency of other specified B group vitamins: Secondary | ICD-10-CM | POA: Diagnosis not present

## 2017-12-25 DIAGNOSIS — Z0001 Encounter for general adult medical examination with abnormal findings: Secondary | ICD-10-CM

## 2017-12-25 HISTORY — DX: Major depressive disorder, recurrent, unspecified: F33.9

## 2017-12-25 LAB — CBC WITH DIFFERENTIAL/PLATELET
BASOS PCT: 0.2 % (ref 0.0–3.0)
Basophils Absolute: 0 10*3/uL (ref 0.0–0.1)
EOS ABS: 0.2 10*3/uL (ref 0.0–0.7)
Eosinophils Relative: 3.8 % (ref 0.0–5.0)
HEMATOCRIT: 38.2 % (ref 36.0–46.0)
Hemoglobin: 13 g/dL (ref 12.0–15.0)
LYMPHS PCT: 36.4 % (ref 12.0–46.0)
Lymphs Abs: 2.2 10*3/uL (ref 0.7–4.0)
MCHC: 33.9 g/dL (ref 30.0–36.0)
MCV: 85.3 fl (ref 78.0–100.0)
MONOS PCT: 6.8 % (ref 3.0–12.0)
Monocytes Absolute: 0.4 10*3/uL (ref 0.1–1.0)
NEUTROS ABS: 3.2 10*3/uL (ref 1.4–7.7)
Neutrophils Relative %: 52.8 % (ref 43.0–77.0)
PLATELETS: 364 10*3/uL (ref 150.0–400.0)
RBC: 4.48 Mil/uL (ref 3.87–5.11)
RDW: 14.5 % (ref 11.5–15.5)
WBC: 6 10*3/uL (ref 4.0–10.5)

## 2017-12-25 LAB — LIPID PANEL
CHOL/HDL RATIO: 4
Cholesterol: 201 mg/dL — ABNORMAL HIGH (ref 0–200)
HDL: 50 mg/dL (ref >39.00)
LDL CALC: 133 mg/dL — AB (ref 0–99)
NonHDL: 151.34
TRIGLYCERIDES: 92 mg/dL (ref 0.0–149.0)
VLDL: 18.4 mg/dL (ref 0.0–40.0)

## 2017-12-25 LAB — COMPREHENSIVE METABOLIC PANEL
ALT: 11 U/L (ref 0–35)
AST: 11 U/L (ref 0–37)
Albumin: 4.4 g/dL (ref 3.5–5.2)
Alkaline Phosphatase: 62 U/L (ref 39–117)
BUN: 11 mg/dL (ref 6–23)
CALCIUM: 9.2 mg/dL (ref 8.4–10.5)
CHLORIDE: 103 meq/L (ref 96–112)
CO2: 26 meq/L (ref 19–32)
Creatinine, Ser: 0.52 mg/dL (ref 0.40–1.20)
GFR: 139.09 mL/min (ref >60.00)
Glucose, Bld: 88 mg/dL (ref 70–99)
Potassium: 4 mEq/L (ref 3.5–5.1)
Sodium: 138 mEq/L (ref 135–145)
Total Bilirubin: 0.5 mg/dL (ref 0.2–1.2)
Total Protein: 7.1 g/dL (ref 6.0–8.3)

## 2017-12-25 LAB — TSH: TSH: 1.22 u[IU]/mL (ref 0.35–4.50)

## 2017-12-25 LAB — VITAMIN D 25 HYDROXY (VIT D DEFICIENCY, FRACTURES): VITD: 17.38 ng/mL — ABNORMAL LOW (ref 30.00–100.00)

## 2017-12-25 LAB — HEMOGLOBIN A1C: Hgb A1c MFr Bld: 5.4 % (ref 4.6–6.5)

## 2017-12-25 LAB — T4, FREE: Free T4: 0.72 ng/dL (ref 0.60–1.60)

## 2017-12-25 LAB — VITAMIN B12: Vitamin B-12: 515 pg/mL (ref 211–911)

## 2017-12-25 MED ORDER — VENLAFAXINE HCL ER 37.5 MG PO CP24: ORAL_CAPSULE | ORAL | 0 refills | Status: DC

## 2017-12-25 MED ORDER — VITAMIN D (ERGOCALCIFEROL) 1.25 MG (50000 UNIT) PO CAPS: ORAL_CAPSULE | ORAL | 0 refills | Status: DC

## 2017-12-25 MED FILL — VENLAFAXINE HCL ER 37.5 MG: 37.5 | 30 days supply | Qty: 53 | Fill #0

## 2017-12-25 NOTE — Progress Notes (Signed)
Patient ID: Teresa Wong, female  DOB: [birth year], 42 y.o.   MRN: [id number] Patient Care Team    Relationship Specialty Notifications Start End  Ma Hillock, DO PCP - General Family Medicine  03/04/15   Britt Bottom, MD Consulting Physician Neurology  09/28/17   Bobbye Charleston, MD Consulting Physician Obstetrics and Gynecology  12/25/17     Chief Complaint  Patient presents with  . Annual Exam    Subjective:  Teresa Wong is a 42 y.o.  Female  present for CPE. All past medical history, surgical history, allergies, family history, immunizations, medications and social history were updated in the electronic medical record today. All recent labs, ED visits and hospitalizations within the last year were reviewed.  Depression with anxiety: Patient reports her husband has noticed she has become "more down" as of lately.  She feels she has been struggling with her depression for about 2 years, and it has slowly been progressing over the last year.  She reports she is not as outgoing as she used to be.  She is having some difficulty surrounding her potential diagnosis of Teresa.  She feels she is sleeping okay.  She does always feel tired.  She has a history of vitamin B and vitamin D deficiencies.  She is over eating.  She is having trouble concentrating.  PHQ 9 completed today as a score 12.  Anxiety assessment with a score of 9.  She did suffer from postpartum depression in the past, and had been on Prozac.  Health maintenance:  Colonoscopy: Colon cancer MGM (56), mom w/screen at 82 non-cancerous polyps only found.  Mammogram: No fhx. Has appt w/ Dr. Philis Pique in August. Encouraged SBE Cervical cancer screening: last pap: 2014, completed by: Dr. Philis Pique. She has a PAP scheduled.  Immunizations: tdap UTD 2015, Influenza UTD 2018 (encouraged yearly) Infectious disease screening: HIV completed 2019 DEXA: N/A Assistive device: none Oxygen PTW:[facility] Patient has a Dental  home. Hospitalizations/ED visits: reviewed  Depression screen [facility] 2/9 12/25/2017 05/27/2017  Decreased Interest 2 0  Down, Depressed, Hopeless 2 0  PHQ - 2 Score 4 0  Altered sleeping 0 -  Tired, decreased energy 2 -  Change in appetite 3 -  Feeling bad or failure about yourself  1 -  Trouble concentrating 2 -  Moving slowly or fidgety/restless 0 -  Suicidal thoughts 0 -  PHQ-9 Score 12 -   GAD 7 : Generalized Anxiety Score 12/25/2017  Nervous, Anxious, on Edge 1  Control/stop worrying 2  Worry too much - different things 2  Trouble relaxing 2  Restless 0  Easily annoyed or irritable 2  Afraid - awful might happen 0  Total GAD 7 Score 9     Current Exercise Habits: The patient does not participate in regular exercise at present Exercise limited by: None identified   Immunization History  Administered Date(s) Administered  . Hepatitis B 11/24/2014, 03/04/2015  . Influenza-Unspecified 04/18/2017  . PPD Test 03/04/2015, 03/07/2015  . Tdap 11/26/2013  . Varicella 11/24/2014, 03/04/2015     Past Medical History:  Diagnosis Date  . Abnormal Pap smear   . 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  . 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 History of LEEP (loop electrosurgical excision procedure) of cervix complicating pregnancy 1275  . Meningitis 1984  . Postpartum depression   . Pregnancy induced hypertension 2010, 2012  History - resolved - no problems with current pregnancy 2015   No Known Allergies Past Surgical History:  Procedure Laterality Date  . CERVICAL BIOPSY  W/ LOOP ELECTRODE EXCISION  2006  . CESAREAN SECTION N/A 11/26/2013   Procedure: CESAREAN SECTION;  Surgeon: Daria Pastures, MD;  Location: [facility];  Service: Obstetrics;  Laterality: N/A;  . CHOLECYSTECTOMY    . COLPOSCOPY  09/2004  . DILATION AND CURETTAGE OF UTERUS  11/2011,08/2010,12/2007,08/2007   x4  . DILATION AND EVACUATION  12/18/2011   Procedure: DILATATION  AND EVACUATION;  Surgeon: Daria Pastures, MD;  Location: [facility];  Service: Gynecology;  Laterality: N/A;  . LAPAROSCOPIC CHOLECYSTECTOMY SINGLE PORT N/A 09/28/2017   Procedure: LAPAROSCOPIC CHOLECYSTECTOMY SINGLE SITE WITH CHOLANGIOGRAM;  Surgeon: Michael Boston, MD;  Location: [facility];  Service: General;  Laterality: N/A;  . TUBAL LIGATION Bilateral 11/26/2013   Procedure: BILATERAL TUBAL LIGATION;  Surgeon: Daria Pastures, MD;  Location: [facility];  Service: Obstetrics;  Laterality: Bilateral;  . WISDOM TOOTH EXTRACTION     Family History  Problem Relation Age of Onset  . Hypertension Mother   . Hyperlipidemia Mother   . CAD Father 65       Bypass surgery 1.5 yrs ago   . Hyperlipidemia Father   . Cancer Father        bladder  . Bladder Cancer Father   . Diverticulitis Father   . Heart disease Maternal Grandfather   . Colon cancer Maternal Grandmother 82  . Miscarriages / Stillbirths Paternal Grandmother    Social History   Socioeconomic History  . Marital status: Married    Spouse name: Not on file  . Number of children: 2  . Years of education: some colle  . Highest education level: Not on file  Occupational History  . Not on file  Social Needs  . Financial resource strain: Not on file  . Food insecurity:    Worry: Not on file    Inability: Not on file  . Transportation needs:    Medical: Not on file    Non-medical: Not on file  Tobacco Use  . Smoking status: Never Smoker  . Smokeless tobacco: Never Used  Substance and Sexual Activity  . Alcohol use: No  . Drug use: No  . Sexual activity: Yes    Birth control/protection: Surgical    Comment: pregnant   Lifestyle  . Physical activity:    Days per week: Not on file    Minutes per session: Not on file  . Stress: Not on file  Relationships  . Social connections:    Talks on phone: Not on file    Gets together: Not on file    Attends religious service: Not on file    Active member of club or organization: Not  on file    Attends meetings of clubs or organizations: Not on file    Relationship status: Not on file  . Intimate partner violence:    Fear of current or ex partner: Not on file    Emotionally abused: Not on file    Physically abused: Not on file    Forced sexual activity: Not on file  Other Topics Concern  . 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   Allergies as of 12/25/2017   No Known Allergies     Medication List  Accurate as of 12/25/17 11:59 PM. Always use your most recent med list.          cholecalciferol 400 units Tabs tablet Commonly known as:  VITAMIN D Take 800 Units by mouth daily.   cyanocobalamin 1000 MCG tablet Take 1,000 mcg by mouth daily.   multivitamin tablet Take 1 tablet by mouth daily.   venlafaxine XR 37.5 MG 24 hr capsule Commonly known as:  EFFEXOR XR Take 1 capsule (37.5 mg total) by mouth daily with breakfast for 7 days, THEN 2 capsules (75 mg total) daily with breakfast for 23 days. Start taking on:  12/25/2017   Vitamin D (Ergocalciferol) 50000 units Caps capsule Commonly known as:  DRISDOL 1 capsule twice a week.       All past medical history, surgical history, allergies, family history, immunizations andmedications were updated in the EMR today and reviewed under the history and medication portions of their EMR.      ROS: 14 pt review of systems performed and negative (unless mentioned in an HPI)  Objective: BP 133/85 (BP Location: Left Arm, Patient Position: Sitting, Cuff Size: Large)   Pulse 88   Temp 98.3 F (36.8 C)   Resp 20   Ht 5' 8"  (1.727 m)   Wt 227 lb (103 kg)   SpO2 99%   BMI 34.52 kg/m  Gen: Afebrile. No acute distress. Nontoxic in appearance, well-developed, well-nourished, pleasant, obese, Caucasian female. HENT: AT. [place]. Bilateral TM visualized and normal in appearance, normal external auditory canal. MMM, no oral lesions, adequate  dentition. Bilateral nares within normal limits. Throat without erythema, ulcerations or exudates.  No cough on exam, no hoarseness on exam. Eyes:Pupils Equal Round Reactive to light, Extraocular movements intact,  Conjunctiva without redness, discharge or icterus. Neck/lymp/endocrine: Supple, no lymphadenopathy, mild uniform thyromegaly CV: RRR no murmur, no edema, +2/4 P posterior tibialis pulses.  No carotid bruits. No JVD. Chest: CTAB, no wheeze, rhonchi or crackles.  Normal respiratory effort.  Good air movement. Abd: Soft.  Obese. NTND. BS +.  No masses palpated. No hepatosplenomegaly. No rebound tenderness or guarding. Skin: No rashes, purpura or petechiae. Warm and well-perfused. Skin intact. Neuro/Msk:  Normal gait. PERLA. EOMi. Alert. Oriented x3.  Cranial nerves II through XII intact. Muscle strength 5/5 upper/lower extremity. DTRs equal bilaterally. Psych: Normal affect, dress and demeanor. Normal speech. Normal thought content and judgment.   No exam data present  Assessment/plan: AMARIA MUNDORF is a 42 y.o. female present for CPE.  Vitamin D deficiency Currently taking about 800 units daily.  Just finished her second round of 50,000 units weekly approximately 2 weeks ago. - Vitamin D (25 hydroxy) Vitamin B12 deficiency (non anemic) Currently taking 1000 mcg a day - Vitamin B12 Obesity (BMI 30-39.9) Try Mediterranean diet, increase exercise greater than 150 minutes a week - CBC w/Diff - Comp Met (CMET) - HgB A1c Hypercholesteremia Start fish oil supplementation routinely - Lipid panel - TSH/T4 Encounter for long-term (current) use of medications - CBC w/Diff - Comp Met (CMET) Encounter for well adult exam with abnormal findings Patient was encouraged to exercise greater than 150 minutes a week. Patient was encouraged to choose a diet filled with fresh fruits and vegetables, and lean meats. AVS provided to patient today for education/recommendation on gender specific  health and safety maintenance. Colonoscopy: Colon cancer MGM (63), mom w/screen at 82 non-cancerous polyps only found.  Mammogram: No fhx. Has appt w/ Dr. Philis Pique in August. Encouraged SBE Cervical cancer screening: last pap:  2014, completed by: Dr. Philis Pique. She has a PAP scheduled.  Immunizations: tdap UTD 2015, Influenza UTD 2018 (encouraged yearly) Infectious disease screening: HIV completed 2019  Depression, recurrent ([place]) New problem today. > 10 min was spent face-to-face discussing depression, anxiety, counseling patient and providing options for treatment to her. -Discussed different medications in detail, and it was agreed that Effexor taper would be best suited for her.  Instructions provided on taper. -Follow-up 4 weeks   Return in about 1 year (around 12/26/2018) for CPE. 4 weeks for depression follow-up  Electronically signed by: Howard Pouch, DO Jessie

## 2017-12-25 NOTE — Patient Instructions (Addendum)
Health Maintenance, Female Adopting a healthy lifestyle and getting preventive care can go a long way to promote health and wellness. Talk with your health care provider about what schedule of regular examinations is right for you. This is a good chance for you to check in with your provider about disease prevention and staying healthy. In between checkups, there are plenty of things you can do on your own. Experts have done a lot of research about which lifestyle changes and preventive measures are most likely to keep you healthy. Ask your health care provider for more information. Weight and diet Eat a healthy diet  Be sure to include plenty of vegetables, fruits, low-fat dairy products, and lean protein.  Do not eat a lot of foods high in solid fats, added sugars, or salt.  Get regular exercise. This is one of the most important things you can do for your health. ? Most adults should exercise for at least 150 minutes each week. The exercise should increase your heart rate and make you sweat (moderate-intensity exercise). ? Most adults should also do strengthening exercises at least twice a week. This is in addition to the moderate-intensity exercise.  Maintain a healthy weight  Body mass index (BMI) is a measurement that can be used to identify possible weight problems. It estimates body fat based on height and weight. Your health care provider can help determine your BMI and help you achieve or maintain a healthy weight.  For females 20 years of age and older: ? A BMI below 18.5 is considered underweight. ? A BMI of 18.5 to 24.9 is normal. ? A BMI of 25 to 29.9 is considered overweight. ? A BMI of 30 and above is considered obese.  Watch levels of cholesterol and blood lipids  You should start having your blood tested for lipids and cholesterol at 40 years of age, then have this test every 5 years.  You may need to have your cholesterol levels checked more often if: ? Your lipid or  cholesterol levels are high. ? You are older than 40 years of age. ? You are at high risk for heart disease.  Cancer screening Lung Cancer  Lung cancer screening is recommended for adults 55-80 years old who are at high risk for lung cancer because of a history of smoking.  A yearly low-dose CT scan of the lungs is recommended for people who: ? Currently smoke. ? Have quit within the past 15 years. ? Have at least a 30-pack-year history of smoking. A pack year is smoking an average of one pack of cigarettes a day for 1 year.  Yearly screening should continue until it has been 15 years since you quit.  Yearly screening should stop if you develop a health problem that would prevent you from having lung cancer treatment.  Breast Cancer  Practice breast self-awareness. This means understanding how your breasts normally appear and feel.  It also means doing regular breast self-exams. Let your health care provider know about any changes, no matter how small.  If you are in your 20s or 30s, you should have a clinical breast exam (CBE) by a health care provider every 1-3 years as part of a regular health exam.  If you are 40 or older, have a CBE every year. Also consider having a breast X-ray (mammogram) every year.  If you have a family history of breast cancer, talk to your health care provider about genetic screening.  If you are at high risk   for breast cancer, talk to your health care provider about having an MRI and a mammogram every year.  Breast cancer gene (BRCA) assessment is recommended for women who have family members with BRCA-related cancers. BRCA-related cancers include: ? Breast. ? Ovarian. ? Tubal. ? Peritoneal cancers.  Results of the assessment will determine the need for genetic counseling and BRCA1 and BRCA2 testing.  Cervical Cancer Your health care provider may recommend that you be screened regularly for cancer of the pelvic organs (ovaries, uterus, and  vagina). This screening involves a pelvic examination, including checking for microscopic changes to the surface of your cervix (Pap test). You may be encouraged to have this screening done every 3 years, beginning at age 22.  For women ages 56-65, health care providers may recommend pelvic exams and Pap testing every 3 years, or they may recommend the Pap and pelvic exam, combined with testing for human papilloma virus (HPV), every 5 years. Some types of HPV increase your risk of cervical cancer. Testing for HPV may also be done on women of any age with unclear Pap test results.  Other health care providers may not recommend any screening for nonpregnant women who are considered low risk for pelvic cancer and who do not have symptoms. Ask your health care provider if a screening pelvic exam is right for you.  If you have had past treatment for cervical cancer or a condition that could lead to cancer, you need Pap tests and screening for cancer for at least 20 years after your treatment. If Pap tests have been discontinued, your risk factors (such as having a new sexual partner) need to be reassessed to determine if screening should resume. Some women have medical problems that increase the chance of getting cervical cancer. In these cases, your health care provider may recommend more frequent screening and Pap tests.  Colorectal Cancer  This type of cancer can be detected and often prevented.  Routine colorectal cancer screening usually begins at 40 years of age and continues through 40 years of age.  Your health care provider may recommend screening at an earlier age if you have risk factors for colon cancer.  Your health care provider may also recommend using home test kits to check for hidden blood in the stool.  A small camera at the end of a tube can be used to examine your colon directly (sigmoidoscopy or colonoscopy). This is done to check for the earliest forms of colorectal  cancer.  Routine screening usually begins at age 33.  Direct examination of the colon should be repeated every 5-10 years through 40 years of age. However, you may need to be screened more often if early forms of precancerous polyps or small growths are found.  Skin Cancer  Check your skin from head to toe regularly.  Tell your health care provider about any new moles or changes in moles, especially if there is a change in a mole's shape or color.  Also tell your health care provider if you have a mole that is larger than the size of a pencil eraser.  Always use sunscreen. Apply sunscreen liberally and repeatedly throughout the day.  Protect yourself by wearing long sleeves, pants, a wide-brimmed hat, and sunglasses whenever you are outside.  Heart disease, diabetes, and high blood pressure  High blood pressure causes heart disease and increases the risk of stroke. High blood pressure is more likely to develop in: ? People who have blood pressure in the high end of  the normal range (130-139/85-89 mm Hg). ? People who are overweight or obese. ? People who are African American.  If you are 21-29 years of age, have your blood pressure checked every 3-5 years. If you are 3 years of age or older, have your blood pressure checked every year. You should have your blood pressure measured twice-once when you are at a hospital or clinic, and once when you are not at a hospital or clinic. Record the average of the two measurements. To check your blood pressure when you are not at a hospital or clinic, you can use: ? An automated blood pressure machine at a pharmacy. ? A home blood pressure monitor.  If you are between 17 years and 37 years old, ask your health care provider if you should take aspirin to prevent strokes.  Have regular diabetes screenings. This involves taking a blood sample to check your fasting blood sugar level. ? If you are at a normal weight and have a low risk for diabetes,  have this test once every three years after 40 years of age. ? If you are overweight and have a high risk for diabetes, consider being tested at a younger age or more often. Preventing infection Hepatitis B  If you have a higher risk for hepatitis B, you should be screened for this virus. You are considered at high risk for hepatitis B if: ? You were born in a country where hepatitis B is common. Ask your health care provider which countries are considered high risk. ? Your parents were born in a high-risk country, and you have not been immunized against hepatitis B (hepatitis B vaccine). ? You have HIV or AIDS. ? You use needles to inject street drugs. ? You live with someone who has hepatitis B. ? You have had sex with someone who has hepatitis B. ? You get hemodialysis treatment. ? You take certain medicines for conditions, including cancer, organ transplantation, and autoimmune conditions.  Hepatitis C  Blood testing is recommended for: ? Everyone born from [birth year] through 1963. ? Anyone with known risk factors for hepatitis C.  Sexually transmitted infections (STIs)  You should be screened for sexually transmitted infections (STIs) including gonorrhea and chlamydia if: ? You are sexually active and are younger than 40 years of age. ? You are older than 40 years of age and your health care provider tells you that you are at risk for this type of infection. ? Your sexual activity has changed since you were last screened and you are at an increased risk for chlamydia or gonorrhea. Ask your health care provider if you are at risk.  If you do not have HIV, but are at risk, it may be recommended that you take a prescription medicine daily to prevent HIV infection. This is called pre-exposure prophylaxis (PrEP). You are considered at risk if: ? You are sexually active and do not regularly use condoms or know the HIV status of your partner(s). ? You take drugs by injection. ? You are  sexually active with a partner who has HIV.  Talk with your health care provider about whether you are at high risk of being infected with HIV. If you choose to begin PrEP, you should first be tested for HIV. You should then be tested every 3 months for as long as you are taking PrEP. Pregnancy  If you are premenopausal and you may become pregnant, ask your health care provider about preconception counseling.  If you may become  pregnant, take 400 to 800 micrograms (mcg) of folic acid every day.  If you want to prevent pregnancy, talk to your health care provider about birth control (contraception). Osteoporosis and menopause  Osteoporosis is a disease in which the bones lose minerals and strength with aging. This can result in serious bone fractures. Your risk for osteoporosis can be identified using a bone density scan.  If you are 31 years of age or older, or if you are at risk for osteoporosis and fractures, ask your health care provider if you should be screened.  Ask your health care provider whether you should take a calcium or vitamin D supplement to lower your risk for osteoporosis.  Menopause may have certain physical symptoms and risks.  Hormone replacement therapy may reduce some of these symptoms and risks. Talk to your health care provider about whether hormone replacement therapy is right for you. Follow these instructions at home:  Schedule regular health, dental, and eye exams.  Stay current with your immunizations.  Do not use any tobacco products including cigarettes, chewing tobacco, or electronic cigarettes.  If you are pregnant, do not drink alcohol.  If you are breastfeeding, limit how much and how often you drink alcohol.  Limit alcohol intake to no more than 1 drink per day for nonpregnant women. One drink equals 12 ounces of beer, 5 ounces of wine, or 1 ounces of hard liquor.  Do not use street drugs.  Do not share needles.  Ask your health care  provider for help if you need support or information about quitting drugs.  Tell your health care provider if you often feel depressed.  Tell your health care provider if you have ever been abused or do not feel safe at home. This information is not intended to replace advice given to you by your health care provider. Make sure you discuss any questions you have with your health care provider. Document Released: 01/29/2011 Document Revised: 12/22/2015 Document Reviewed: 04/19/2015 Elsevier Interactive Patient Education  2018 [place] refers to food and lifestyle choices that are based on the traditions of countries located on the [employer]. This way of eating has been shown to help prevent certain conditions and improve outcomes for people who have chronic diseases, like kidney disease and heart disease. What are tips for following this plan? Lifestyle  Cook and eat meals together with your family, when possible.  Drink enough fluid to keep your urine clear or pale yellow.  Be physically active every day. This includes: ? Aerobic exercise like running or swimming. ? Leisure activities like gardening, walking, or housework.  Get 7-8 hours of sleep each night.  If recommended by your health care provider, drink red wine in moderation. This means 1 glass a day for nonpregnant women and 2 glasses a day for men. A glass of wine equals 5 oz (150 mL). Reading food labels  Check the serving size of packaged foods. For foods such as rice and pasta, the serving size refers to the amount of cooked product, not dry.  Check the total fat in packaged foods. Avoid foods that have saturated fat or trans fats.  Check the ingredients list for added sugars, such as corn syrup. Shopping  At the grocery store, buy most of your food from the areas near the walls of the store. This includes: ? Fresh fruits and vegetables (produce). ? Grains,  beans, nuts, and seeds. Some of these may be  available in unpackaged forms or large amounts (in bulk). ? Fresh seafood. ? Poultry and eggs. ? Low-fat dairy products.  Buy whole ingredients instead of prepackaged foods.  Buy fresh fruits and vegetables in-season from local farmers markets.  Buy frozen fruits and vegetables in resealable bags.  If you do not have access to quality fresh seafood, buy precooked frozen shrimp or canned fish, such as tuna, salmon, or sardines.  Buy small amounts of raw or cooked vegetables, salads, or olives from the deli or salad bar at your store.  Stock your pantry so you always have certain foods on hand, such as olive oil, canned tuna, canned tomatoes, rice, pasta, and beans. Cooking  Cook foods with extra-virgin olive oil instead of using butter or other vegetable oils.  Have meat as a side dish, and have vegetables or grains as your main dish. This means having meat in small portions or adding small amounts of meat to foods like pasta or stew.  Use beans or vegetables instead of meat in common dishes like chili or lasagna.  Experiment with different cooking methods. Try roasting or broiling vegetables instead of steaming or sauteing them.  Add frozen vegetables to soups, stews, pasta, or rice.  Add nuts or seeds for added healthy fat at each meal. You can add these to yogurt, salads, or vegetable dishes.  Marinate fish or vegetables using olive oil, lemon juice, garlic, and fresh herbs. Meal planning  Plan to eat 1 vegetarian meal one day each week. Try to work up to 2 vegetarian meals, if possible.  Eat seafood 2 or more times a week.  Have healthy snacks readily available, such as: ? Vegetable sticks with hummus. ? [place] yogurt. ? Fruit and nut trail mix.  Eat balanced meals throughout the week. This includes: ? Fruit: 2-3 servings a day ? Vegetables: 4-5 servings a day ? Low-fat dairy: 2 servings a day ? Fish, poultry, or lean meat:  1 serving a day ? Beans and legumes: 2 or more servings a week ? Nuts and seeds: 1-2 servings a day ? Whole grains: 6-8 servings a day ? Extra-virgin olive oil: 3-4 servings a day  Limit red meat and sweets to only a few servings a month What are my food choices?  Mediterranean diet ? Recommended ? Grains: Whole-grain pasta. Brown rice. Bulgar wheat. Polenta. Couscous. Whole-wheat bread. [place]. ? Vegetables: Artichokes. Beets. Broccoli. Cabbage. Carrots. Eggplant. Green beans. Chard. Kale. Spinach. Onions. Leeks. Peas. Squash. Tomatoes. Peppers. Radishes. ? Fruits: Apples. Apricots. Avocado. Berries. Bananas. Cherries. Dates. Figs. Grapes. Lemons. Melon. Oranges. Peaches. Plums. Pomegranate. ? Meats and other protein foods: Beans. Almonds. Sunflower seeds. Pine nuts. Peanuts. [place]. Salmon. Scallops. Shrimp. [place]. Tilapia. Clams. Oysters. Eggs. ? Dairy: Low-fat milk. Cheese. Greek yogurt. ? Beverages: Water. Red wine. Herbal tea. ? Fats and oils: Extra virgin olive oil. Avocado oil. Grape seed oil. ? Sweets and desserts: [place] yogurt with honey. Baked apples. Poached pears. Trail mix. ? Seasoning and other foods: Basil. Cilantro. Coriander. Cumin. Mint. Parsley. Sage. Rosemary. Tarragon. Garlic. Oregano. Thyme. Pepper. Balsalmic vinegar. Tahini. Hummus. Tomato sauce. Olives. Mushrooms. ? Limit these ? Grains: Prepackaged pasta or rice dishes. Prepackaged cereal with added sugar. ? Vegetables: Deep fried potatoes (french fries). ? Fruits: Fruit canned in syrup. ? Meats and other protein foods: Beef. Pork. Lamb. Poultry with skin. Hot dogs. [place] Salines. ? Dairy: Ice cream. Sour cream. Whole milk. ? Beverages: Juice. Sugar-sweetened soft drinks. Beer. Liquor and spirits. ? Fats and oils:  Butter. [place] oil. Vegetable oil. Beef fat (tallow). Lard. ? Sweets and desserts: Cookies. Cakes. Pies. Candy. ? Seasoning and other foods: Mayonnaise. Premade sauces and marinades. ? The items listed  may not be a complete list. Talk with your dietitian about what dietary choices are right for you. Summary  The Mediterranean diet includes both food and lifestyle choices.  Eat a variety of fresh fruits and vegetables, beans, nuts, seeds, and whole grains.  Limit the amount of red meat and sweets that you eat.  Talk with your health care provider about whether it is safe for you to drink red wine in moderation. This means 1 glass a day for nonpregnant women and 2 glasses a day for men. A glass of wine equals 5 oz (150 mL). This information is not intended to replace advice given to you by your health care provider. Make sure you discuss any questions you have with your health care provider. Document Released: 03/08/2016 Document Revised: 04/10/2016 Document Reviewed: 03/08/2016 Elsevier Interactive Patient Education  [employer].

## 2017-12-25 NOTE — Telephone Encounter (Signed)
Please inform patient the following information: Her labs are stable with the exception of her Vit D. It is up to 17, but still too low.  I want her to try one more round of 12 weeks prescribed 50000u, but take twice a week (wed/sun) space days out. She is to also increase her OTC to 1000u daily. Always take with meals.  F/u 11 weeks for provider appt and retesting. If still ow will  Need to consider additional work up.

## 2017-12-26 ENCOUNTER — Encounter: Payer: Self-pay | Admitting: Family Medicine

## 2017-12-26 ENCOUNTER — Ambulatory Visit: Payer: 59 | Admitting: Neurology

## 2017-12-26 ENCOUNTER — Encounter: Payer: Self-pay | Admitting: Neurology

## 2017-12-26 VITALS — BP 146/94 | HR 82 | Resp 16 | Ht 68.0 in | Wt 225.0 lb

## 2017-12-26 DIAGNOSIS — R200 Anesthesia of skin: Secondary | ICD-10-CM

## 2017-12-26 DIAGNOSIS — R202 Paresthesia of skin: Secondary | ICD-10-CM

## 2017-12-26 DIAGNOSIS — G373 Acute transverse myelitis in demyelinating disease of central nervous system: Secondary | ICD-10-CM

## 2017-12-26 DIAGNOSIS — Z79899 Other long term (current) drug therapy: Secondary | ICD-10-CM | POA: Insufficient documentation

## 2017-12-26 NOTE — Progress Notes (Signed)
[facility]  PATIENT: Teresa Wong DOB: 11-08-1975  REFERRING DOCTOR OR PCP:  Howard Pouch SOURCE: patient, notes from Dr. Raoul Pitch.  _________________________________   HISTORICAL  CHIEF COMPLAINT:  Chief Complaint  Patient presents with  . Transverse Myelitis    Last MRI T-spine showed no new lesions, and the one lesion that was there looked better.  Pt. denies new sx/fim    HISTORY OF PRESENT ILLNESS:  Teresa Wong an episode of numbness in October 2018.  Update 12/26/2017: She is doing well and denies any new episodes of numbness, weakness, clumsiness or other neurologic symptoms.  The numbness that she experienced in October 2018 has completely resolved with passage of time and after she received a couple days of steroids at that time.  Follow-up brain MRI 12/05/2017 was normal.  Earlier in the year the follow-up MRI of the thoracic spine performed 09/11/2017 showed the same lesions at T8-T9 and T10 that had been seen previously but they were smaller on the more recent study than the study from 2018.  There is no abnormal enhancement.  We had a discussion about the significance of the transverse myelitis.  It could be post vaccination as it occurred 3 or 4 weeks after a flu vaccine or postinfectious.  However, as she has evidence of 2 thoracic lesions and not just one, we need to also be concerned about initial presentation of multiple sclerosis.  Update 08/28/2017: She has had 2 episodes of severe numbness, one about 3 years ago and one about 3 months ago.    MRI of the thoracic spine 05/28/2017 showed a focus at T10 that mildly expanded the spinal cord and would be consistent with an acute transverse myelitis. She also had a smaller anterior spinal cord focus to the right at T8.   MRI of the brain showed a single nonspecific focus in the genu.     She received 2 days of IV Solu-Medrol and her symptoms improved. She has not had any numbness returning since  that time. We had a long discussion today about the significance of the finding and I showed her the MRI's. She appears to have had 2 episodes of transverse myelitis. The focus in the brain is nonspecific. I can't rule out that this could be an early presentation of MS. I think the likelihood that the thoracic lesion represents a non-inflammatory transverse myelitis is very small but we discussed the necessity to repeat an MRI of the thoracic spine to make sure that this is not a tumor. We also discussed that the brain MRI will have to be repeated sometime later this year to make sure that she is not experiencing more lesions. If there has been progression, the likelihood that this could represent MS is higher and I would consider further evaluation and treatment.  From 05/23/2017: About a week ago, after sleeping on a sofa with her 18 yo daughter, she woke up with discomfort in the lower back and numbness from the buttocks down.   She thinks she was sleeping on her left side but a little bit twisted. Numbness is partial and she can feel heat/cold and sharp pain.   She has had a tingling with pins and needle sensation that has not resolved.    She denies any difficulty with her gait. She can stand with her eyes closed. There is no clumsiness or weakness in the legs. She denies any difficulty with her bladder. She does not note any symptoms in the arms  or face. There is no change in vision.  She saw Dr. Raoul Pitch and was placed on steroids as a slow taper from 60 mg down. Over the last couple days she feels that there is some improvement of the numbness in the buttocks but she continues to have the tingling lower down in the leg.  Currently the symptoms are from the upper thighs down.  She has not had another episode of numbness like this in the past. However, she has had intermittent lower back discomfort since 3 years ago. Shortly after her second child was born she lifted him up and had the onset of pain in the  lower back. Since then, she would get episodes lasting a few days to a week or so of further discomfort that would usually resolve with time and over the counter medications.  She denied recent infection but her daughter had a mild GI viral syndrome 1-2 days before her symptoms.    She has no recent problem with headaches.          REVIEW OF SYSTEMS: Constitutional: No fevers, chills, sweats, or change in appetite.   She has gained some weight over the last year. Eyes: No visual changes, double vision, eye pain Ear, nose and throat: No hearing loss, ear pain, nasal congestion, sore throat Cardiovascular: No chest pain, palpitations Respiratory: No shortness of breath at rest or with exertion.   No wheezes GastrointestinaI: No nausea, vomiting, diarrhea, abdominal pain, fecal incontinence Genitourinary: No dysuria, urinary retention or frequency.  No nocturia. Musculoskeletal: No neck pain, back pain Integumentary: No rash, pruritus, skin lesions Neurological: as above Psychiatric: No depression at this time.  No anxiety Endocrine: No palpitations, diaphoresis, change in appetite or increased thirst Hematologic/Lymphatic: No anemia, purpura, petechiae. Allergic/Immunologic: No itchy/runny eyes, nasal congestion, recent allergic reactions, rashes  ALLERGIES: No Known Allergies  HOME MEDICATIONS:  Current Outpatient Medications:  .  cholecalciferol (VITAMIN D) 1000 units tablet, Take 1,000 Units by mouth daily., Disp: , Rfl:  .  cyanocobalamin 1000 MCG tablet, Take 1,000 mcg by mouth daily., Disp: , Rfl:  .  Multiple Vitamin (MULTIVITAMIN) tablet, Take 1 tablet by mouth daily., Disp: , Rfl:  .  venlafaxine XR (EFFEXOR-XR) 75 MG 24 hr capsule, Take 75 mg by mouth daily with breakfast., Disp: , Rfl:  .  Vitamin D, Ergocalciferol, (DRISDOL) 50000 units CAPS capsule, 1 capsule twice a week., Disp: 24 capsule, Rfl: 0 .  venlafaxine XR (EFFEXOR XR) 37.5 MG 24 hr capsule, Take 1 capsule  (37.5 mg total) by mouth daily with breakfast for 7 days, THEN 2 capsules (75 mg total) daily with breakfast for 23 days., Disp: 53 capsule, Rfl: 0  PAST MEDICAL HISTORY: Past Medical History:  Diagnosis Date  . Abnormal Pap smear   . 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  . 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 History of LEEP (loop electrosurgical excision procedure) of cervix complicating pregnancy 4098  . Meningitis 1984  . Postpartum depression   . Pregnancy induced hypertension 2010, 2012   History - resolved - no problems with current pregnancy 2015    PAST SURGICAL HISTORY: Past Surgical History:  Procedure Laterality Date  . CERVICAL BIOPSY  W/ LOOP ELECTRODE EXCISION  2006  . CESAREAN SECTION N/A 11/26/2013   Procedure: CESAREAN SECTION;  Surgeon: Daria Pastures, MD;  Location: [facility];  Service: Obstetrics;  Laterality: N/A;  . CHOLECYSTECTOMY    .  COLPOSCOPY  09/2004  . DILATION AND CURETTAGE OF UTERUS  11/2011,08/2010,12/2007,08/2007   x4  . DILATION AND EVACUATION  12/18/2011   Procedure: DILATATION AND EVACUATION;  Surgeon: Daria Pastures, MD;  Location: [facility];  Service: Gynecology;  Laterality: N/A;  . LAPAROSCOPIC CHOLECYSTECTOMY SINGLE PORT N/A 09/28/2017   Procedure: LAPAROSCOPIC CHOLECYSTECTOMY SINGLE SITE WITH CHOLANGIOGRAM;  Surgeon: Michael Boston, MD;  Location: [facility];  Service: General;  Laterality: N/A;  . TUBAL LIGATION Bilateral 11/26/2013   Procedure: BILATERAL TUBAL LIGATION;  Surgeon: Daria Pastures, MD;  Location: [facility];  Service: Obstetrics;  Laterality: Bilateral;  . WISDOM TOOTH EXTRACTION      FAMILY HISTORY: Family History  Problem Relation Age of Onset  . Hypertension Mother   . Hyperlipidemia Mother   . CAD Father 78       Bypass surgery 1.5 yrs ago   . Hyperlipidemia Father   . Cancer Father        bladder  . Bladder Cancer Father   . Diverticulitis Father   . Heart disease Maternal  Grandfather   . Colon cancer Maternal Grandmother 62  . Miscarriages / Stillbirths Paternal Grandmother     SOCIAL HISTORY:  Social History   Socioeconomic History  . Marital status: Married    Spouse name: Not on file  . Number of children: 2  . Years of education: some colle  . Highest education level: Not on file  Occupational History  . Not on file  Social Needs  . Financial resource strain: Not on file  . Food insecurity:    Worry: Not on file    Inability: Not on file  . Transportation needs:    Medical: Not on file    Non-medical: Not on file  Tobacco Use  . Smoking status: Never Smoker  . Smokeless tobacco: Never Used  Substance and Sexual Activity  . Alcohol use: No  . Drug use: No  . Sexual activity: Yes    Birth control/protection: Surgical    Comment: pregnant   Lifestyle  . Physical activity:    Days per week: Not on file    Minutes per session: Not on file  . Stress: Not on file  Relationships  . Social connections:    Talks on phone: Not on file    Gets together: Not on file    Attends religious service: Not on file    Active member of club or organization: Not on file    Attends meetings of clubs or organizations: Not on file    Relationship status: Not on file  . Intimate partner violence:    Fear of current or ex partner: Not on file    Emotionally abused: Not on file    Physically abused: Not on file    Forced sexual activity: Not on file  Other Topics Concern  . Not on file  Social History Narrative   [name] lives with her husband & 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     PHYSICAL EXAM  Vitals:   12/26/17 1325  BP: (!) 146/94  Pulse: 82  Resp: 16  Weight: 225 lb (102.1 kg)  Height: 5' 8"  (1.727 m)    Body mass index is 34.21 kg/m.   General: The patient is well-developed and well-nourished and in no acute distress   Neurologic Exam  Mental status: The patient is alert and oriented x 3 at the time of the examination.  The patient has apparent normal recent  and remote memory, with an apparently normal attention span and concentration ability.   Speech is normal.  Cranial nerves: Extraocular movements are full.  Facial strength and sensation is normal.  The trapezius has normal strength.  The tongue is midline, and the patient has symmetric elevation of the soft palate. Mild reduced right hearing..  Motor:  Muscle bulk is normal.   Tone is normal.  Strength is 5/5 in the arms and legs..   Sensory: There is intact sensation to touch, temperature and vibration in the arms and legs.  Coordination: Cerebellar testing reveals good finger-nose-finger and heel-to-shin bilaterally.  Gait and station: Station is normal.   The gait and tandem gait are normal.. Romberg is negative.   Reflexes: Deep tendon reflexes are symmetric and normal in the arms and legs.  There is no spread at the knees or clonus at the ankles (as had been present initially)     DIAGNOSTIC DATA (LABS, IMAGING, TESTING) - I reviewed patient records, labs, notes, testing and imaging myself where available.  Lab Results  Component Value Date   WBC 6.0 12/25/2017   HGB 13.0 12/25/2017   HCT 38.2 12/25/2017   MCV 85.3 12/25/2017   PLT 364.0 12/25/2017      Component Value Date/Time   NA 138 12/25/2017 0942   K 4.0 12/25/2017 0942   CL 103 12/25/2017 0942   CO2 26 12/25/2017 0942   GLUCOSE 88 12/25/2017 0942   BUN 11 12/25/2017 0942   CREATININE 0.52 12/25/2017 0942   CREATININE 0.53 05/20/2017 1504   CALCIUM 9.2 12/25/2017 0942   PROT 7.1 12/25/2017 0942   ALBUMIN 4.4 12/25/2017 0942   AST 11 12/25/2017 0942   ALT 11 12/25/2017 0942   ALKPHOS 62 12/25/2017 0942   BILITOT 0.5 12/25/2017 0942   GFRNONAA >60 09/27/2017 2235   GFRAA >60 09/27/2017 2235   Lab Results  Component Value Date   CHOL 201 (H) 12/25/2017   HDL 50.00 12/25/2017   LDLCALC 133 (H) 12/25/2017   TRIG 92.0 12/25/2017   CHOLHDL 4 12/25/2017   Lab Results    Component Value Date   HGBA1C 5.4 12/25/2017   Lab Results  Component Value Date   VITAMINB12 515 12/25/2017   Lab Results  Component Value Date   TSH 1.22 12/25/2017       ASSESSMENT AND PLAN  Transverse myelitis (HCC)  Numbness and tingling of both legs  1.    She is doing well and has not had any more neurologic events or new lesions on MRI.  We will need to check the MRI of the brain once more in about a year to determine if there is any subclinical progression.  If present, the likelihood of MS would be much higher.  If she goes an additional year without lesions, then MS becomes less likely and we will just follow-up as needed.   2.    Return to see me in about 12 months.  We will check the MRI of the brain a few weeks earlier Call sooner if new or worsening neurologic symptoms. Thurl Boen A. Felecia Shelling, MD, [facility] 08/17/4172, 0:81 PM Certified in Neurology, Clinical Neurophysiology, Sleep Medicine, Pain Medicine and Neuroimaging  [facility] [address] [place] [place], [place] [address] [phone]

## 2017-12-26 NOTE — Telephone Encounter (Signed)
Spoke with patient reviewed lab results and instructions. Patient verbalized understanding. Patient will check her schedule and call back to schedule appt with provider .

## 2018-01-14 MED FILL — VIT D2 1.25 MG (50,000 UNIT: 1.25 MG | 84 days supply | Qty: 24 | Fill #0

## 2018-01-21 ENCOUNTER — Telehealth: Payer: Self-pay | Admitting: Family Medicine

## 2018-01-21 NOTE — Telephone Encounter (Signed)
I called patient as instructed by Estill Bamberg to r/s her followup appointment scheduled for 01/22/18.  Patient has been rescheduled to 02/03/18 as this was the next available late afternoon appt with pcp(patient needs late appt due to schedule).   Patient states she will run out of venlafaxine XR (EFFEXOR XR) 37.5 MG 24 hr capsule before this appt and will need a refill.  She states she is doing well on the medication and she is starting to feel better after starting the medication.  Pharmacy: [place], [place] - [place] [phone] (Phone) [phone] (Fax)

## 2018-01-22 ENCOUNTER — Ambulatory Visit: Payer: 59 | Admitting: Family Medicine

## 2018-01-22 NOTE — Telephone Encounter (Signed)
Called patient left message for patient to return call

## 2018-01-23 ENCOUNTER — Encounter: Payer: Self-pay | Admitting: *Deleted

## 2018-01-23 NOTE — Telephone Encounter (Signed)
Need clarification on what dose she is taking so we can send some medication in to get her through until her appointment.Sent message in [facility] Chart .

## 2018-01-24 ENCOUNTER — Other Ambulatory Visit: Payer: Self-pay

## 2018-01-24 ENCOUNTER — Telehealth: Payer: Self-pay | Admitting: Family Medicine

## 2018-01-24 MED ORDER — VENLAFAXINE HCL ER 37.5 MG PO CP24: ORAL_CAPSULE | ORAL | 0 refills | Status: DC

## 2018-01-24 MED FILL — VENLAFAXINE HCL ER 37.5 MG: 37.5 | 18 days supply | Qty: 30 | Fill #0

## 2018-01-24 NOTE — Telephone Encounter (Signed)
Rx sent to pharmacy. See note.

## 2018-01-24 NOTE — Telephone Encounter (Signed)
Copied from [place] [id number]. Topic: Quick Communication - See Telephone Encounter >> Jan 24, 2018 12:45 PM Hewitt Shorts wrote: [facility] is calling with questions regarding the instructions for effexor  and also they did not give enough of a quanity  when this was called  in   Best number [phone]

## 2018-01-24 NOTE — Telephone Encounter (Signed)
Rx sent to pharmacy, see note.

## 2018-01-24 NOTE — Telephone Encounter (Signed)
Refill sent in for 2 weeks, until patients next appointment.

## 2018-01-25 NOTE — Telephone Encounter (Signed)
Effexor was sent in on 01/24/18 and pharmacy called with questions, will need a return call.  Pharmacy: [place], [place] - [place] [phone] (Phone) [phone] (Fax)

## 2018-01-27 NOTE — Telephone Encounter (Signed)
Called pharmacy order should be Effexor 37.5 2 capsules daily dispense 60 capsules.

## 2018-02-03 ENCOUNTER — Encounter: Payer: Self-pay | Admitting: Family Medicine

## 2018-02-03 ENCOUNTER — Ambulatory Visit: Payer: 59 | Admitting: Family Medicine

## 2018-02-03 VITALS — BP 115/73 | HR 82 | Temp 98.5°F | Resp 20 | Ht 68.0 in | Wt 226.0 lb

## 2018-02-03 DIAGNOSIS — F339 Major depressive disorder, recurrent, unspecified: Secondary | ICD-10-CM | POA: Diagnosis not present

## 2018-02-03 MED ORDER — VENLAFAXINE HCL ER 75 MG PO CP24: 75.0000 mg | ORAL_CAPSULE | Freq: Every day | ORAL | 1 refills | Status: DC

## 2018-02-03 MED FILL — VENLAFAXINE HCL ER 75 MG CA: 75 | 90 days supply | Qty: 90 | Fill #0

## 2018-02-03 NOTE — Progress Notes (Signed)
Patient ID: Teresa Wong, female  DOB: [birth year], 42 y.o.   MRN: [id number] Patient Care Team    Relationship Specialty Notifications Start End  Ma Hillock, DO PCP - General Family Medicine  03/04/15   Britt Bottom, MD Consulting Physician Neurology  09/28/17   Bobbye Charleston, MD Consulting Physician Obstetrics and Gynecology  12/25/17     Chief Complaint  Patient presents with  . Depression    Subjective:  Teresa Wong is a 42 y.o.  Female  present for  Depression with anxiety:  She reports after starting effexor her mood has improved greatly. Her coworkers and husband have commented on how well she is doing. Prior note:  Patient reports her husband has noticed she has become "more down" as of lately.  She feels she has been struggling with her depression for about 2 years, and it has slowly been progressing over the last year.  She reports she is not as outgoing as she used to be.  She is having some difficulty surrounding her potential diagnosis of Teresa.  She feels she is sleeping okay.  She does always feel tired.  She has a history of vitamin B and vitamin D deficiencies.  She is over eating.  She is having trouble concentrating.  PHQ 9 completed today as a score 12.  Anxiety assessment with a score of 9.  She did suffer from postpartum depression in the past, and had been on Prozac.   Depression screen [facility] 2/9 02/03/2018 12/25/2017 05/27/2017  Decreased Interest 0 2 0  Down, Depressed, Hopeless 0 2 0  PHQ - 2 Score 0 4 0  Altered sleeping 1 0 -  Tired, decreased energy 1 2 -  Change in appetite 0 3 -  Feeling bad or failure about yourself  0 1 -  Trouble concentrating 0 2 -  Moving slowly or fidgety/restless 0 0 -  Suicidal thoughts 0 0 -  PHQ-9 Score 2 12 -   GAD 7 : Generalized Anxiety Score 12/25/2017  Nervous, Anxious, on Edge 1  Control/stop worrying 2  Worry too much - different things 2  Trouble relaxing 2  Restless 0  Easily annoyed or irritable 2    Afraid - awful might happen 0  Total GAD 7 Score 9    Immunization History  Administered Date(s) Administered  . Hepatitis B 11/24/2014, 03/04/2015  . Influenza-Unspecified 04/18/2017  . PPD Test 03/04/2015, 03/07/2015  . Tdap 11/26/2013  . Varicella 11/24/2014, 03/04/2015     Past Medical History:  Diagnosis Date  . Abnormal Pap smear   . 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  . 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 History of LEEP (loop electrosurgical excision procedure) of cervix complicating pregnancy 4970  . Meningitis 1984  . Postpartum depression   . Pregnancy induced hypertension 2010, 2012   History - resolved - no problems with current pregnancy 2015   No Known Allergies Past Surgical History:  Procedure Laterality Date  . CERVICAL BIOPSY  W/ LOOP ELECTRODE EXCISION  2006  . CESAREAN SECTION N/A 11/26/2013   Procedure: CESAREAN SECTION;  Surgeon: Daria Pastures, MD;  Location: [facility];  Service: Obstetrics;  Laterality: N/A;  . CHOLECYSTECTOMY    . COLPOSCOPY  09/2004  . DILATION AND CURETTAGE OF UTERUS  11/2011,08/2010,12/2007,08/2007   x4  . DILATION AND EVACUATION  12/18/2011   Procedure: DILATATION AND  EVACUATION;  Surgeon: Daria Pastures, MD;  Location: [facility];  Service: Gynecology;  Laterality: N/A;  . LAPAROSCOPIC CHOLECYSTECTOMY SINGLE PORT N/A 09/28/2017   Procedure: LAPAROSCOPIC CHOLECYSTECTOMY SINGLE SITE WITH CHOLANGIOGRAM;  Surgeon: Michael Boston, MD;  Location: [facility];  Service: General;  Laterality: N/A;  . TUBAL LIGATION Bilateral 11/26/2013   Procedure: BILATERAL TUBAL LIGATION;  Surgeon: Daria Pastures, MD;  Location: [facility];  Service: Obstetrics;  Laterality: Bilateral;  . WISDOM TOOTH EXTRACTION     Family History  Problem Relation Age of Onset  . Hypertension Mother   . Hyperlipidemia Mother   . CAD Father 73       Bypass surgery 1.5 yrs ago   . Hyperlipidemia Father   . Cancer Father         bladder  . Bladder Cancer Father   . Diverticulitis Father   . Heart disease Maternal Grandfather   . Colon cancer Maternal Grandmother 50  . Miscarriages / Stillbirths Paternal Grandmother    Social History   Socioeconomic History  . Marital status: Married    Spouse name: Not on file  . Number of children: 2  . Years of education: some colle  . Highest education level: Not on file  Occupational History  . Not on file  Social Needs  . Financial resource strain: Not on file  . Food insecurity:    Worry: Not on file    Inability: Not on file  . Transportation needs:    Medical: Not on file    Non-medical: Not on file  Tobacco Use  . Smoking status: Never Smoker  . Smokeless tobacco: Never Used  Substance and Sexual Activity  . Alcohol use: No  . Drug use: No  . Sexual activity: Yes    Birth control/protection: Surgical    Comment: pregnant   Lifestyle  . Physical activity:    Days per week: Not on file    Minutes per session: Not on file  . Stress: Not on file  Relationships  . Social connections:    Talks on phone: Not on file    Gets together: Not on file    Attends religious service: Not on file    Active member of club or organization: Not on file    Attends meetings of clubs or organizations: Not on file    Relationship status: Not on file  . Intimate partner violence:    Fear of current or ex partner: Not on file    Emotionally abused: Not on file    Physically abused: Not on file    Forced sexual activity: Not on file  Other Topics Concern  . 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   Allergies as of 02/03/2018   No Known Allergies     Medication List        Accurate as of 02/03/18 11:59 PM. Always use your most recent med list.          cholecalciferol 1000 units tablet Commonly known as:  VITAMIN D Take 1,000 Units by mouth daily.   cyanocobalamin 1000 MCG tablet Take  1,000 mcg by mouth daily.   multivitamin tablet Take 1 tablet by mouth daily.   venlafaxine XR 75 MG 24 hr capsule Commonly known as:  EFFEXOR-XR Take 1 capsule (75 mg total) by mouth daily with breakfast.   Vitamin D (Ergocalciferol) 50000 units Caps capsule  Commonly known as:  DRISDOL 1 capsule twice a week.       All past medical history, surgical history, allergies, family history, immunizations andmedications were updated in the EMR today and reviewed under the history and medication portions of their EMR.      ROS: 14 pt review of systems performed and negative (unless mentioned in an HPI)  Objective: BP 115/73 (BP Location: Left Arm, Patient Position: Sitting, Cuff Size: Large)   Pulse 82   Temp 98.5 F (36.9 C)   Resp 20   Ht 5' 8"  (1.727 m)   Wt 226 lb (102.5 kg)   SpO2 98%   BMI 34.36 kg/m  Gen: Afebrile. No acute distress.  HENT: AT. [place].  MMM.  Eyes:Pupils Equal Round Reactive to light, Extraocular movements intact,  Conjunctiva without redness, discharge or icterus. CV: RRR Chest: CTAB, no wheeze or crackles Neuro:  Normal gait. PERLA. EOMi. Alert. Oriented x3  Psych: Normal affect, dress and demeanor. Normal speech. Normal thought content and judgment.   No exam data present  Assessment/plan: Teresa Wong is a 42 y.o. female present for CPE.  Depression, recurrent ([place]) - doing well on medication. Much improved.  - effexor 75 mg QD prescribed.  - F/U 6 month unless needed sooner.    Return in about 6 months (around 08/06/2018) for depression.   Electronically signed by: Howard Pouch, DO [place]

## 2018-02-03 NOTE — Patient Instructions (Signed)
I am glad you are doing so well.  Refills provided on the 75 mg dose for a total of 6 months. Follow up in 6 months sooner if needed.   Please help [place] help you:  We are honored you have chosen [facility] for your [facility] home. Below you will find basic instructions that you may need to access in the future. Please help [place] help you by reading the instructions, which cover many of the frequent questions we experience.   Prescription refills and request:  -In order to allow more efficient response time, please call your pharmacy for all refills. They will forward the request electronically to [place]. This allows for the quickest possible response. Request left on a nurse line can take longer to refill, since these are checked as time allows between office patients and other phone calls.  - refill request can take up to 3-5 working days to complete.  - If request is sent electronically and request is appropiate, it is usually completed in 1-2 business days.  - all patients will need to be seen routinely for all chronic medical conditions requiring prescription medications (see follow-up below). If you are overdue for follow up on your condition, you will be asked to make an appointment and we will call in enough medication to cover you until your appointment (up to 30 days).  - all controlled substances will require a face to face visit to request/refill.  - if you desire your prescriptions to go through a new pharmacy, and have an active script at original pharmacy, you will need to call your pharmacy and have scripts transferred to new pharmacy. This is completed between the pharmacy locations and not by your provider.    Results: If any images or labs were ordered, it can take up to 1 week to get results depending on the test ordered and the lab/facility running and resulting the test. - Normal or stable results, which do not need further discussion, may be released to your mychart immediately  with attached note to you. A call may not be generated for normal results. Please make certain to sign up for mychart. If you have questions on how to activate your mychart you can call the front office.  - If your results need further discussion, our office will attempt to contact you via phone, and if unable to reach you after 2 attempts, we will release your abnormal result to your mychart with instructions.  - All results will be automatically released in mychart after 1 week.  - Your provider will provide you with explanation and instruction on all relevant material in your results. Please keep in mind, results and labs may appear confusing or abnormal to the untrained eye, but it does not mean they are actually abnormal for you personally. If you have any questions about your results that are not covered, or you desire more detailed explanation than what was provided, you should make an appointment with your provider to do so.   Our office handles many outgoing and incoming calls daily. If we have not contacted you within 1 week about your results, please check your mychart to see if there is a message first and if not, then contact our office.  In helping with this matter, you help decrease call volume, and therefore allow [place] to be able to respond to patients needs more efficiently.   Acute office visits (sick visit):  An acute visit is intended for a new problem  and are scheduled in shorter time slots to allow schedule openings for patients with new problems. This is the appropriate visit to discuss a new problem. Problems will not be addressed by phone call or Echart message. Appointment is needed if requesting treatment. In order to provide you with excellent quality medical care with proper time for you to explain your problem, have an exam and receive treatment with instructions, these appointments should be limited to one new problem per visit. If you experience a new problem, in which you  desire to be addressed, please make an acute office visit, we save openings on the schedule to accommodate you. Please do not save your new problem for any other type of visit, let us take care of it properly and quickly for you.   Follow up visits:  Depending on your condition(s) your provider will need to see you routinely in order to provide you with quality care and prescribe medication(s). Most chronic conditions (Example: hypertension, Diabetes, depression/anxiety... etc), require visits a couple times a year. Your provider will instruct you on proper follow up for your personal medical conditions and history. Please make certain to make follow up appointments for your condition as instructed. Failing to do so could result in lapse in your medication treatment/refills. If you request a refill, and are overdue to be seen on a condition, we will always provide you with a 30 day script (once) to allow you time to schedule.    Medicare wellness (well visit): - we have a wonderful Nurse Maudie Mercury), that will meet with you and provide you will yearly medicare wellness visits. These visits should occur yearly (can not be scheduled less than 1 calendar year apart) and cover preventive health, immunizations, advance directives and screenings you are entitled to yearly through your medicare benefits. Do not miss out on your entitled benefits, this is when medicare will pay for these benefits to be ordered for you.  These are strongly encouraged by your provider and is the appropriate type of visit to make certain you are up to date with all preventive health benefits. If you have not had your medicare wellness exam in the last 12 months, please make certain to schedule one by calling the office and schedule your medicare wellness with Maudie Mercury as soon as possible.   Yearly physical (well visit):  - Adults are recommended to be seen yearly for physicals. Check with your insurance and date of your last physical, most  insurances require one calendar year between physicals. Physicals include all preventive health topics, screenings, medical exam and labs that are appropriate for gender/age and history. You may have fasting labs needed at this visit. This is a well visit (not a sick visit), new problems should not be covered during this visit (see acute visit).  - Pediatric patients are seen more frequently when they are younger. Your provider will advise you on well child visit timing that is appropriate for your their age. - This is not a medicare wellness visit. Medicare wellness exams do not have an exam portion to the visit. Some medicare companies allow for a physical, some do not allow a yearly physical. If your medicare allows a yearly physical you can schedule the medicare wellness with our nurse Maudie Mercury and have your physical with your provider after, on the same day. Please check with insurance for your full benefits.   Late Policy/No Shows:  - all new patients should arrive 15-30 minutes earlier than appointment to allow [place] time  to  obtain all personal demographics,  insurance information and for you to complete office paperwork. - All established patients should arrive 10-15 minutes earlier than appointment time to update all information and be checked in .  - In our best efforts to run on time, if you are late for your appointment you will be asked to either reschedule or if able, we will work you back into the schedule. There will be a wait time to work you back in the schedule,  depending on availability.  - If you are unable to make it to your appointment as scheduled, please call 24 hours ahead of time to allow [place] to fill the time slot with someone else who needs to be seen. If you do not cancel your appointment ahead of time, you may be charged a no show fee.

## 2018-02-07 ENCOUNTER — Encounter: Payer: Self-pay | Admitting: Family Medicine

## 2018-04-24 ENCOUNTER — Encounter: Payer: Self-pay | Admitting: Family Medicine

## 2018-06-04 MED FILL — VENLAFAXINE HCL ER 75 MG CA: 75 | 90 days supply | Qty: 90 | Fill #1

## 2018-06-20 ENCOUNTER — Ambulatory Visit (INDEPENDENT_AMBULATORY_CARE_PROVIDER_SITE_OTHER): Payer: Self-pay | Admitting: Nurse Practitioner

## 2018-06-20 VITALS — BP 115/88 | HR 86 | Temp 98.6°F | Resp 18 | Wt 229.4 lb

## 2018-06-20 DIAGNOSIS — J209 Acute bronchitis, unspecified: Secondary | ICD-10-CM

## 2018-06-20 MED ORDER — BENZONATATE 200 MG PO CAPS: 200.0000 mg | ORAL_CAPSULE | Freq: Three times a day (TID) | ORAL | 0 refills | Status: AC | PRN

## 2018-06-20 MED ORDER — PROMETHAZINE-DM 6.25-15 MG/5ML PO SYRP: 5.0000 mL | ORAL_SOLUTION | Freq: Four times a day (QID) | ORAL | 0 refills | Status: AC | PRN

## 2018-06-20 MED ORDER — PREDNISONE 10 MG (21) PO TBPK: ORAL_TABLET | ORAL | 0 refills | Status: AC

## 2018-06-20 MED ORDER — ALBUTEROL SULFATE HFA 108 (90 BASE) MCG/ACT IN AERS: 2.0000 | INHALATION_SPRAY | Freq: Four times a day (QID) | RESPIRATORY_TRACT | 0 refills | Status: DC | PRN

## 2018-06-20 MED FILL — VENTOLIN HFA 90 MCG INHALER: 108 (90 BAS | 25 days supply | Qty: 18 | Fill #0

## 2018-06-20 MED FILL — PROMETHAZINE W/DM SYRUP: 6.25-15 | 7 days supply | Qty: 140 | Fill #0

## 2018-06-20 MED FILL — predniSONE 10 MG TABS: 10 | 6 days supply | Qty: 21 | Fill #0

## 2018-06-20 MED FILL — BENZONATATE 200 MG CAPS: 200 | 10 days supply | Qty: 30 | Fill #0

## 2018-06-20 NOTE — Patient Instructions (Signed)
Acute Bronchitis, Adult -Take medication as prescribed. -Ibuprofen or Tylenol for pain, fever, or general discomfort. -Increase fluids. -Sleep elevated on at least 2 pillows at bedtime to help with cough. -Use a humidifier or vaporizer when at home and during sleep to help with cough. -May use a teaspoon of honey or over-the-counter cough drops to help with cough. -Follow-up if symptoms do not improve.  Acute bronchitis is sudden (acute) swelling of the air tubes (bronchi) in the lungs. Acute bronchitis causes these tubes to fill with mucus, which can make it hard to breathe. It can also cause coughing or wheezing. In adults, acute bronchitis usually goes away within 2 weeks. A cough caused by bronchitis may last up to 3 weeks. Smoking, allergies, and asthma can make the condition worse. Repeated episodes of bronchitis may cause further lung problems, such as chronic obstructive pulmonary disease (COPD). What are the causes? This condition can be caused by germs and by substances that irritate the lungs, including:  Cold and flu viruses. This condition is most often caused by the same virus that causes a cold.  Bacteria.  Exposure to tobacco smoke, dust, fumes, and air pollution.  What increases the risk? This condition is more likely to develop in people who:  Have close contact with someone with acute bronchitis.  Are exposed to lung irritants, such as tobacco smoke, dust, fumes, and vapors.  Have a weak immune system.  Have a respiratory condition such as asthma.  What are the signs or symptoms? Symptoms of this condition include:  A cough.  Coughing up clear, yellow, or green mucus.  Wheezing.  Chest congestion.  Shortness of breath.  A fever.  Body aches.  Chills.  A sore throat.  How is this diagnosed? This condition is usually diagnosed with a physical exam. During the exam, your health care provider may order tests, such as chest X-rays, to rule out other  conditions. He or she may also:  Test a sample of your mucus for bacterial infection.  Check the level of oxygen in your blood. This is done to check for pneumonia.  Do a chest X-ray or lung function testing to rule out pneumonia and other conditions.  Perform blood tests.  Your health care provider will also ask about your symptoms and medical history. How is this treated? Most cases of acute bronchitis clear up over time without treatment. Your health care provider may recommend:  Drinking more fluids. Drinking more makes your mucus thinner, which may make it easier to breathe.  Taking a medicine for a fever or cough.  Taking an antibiotic medicine.  Using an inhaler to help improve shortness of breath and to control a cough.  Using a cool mist vaporizer or humidifier to make it easier to breathe.  Follow these instructions at home: Medicines  Take over-the-counter and prescription medicines only as told by your health care provider.  If you were prescribed an antibiotic, take it as told by your health care provider. Do not stop taking the antibiotic even if you start to feel better. General instructions  Get plenty of rest.  Drink enough fluids to keep your urine clear or pale yellow.  Avoid smoking and secondhand smoke. Exposure to cigarette smoke or irritating chemicals will make bronchitis worse. If you smoke and you need help quitting, ask your health care provider. Quitting smoking will help your lungs heal faster.  Use an inhaler, cool mist vaporizer, or humidifier as told by your health care provider.  Keep all follow-up visits as told by your health care provider. This is important. How is this prevented? To lower your risk of getting this condition again:  Wash your hands often with soap and water. If soap and water are not available, use hand sanitizer.  Avoid contact with people who have cold symptoms.  Try not to touch your hands to your mouth, nose, or  eyes.  Make sure to get the flu shot every year.  Contact a health care provider if:  Your symptoms do not improve in 2 weeks of treatment. Get help right away if:  You cough up blood.  You have chest pain.  You have severe shortness of breath.  You become dehydrated.  You faint or keep feeling like you are going to faint.  You keep vomiting.  You have a severe headache.  Your fever or chills gets worse. This information is not intended to replace advice given to you by your health care provider. Make sure you discuss any questions you have with your health care provider. Document Released: 08/23/2004 Document Revised: 02/08/2016 Document Reviewed: 01/04/2016 Elsevier Interactive Patient Education  [employer].

## 2018-06-20 NOTE — Progress Notes (Signed)
Subjective:     Teresa Wong is a 42 y.o. female here for evaluation of a cough.  The cough is non-productive, without wheezing, dyspnea or hemoptysis and is aggravated by nothing. Onset of symptoms was 4 days ago, unchanged since that time.  Associated symptoms include chest tightness. Patient does not have a history of asthma. Patient has not had recent travel. Patient does not have a history of smoking.   The following portions of the patient's history were reviewed and updated as appropriate: allergies, current medications and past medical history.  Review of Systems Constitutional: positive for fatigue, negative for anorexia, chills, fevers and malaise Eyes: negative Ears, nose, mouth, throat, and face: positive for "scratchy throat", negative for ear drainage, earaches, nasal congestion and sore throat Respiratory: positive for cough, negative for asthma, chronic bronchitis, dyspnea on exertion, sputum, stridor and wheezing Cardiovascular: negative Gastrointestinal: negative Neurological: negative     Objective:   BP 115/88 (BP Location: Right Arm, Patient Position: Sitting, Cuff Size: Normal)   Pulse 86   Temp 98.6 F (37 C) (Oral)   Resp 18   Wt 229 lb 6.4 oz (104.1 kg)   SpO2 98%   BMI 34.88 kg/m  General appearance: alert, cooperative, fatigued and no distress Head: Normocephalic, without obvious abnormality, atraumatic Eyes: conjunctivae/corneas clear. PERRL, EOM's intact. Fundi benign. Ears: normal TM's and external ear canals both ears Nose: Nares normal. Septum midline. Mucosa normal. No drainage or sinus tenderness. Throat: lips, mucosa, and tongue normal; teeth and gums normal Breasts: normal appearance, no masses or tenderness Heart: regular rate and rhythm, S1, S2 normal, no murmur, click, rub or gallop Abdomen: soft, non-tender; bowel sounds normal; no masses,  no organomegaly Pulses: 2+ and symmetric Skin: Skin color, texture, turgor normal. No rashes or  lesions Lymph nodes: cervical nodes normal Neurologic: Grossly normal    Assessment:    Acute Bronchitis    Plan:   Exam findings, diagnosis etiology and medication use and indications reviewed with patient. Follow- Up and discharge instructions provided. No emergent/urgent issues found on exam.  Discussed with patient that this was definitely a viral infection due to the presentation of her symptoms and that patient would best be served with symptomatic treatment..  There was no fever, no wheezing, or purulent sputum production.  The patient also does not display any other symptoms other upper respiratory infection.  Informed patient that cough could persist for 2 to 3 weeks after she may begin to feel better.  Patient education was provided. Patient verbalized understanding of information provided and agrees with plan of care (POC), all questions answered. The patient is advised to call or return to clinic if condition does not see an improvement in symptoms, or to seek the care of the closest emergency department if condition worsens with the above plan.   1. Acute bronchitis, unspecified organism  - predniSONE (STERAPRED UNI-PAK 21 TAB) 10 MG (21) TBPK tablet; Take as directed.  Dispense: 21 tablet; Refill: 0 - promethazine-dextromethorphan (PROMETHAZINE-DM) 6.25-15 MG/5ML syrup; Take 5 mLs by mouth 4 (four) times daily as needed for up to 7 days for cough.  Dispense: 140 mL; Refill: 0 - benzonatate (TESSALON) 200 MG capsule; Take 1 capsule (200 mg total) by mouth 3 (three) times daily as needed for up to 10 days for cough.  Dispense: 30 capsule; Refill: 0 - albuterol (PROVENTIL HFA;VENTOLIN HFA) 108 (90 Base) MCG/ACT inhaler; Inhale 2 puffs into the lungs every 6 (six) hours as needed for up to  10 days.  Dispense: 1 Inhaler; Refill: 0 --Take medication as prescribed. -Ibuprofen or Tylenol for pain, fever, or general discomfort. -Increase fluids. -Sleep elevated on at least 2 pillows at  bedtime to help with cough. -Use a humidifier or vaporizer when at home and during sleep to help with cough. -May use a teaspoon of honey or over-the-counter cough drops to help with cough. -Follow-up if symptoms do not improve.

## 2018-10-22 ENCOUNTER — Encounter: Payer: Self-pay | Admitting: Family Medicine

## 2018-10-23 ENCOUNTER — Other Ambulatory Visit: Payer: Self-pay

## 2018-10-23 ENCOUNTER — Ambulatory Visit (INDEPENDENT_AMBULATORY_CARE_PROVIDER_SITE_OTHER): Payer: 59 | Admitting: Family Medicine

## 2018-10-23 ENCOUNTER — Encounter: Payer: Self-pay | Admitting: Family Medicine

## 2018-10-23 DIAGNOSIS — F339 Major depressive disorder, recurrent, unspecified: Secondary | ICD-10-CM

## 2018-10-23 MED ORDER — VENLAFAXINE HCL ER 37.5 MG PO CP24: ORAL_CAPSULE | ORAL | 0 refills | Status: DC

## 2018-10-23 NOTE — Patient Instructions (Signed)
Telephone encounter.

## 2018-10-23 NOTE — Progress Notes (Signed)
Chief Complaint  Patient presents with  . Anxiety    Pt took herself off effexor x3 months ago and realizes she does need it and would like this started back       Virtual Visit via Telephone Note  I connected with [name] on 10/23/18 at 10:30 AM EDT by telephone and verified that I am speaking with the correct person using two identifiers.   I discussed the limitations, risks, security and privacy concerns of performing an evaluation and management service by telephone and the availability of in person appointments. I also discussed with the patient that there may be a patient responsible charge related to this service. The patient expressed understanding and agreed to proceed.   History of Present Illness:    Observations/Objective: Gen:  No acute distress.  Neuro: Alert. Oriented.  Psych: Normal affect and demeanor. Normal speech. Normal thought content and judgment.   Depression screen [facility] 2/9 10/23/2018 02/03/2018 12/25/2017 05/27/2017  Decreased Interest 3 0 2 0  Down, Depressed, Hopeless 3 0 2 0  PHQ - 2 Score 6 0 4 0  Altered sleeping 1 1 0 -  Tired, decreased energy 1 1 2  -  Change in appetite 1 0 3 -  Feeling bad or failure about yourself  1 0 1 -  Trouble concentrating 2 0 2 -  Moving slowly or fidgety/restless 1 0 0 -  Suicidal thoughts 0 0 0 -  PHQ-9 Score 13 2 12  -  Difficult doing work/chores Somewhat difficult - - -   GAD 7 : Generalized Anxiety Score 10/23/2018 12/25/2017  Nervous, Anxious, on Edge 2 1  Control/stop worrying 2 2  Worry too much - different things 1 2  Trouble relaxing 2 2  Restless 0 0  Easily annoyed or irritable 1 2  Afraid - awful might happen 0 0  Total GAD 7 Score 8 9  Anxiety Difficulty Very difficult -    Assessment and Plan: Depression, recurrent (HCC) - recurrent after stopping medications.  - restart effexor taper to 75 mg. Instructions provided to her today. She will call in 3 weeks for refill and effexor 75 mg tab will  be refilled at that time for 3 months.    Follow Up Instructions:3 months, sooner if needed.     I discussed the assessment and treatment plan with the patient. The patient was provided an opportunity to ask questions and all were answered. The patient agreed with the plan and demonstrated an understanding of the instructions.   The patient was advised to call back or seek an in-person evaluation if the symptoms worsen or if the condition fails to improve as anticipated.  I provided 7 minutes of non-face-to-face time during this encounter.   Howard Pouch, DO

## 2018-10-24 MED FILL — VENLAFAXINE HCL ER 37.5 MG: 37.5 | 31 days supply | Qty: 54 | Fill #0

## 2018-11-21 ENCOUNTER — Encounter: Payer: Self-pay | Admitting: Family Medicine

## 2018-11-21 NOTE — Telephone Encounter (Signed)
My chart message sent:  Hello!   I am finishing the taper-up on the restart of my Effexor.I have been taking the 1m dose for almost 3 weeks now.I feel so much better!Thank you for helping me get restarted.I have about a weeks worth of the 75 mg capsules left from when I tapered off before.[facility]?Thank you again! Stay safe.   EmCinziaIs this okay to change to [id number] send to pharmacy?  Last filled 10/23/2018 LOV 10/23/2018  NOV not scheduled. Said to return in three months.  Please advise

## 2018-11-24 ENCOUNTER — Telehealth: Payer: Self-pay | Admitting: Family Medicine

## 2018-11-24 MED ORDER — VENLAFAXINE HCL ER 75 MG PO CP24: 75.0000 mg | ORAL_CAPSULE | Freq: Every day | ORAL | 1 refills | Status: DC

## 2018-11-24 MED FILL — VENLAFAXINE HCL ER 75 MG CA: 75 | 90 days supply | Qty: 90 | Fill #0

## 2018-11-24 NOTE — Telephone Encounter (Signed)
Please inform patient the following information: Called in her effexor at the 75 mg dose for. Follow up in 3 months.  Please make her aware

## 2018-11-24 NOTE — Telephone Encounter (Signed)
Pt was called and given information, she verbalized understanding  

## 2018-11-27 DIAGNOSIS — H52221 Regular astigmatism, right eye: Secondary | ICD-10-CM | POA: Diagnosis not present

## 2018-12-29 ENCOUNTER — Telehealth: Payer: Self-pay | Admitting: *Deleted

## 2018-12-29 NOTE — Telephone Encounter (Signed)
Called pt at [phone]. Mailbox full, unable to LVM.  Called husband on DPR at [phone]. LVM for him or wife to call about her appt on Wednesday with Dr. Felecia Shelling

## 2018-12-30 NOTE — Telephone Encounter (Signed)
Tried calling pt again at [phone]. Mailbox full.  Tried pt on [phone]. LVM for pt to call office.  Tried husband again at [phone]. LVM for him to have pt call office back about appt tomorrow.

## 2018-12-31 ENCOUNTER — Ambulatory Visit (INDEPENDENT_AMBULATORY_CARE_PROVIDER_SITE_OTHER): Payer: 59 | Admitting: Neurology

## 2018-12-31 ENCOUNTER — Other Ambulatory Visit: Payer: Self-pay

## 2018-12-31 ENCOUNTER — Encounter: Payer: Self-pay | Admitting: Neurology

## 2018-12-31 ENCOUNTER — Telehealth: Payer: Self-pay | Admitting: Neurology

## 2018-12-31 DIAGNOSIS — R202 Paresthesia of skin: Secondary | ICD-10-CM | POA: Diagnosis not present

## 2018-12-31 DIAGNOSIS — R200 Anesthesia of skin: Secondary | ICD-10-CM | POA: Diagnosis not present

## 2018-12-31 DIAGNOSIS — G373 Acute transverse myelitis in demyelinating disease of central nervous system: Secondary | ICD-10-CM | POA: Diagnosis not present

## 2018-12-31 NOTE — Telephone Encounter (Signed)
I called pt back. Advised it would be best for her to keep appt today and do virtual visit with Dr. Felecia Shelling if she is able to. She agreed to virtual visit with Dr. Felecia Shelling. Pt understands that although there may be some limitations with this type of visit, we will take all precautions to reduce any security or privacy concerns.  Pt understands that this will be treated like an in office visit and we will file with pt's insurance, and there may be a patient responsible charge related to this service.  Email: [email or web address]. confirmed she received while on the phone. I updated med list, pharmacy, allergies on file. Explained [email or web address] steps.

## 2018-12-31 NOTE — Addendum Note (Signed)
Addended by: Hope Pigeon on: 12/31/2018 09:33 AM   Modules accepted: Orders

## 2018-12-31 NOTE — Telephone Encounter (Signed)
Pt has called to inform she is out of town, she forgot about the appointment.  Pt will r/s once she has had her MRI

## 2018-12-31 NOTE — Telephone Encounter (Signed)
Noted  

## 2018-12-31 NOTE — Telephone Encounter (Signed)
Pt would like a call about when the order will be submitted for the MRI that she was told was needed before her next appointment.

## 2018-12-31 NOTE — Progress Notes (Signed)
[facility]  PATIENT: Teresa Wong DOB: 1976/03/30  REFERRING DOCTOR OR PCP:  Howard Pouch SOURCE: patient, notes from Dr. Raoul Pitch.  _________________________________   HISTORICAL  CHIEF COMPLAINT:  Chief Complaint  Patient presents with  . Other    transverse myelitis  . Numbness    HISTORY OF PRESENT ILLNESS:  Teresa Wong is a 43 y.o. woman with an episode of numbness in October 2018 due to thoracic transverse myelitis.  Update 12/31/2018: .Virtual Visit via Video Note I connected with [name] on 12/31/18 at  1:00 PM EDT by a video enabled telemedicine application and verified that I am speaking with the correct person.  I discussed the limitations of evaluation and management by telemedicine and the availability of in person appointments. The patient expressed understanding and agreed to proceed.  History of Present Illness: She is doing well and denies any new episodes of numbness, weakness, clumsiness or other neurologic symptoms.  The numbness that she experienced in October 2018 completely resolved with passage of time and after she received a couple days of steroids at that time.  Follow-up brain MRI 12/05/2017 was normal.  Earlier in the year the follow-up MRI of the thoracic spine performed 09/11/2017 showed the same lesions at T8-T9 and T10 that had been seen previously but they were smaller on the more recent study than the study from 2018.  There is no abnormal enhancement.  Observations/Objective: She is a well-developed well-nourished woman in no acute distress.  The head is normocephalic and atraumatic.  Sclera are anicteric.  Visible skin appears normal.  The neck has a good range of motion.   She is alert and fully oriented with fluent speech and good attention, knowledge and memory.  Extraocular muscles are intact.  Facial strength is normal.   She appears to have normal strength in the arms.  Rapid alternating movements and  finger-nose-finger are performed well.  Assessment and Plan: Transverse myelitis (HCC)  Numbness and tingling of both legs  1.   She had thoracic transverse myelitis in 2018 and there was also a second focus in the thoracic spinal cord.  MRI of the brain at that time and repeat MRI in 2019 were normal.  We need to recheck an MRI of the brain and spine to determine if there have been any new lesions.  If present, then she most likely has multiple sclerosis and we will need to get her started on a disease modifying therapy.  If additional year has gone by without any new lesions, then, MS is less likely. 2.  Continue vitamin D supplementation.  3.   She will return to see me as needed if there are any new or worsening neurologic symptoms.  Or based on the results of the MRI.   Follow Up Instructions: I discussed the assessment and treatment plan with the patient. The patient was provided an opportunity to ask questions and all were answered. The patient agreed with the plan and demonstrated an understanding of the instructions.    The patient was advised to call back or seek an in-person evaluation if the symptoms worsen or if the condition fails to improve as anticipated.  I provided 20 minutes of non-face-to-face time during this encounter.  _________________________________  Update 12/26/2017: She is doing well and denies any new episodes of numbness, weakness, clumsiness or other neurologic symptoms.  The numbness that she experienced in October 2018 has completely resolved with passage of time and after she received  a couple days of steroids at that time.  Follow-up brain MRI 12/05/2017 was normal.  Earlier in the year the follow-up MRI of the thoracic spine performed 09/11/2017 showed the same lesions at T8-T9 and T10 that had been seen previously but they were smaller on the more recent study than the study from 2018.  There is no abnormal enhancement.  We had a discussion about the  significance of the transverse myelitis.  It could be post vaccination as it occurred 3 or 4 weeks after a flu vaccine or postinfectious.  However, as she has evidence of 2 thoracic lesions and not just one, we need to also be concerned about initial presentation of multiple sclerosis.  Update 08/28/2017: She has had 2 episodes of severe numbness, one about 3 years ago and one about 3 months ago.    MRI of the thoracic spine 05/28/2017 showed a focus at T10 that mildly expanded the spinal cord and would be consistent with an acute transverse myelitis. She also had a smaller anterior spinal cord focus to the right at T8.   MRI of the brain showed a single nonspecific focus in the genu.     She received 2 days of IV Solu-Medrol and her symptoms improved. She has not had any numbness returning since that time. We had a long discussion today about the significance of the finding and I showed her the MRI's. She appears to have had 2 episodes of transverse myelitis. The focus in the brain is nonspecific. I can't rule out that this could be an early presentation of MS. I think the likelihood that the thoracic lesion represents a non-inflammatory transverse myelitis is very small but we discussed the necessity to repeat an MRI of the thoracic spine to make sure that this is not a tumor. We also discussed that the brain MRI will have to be repeated sometime later this year to make sure that she is not experiencing more lesions. If there has been progression, the likelihood that this could represent MS is higher and I would consider further evaluation and treatment.  From 05/23/2017: About a week ago, after sleeping on a sofa with her 69 yo daughter, she woke up with discomfort in the lower back and numbness from the buttocks down.   She thinks she was sleeping on her left side but a little bit twisted. Numbness is partial and she can feel heat/cold and sharp pain.   She has had a tingling with pins and needle  sensation that has not resolved.    She denies any difficulty with her gait. She can stand with her eyes closed. There is no clumsiness or weakness in the legs. She denies any difficulty with her bladder. She does not note any symptoms in the arms or face. There is no change in vision.  She saw Dr. Raoul Pitch and was placed on steroids as a slow taper from 60 mg down. Over the last couple days she feels that there is some improvement of the numbness in the buttocks but she continues to have the tingling lower down in the leg.  Currently the symptoms are from the upper thighs down.  She has not had another episode of numbness like this in the past. However, she has had intermittent lower back discomfort since 3 years ago. Shortly after her second child was born she lifted him up and had the onset of pain in the lower back. Since then, she would get episodes lasting a few days to  a week or so of further discomfort that would usually resolve with time and over the counter medications.  She denied recent infection but her daughter had a mild GI viral syndrome 1-2 days before her symptoms.    She has no recent problem with headaches.          REVIEW OF SYSTEMS: Constitutional: No fevers, chills, sweats, or change in appetite.   She has gained some weight over the last year. Eyes: No visual changes, double vision, eye pain Ear, nose and throat: No hearing loss, ear pain, nasal congestion, sore throat Cardiovascular: No chest pain, palpitations Respiratory: No shortness of breath at rest or with exertion.   No wheezes GastrointestinaI: No nausea, vomiting, diarrhea, abdominal pain, fecal incontinence Genitourinary: No dysuria, urinary retention or frequency.  No nocturia. Musculoskeletal: No neck pain, back pain Integumentary: No rash, pruritus, skin lesions Neurological: as above Psychiatric: No depression at this time.  No anxiety Endocrine: No palpitations, diaphoresis, change in appetite or  increased thirst Hematologic/Lymphatic: No anemia, purpura, petechiae. Allergic/Immunologic: No itchy/runny eyes, nasal congestion, recent allergic reactions, rashes  ALLERGIES: No Known Allergies  HOME MEDICATIONS:  Current Outpatient Medications:  .  cholecalciferol (VITAMIN D) 1000 units tablet, Take 1,000 Units by mouth daily., Disp: , Rfl:  .  cyanocobalamin 1000 MCG tablet, Take 1,000 mcg by mouth daily., Disp: , Rfl:  .  ibuprofen (ADVIL,MOTRIN) 400 MG tablet, Take 400 mg by mouth every 6 (six) hours as needed., Disp: , Rfl:  .  Multiple Vitamin (MULTIVITAMIN) tablet, Take 1 tablet by mouth daily., Disp: , Rfl:  .  venlafaxine XR (EFFEXOR-XR) 75 MG 24 hr capsule, Take 1 capsule (75 mg total) by mouth daily with breakfast. ., Disp: 90 capsule, Rfl: 1  PAST MEDICAL HISTORY: Past Medical History:  Diagnosis Date  . Abnormal Pap smear   . 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  . 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 History of LEEP (loop electrosurgical excision procedure) of cervix complicating pregnancy 4332  . Meningitis 1984  . Postpartum depression   . Pregnancy induced hypertension 2010, 2012   History - resolved - no problems with current pregnancy 2015    PAST SURGICAL HISTORY: Past Surgical History:  Procedure Laterality Date  . CERVICAL BIOPSY  W/ LOOP ELECTRODE EXCISION  2006  . CESAREAN SECTION N/A 11/26/2013   Procedure: CESAREAN SECTION;  Surgeon: Daria Pastures, MD;  Location: [facility];  Service: Obstetrics;  Laterality: N/A;  . CHOLECYSTECTOMY    . COLPOSCOPY  09/2004  . DILATION AND CURETTAGE OF UTERUS  11/2011,08/2010,12/2007,08/2007   x4  . DILATION AND EVACUATION  12/18/2011   Procedure: DILATATION AND EVACUATION;  Surgeon: Daria Pastures, MD;  Location: [facility];  Service: Gynecology;  Laterality: N/A;  . LAPAROSCOPIC CHOLECYSTECTOMY SINGLE PORT N/A 09/28/2017   Procedure: LAPAROSCOPIC CHOLECYSTECTOMY SINGLE SITE WITH  CHOLANGIOGRAM;  Surgeon: Michael Boston, MD;  Location: [facility];  Service: General;  Laterality: N/A;  . TUBAL LIGATION Bilateral 11/26/2013   Procedure: BILATERAL TUBAL LIGATION;  Surgeon: Daria Pastures, MD;  Location: [facility];  Service: Obstetrics;  Laterality: Bilateral;  . WISDOM TOOTH EXTRACTION      FAMILY HISTORY: Family History  Problem Relation Age of Onset  . Hypertension Mother   . Hyperlipidemia Mother   . CAD Father 61       Bypass surgery 1.5 yrs ago   . Hyperlipidemia Father   .  Cancer Father        bladder  . Bladder Cancer Father   . Diverticulitis Father   . Heart disease Maternal Grandfather   . Colon cancer Maternal Grandmother 47  . Miscarriages / Stillbirths Paternal Grandmother     SOCIAL HISTORY:  Social History   Socioeconomic History  . Marital status: Married    Spouse name: Not on file  . Number of children: 2  . Years of education: some colle  . Highest education level: Not on file  Occupational History  . Not on file  Social Needs  . Financial resource strain: Not on file  . Food insecurity:    Worry: Not on file    Inability: Not on file  . Transportation needs:    Medical: Not on file    Non-medical: Not on file  Tobacco Use  . Smoking status: Never Smoker  . Smokeless tobacco: Never Used  Substance and Sexual Activity  . Alcohol use: No  . Drug use: No  . Sexual activity: Yes    Birth control/protection: Surgical    Comment: pregnant   Lifestyle  . Physical activity:    Days per week: Not on file    Minutes per session: Not on file  . Stress: Not on file  Relationships  . Social connections:    Talks on phone: Not on file    Gets together: Not on file    Attends religious service: Not on file    Active member of club or organization: Not on file    Attends meetings of clubs or organizations: Not on file    Relationship status: Not on file  . Intimate partner violence:    Fear of current or ex partner: Not on file     Emotionally abused: Not on file    Physically abused: Not on file    Forced sexual activity: Not on file  Other Topics Concern  . Not on file  Social History Narrative   [name] lives with her husband & 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     PHYSICAL EXAM  There were no vitals filed for this visit.  There is no height or weight on file to calculate BMI.   General: The patient is well-developed and well-nourished and in no acute distress   Neurologic Exam  Mental status: The patient is alert and oriented x 3 at the time of the examination. The patient has apparent normal recent and remote memory, with an apparently normal attention span and concentration ability.   Speech is normal.  Cranial nerves: Extraocular movements are full.  Facial strength and sensation is normal.  The trapezius has normal strength.  The tongue is midline, and the patient has symmetric elevation of the soft palate. Mild reduced right hearing..  Motor:  Muscle bulk is normal.   Tone is normal.  Strength is 5/5 in the arms and legs..   Sensory: There is intact sensation to touch, temperature and vibration in the arms and legs.  Coordination: Cerebellar testing reveals good finger-nose-finger and heel-to-shin bilaterally.  Gait and station: Station is normal.   The gait and tandem gait are normal.. Romberg is negative.   Reflexes: Deep tendon reflexes are symmetric and normal in the arms and legs.  There is no spread at the knees or clonus at the ankles (as had been present initially)     DIAGNOSTIC DATA (LABS, IMAGING, TESTING) - I reviewed patient records, labs, notes, testing and imaging  myself where available.  Lab Results  Component Value Date   WBC 6.0 12/25/2017   HGB 13.0 12/25/2017   HCT 38.2 12/25/2017   MCV 85.3 12/25/2017   PLT 364.0 12/25/2017      Component Value Date/Time   NA 138 12/25/2017 0942   K 4.0 12/25/2017 0942   CL 103 12/25/2017 0942   CO2 26 12/25/2017 0942    GLUCOSE 88 12/25/2017 0942   BUN 11 12/25/2017 0942   CREATININE 0.52 12/25/2017 0942   CREATININE 0.53 05/20/2017 1504   CALCIUM 9.2 12/25/2017 0942   PROT 7.1 12/25/2017 0942   ALBUMIN 4.4 12/25/2017 0942   AST 11 12/25/2017 0942   ALT 11 12/25/2017 0942   ALKPHOS 62 12/25/2017 0942   BILITOT 0.5 12/25/2017 0942   GFRNONAA >60 09/27/2017 2235   GFRAA >60 09/27/2017 2235   Lab Results  Component Value Date   CHOL 201 (H) 12/25/2017   HDL 50.00 12/25/2017   LDLCALC 133 (H) 12/25/2017   TRIG 92.0 12/25/2017   CHOLHDL 4 12/25/2017   Lab Results  Component Value Date   HGBA1C 5.4 12/25/2017   Lab Results  Component Value Date   VITAMINB12 515 12/25/2017   Lab Results  Component Value Date   TSH 1.22 12/25/2017      A. Felecia Shelling, MD, [facility] 02/04/4783, 1:28 PM Certified in Neurology, Clinical Neurophysiology, Sleep Medicine, Pain Medicine and Neuroimaging  [facility] [address], [place], [place] [address] [phone]

## 2019-01-05 ENCOUNTER — Telehealth: Payer: Self-pay | Admitting: Neurology

## 2019-01-05 NOTE — Telephone Encounter (Signed)
no to the covid-19 questions MR Brain wo contrast, MR Cervical spine wo contrast & MR Thoracic spine wo contrast Dr. Scot Jun UMR Auth: [facility] Ref # [id number]. Patient is scheduled at GI for 01/13/19.

## 2019-01-13 ENCOUNTER — Ambulatory Visit: Payer: 59

## 2019-01-13 ENCOUNTER — Other Ambulatory Visit: Payer: Self-pay

## 2019-01-13 DIAGNOSIS — G373 Acute transverse myelitis in demyelinating disease of central nervous system: Secondary | ICD-10-CM | POA: Diagnosis not present

## 2019-01-13 DIAGNOSIS — R200 Anesthesia of skin: Secondary | ICD-10-CM | POA: Diagnosis not present

## 2019-01-13 DIAGNOSIS — R202 Paresthesia of skin: Secondary | ICD-10-CM | POA: Diagnosis not present

## 2019-01-14 ENCOUNTER — Telehealth: Payer: Self-pay | Admitting: *Deleted

## 2019-01-14 NOTE — Telephone Encounter (Signed)
Called and spoke with pt about results per Dr. Felecia Shelling note. Pt verbalized understanding and appreciation for call.

## 2019-01-14 NOTE — Telephone Encounter (Signed)
-----   Message from Britt Bottom, MD sent at 01/13/2019  4:45 PM EDT ----- Please let her know that I took a look at the MRIs of the brain and cervical and thoracic spine.  The MRI of the thoracic spine shows the old transverse myelitis at the T10 level.  The second spot in the thoracic spine that had been seen in the past is not clearly seen on the current scan.  The brain and cervical spinal cord are normal.  She does have some degenerative changes in the spine but nothing pushing on a nerve root or anything that looks new.  These results are good and make MS less likely.

## 2019-02-24 MED FILL — VENLAFAXINE HCL ER 75 MG CA: 75 | 90 days supply | Qty: 90 | Fill #1

## 2019-04-08 ENCOUNTER — Other Ambulatory Visit: Payer: Self-pay

## 2019-04-08 ENCOUNTER — Ambulatory Visit (INDEPENDENT_AMBULATORY_CARE_PROVIDER_SITE_OTHER): Payer: 59 | Admitting: Family Medicine

## 2019-04-08 ENCOUNTER — Other Ambulatory Visit: Payer: Self-pay | Admitting: Family Medicine

## 2019-04-08 ENCOUNTER — Encounter: Payer: Self-pay | Admitting: Family Medicine

## 2019-04-08 VITALS — BP 125/83 | HR 86 | Temp 98.1°F | Resp 18 | Ht 68.0 in | Wt 225.1 lb

## 2019-04-08 DIAGNOSIS — F339 Major depressive disorder, recurrent, unspecified: Secondary | ICD-10-CM | POA: Diagnosis not present

## 2019-04-08 DIAGNOSIS — E669 Obesity, unspecified: Secondary | ICD-10-CM

## 2019-04-08 DIAGNOSIS — Z131 Encounter for screening for diabetes mellitus: Secondary | ICD-10-CM

## 2019-04-08 DIAGNOSIS — E7800 Pure hypercholesterolemia, unspecified: Secondary | ICD-10-CM

## 2019-04-08 DIAGNOSIS — Z1231 Encounter for screening mammogram for malignant neoplasm of breast: Secondary | ICD-10-CM

## 2019-04-08 DIAGNOSIS — Z79899 Other long term (current) drug therapy: Secondary | ICD-10-CM | POA: Diagnosis not present

## 2019-04-08 DIAGNOSIS — E559 Vitamin D deficiency, unspecified: Secondary | ICD-10-CM

## 2019-04-08 DIAGNOSIS — G373 Acute transverse myelitis in demyelinating disease of central nervous system: Secondary | ICD-10-CM | POA: Diagnosis not present

## 2019-04-08 DIAGNOSIS — Z130 Encounter for screening for diseases of the blood and blood-forming organs and certain disorders involving the immune mechanism: Secondary | ICD-10-CM

## 2019-04-08 DIAGNOSIS — Z0000 Encounter for general adult medical examination without abnormal findings: Secondary | ICD-10-CM | POA: Diagnosis not present

## 2019-04-08 MED ORDER — VENLAFAXINE HCL ER 75 MG PO CP24: 75.0000 mg | ORAL_CAPSULE | Freq: Every day | ORAL | 1 refills | Status: DC

## 2019-04-08 NOTE — Patient Instructions (Signed)
Health Maintenance, Female Adopting a healthy lifestyle and getting preventive care are important in promoting health and wellness. Ask your health care provider about:  The right schedule for you to have regular tests and exams.  Things you can do on your own to prevent diseases and keep yourself healthy. What should I know about diet, weight, and exercise? Eat a healthy diet   Eat a diet that includes plenty of vegetables, fruits, low-fat dairy products, and lean protein.  Do not eat a lot of foods that are high in solid fats, added sugars, or sodium. Maintain a healthy weight Body mass index (BMI) is used to identify weight problems. It estimates body fat based on height and weight. Your health care provider can help determine your BMI and help you achieve or maintain a healthy weight. Get regular exercise Get regular exercise. This is one of the most important things you can do for your health. Most adults should:  Exercise for at least 150 minutes each week. The exercise should increase your heart rate and make you sweat (moderate-intensity exercise).  Do strengthening exercises at least twice a week. This is in addition to the moderate-intensity exercise.  Spend less time sitting. Even light physical activity can be beneficial. Watch cholesterol and blood lipids Have your blood tested for lipids and cholesterol at 41 years of age, then have this test every 5 years. Have your cholesterol levels checked more often if:  Your lipid or cholesterol levels are high.  You are older than 40 years of age.  You are at high risk for heart disease. What should I know about cancer screening? Depending on your health history and family history, you may need to have cancer screening at various ages. This may include screening for:  Breast cancer.  Cervical cancer.  Colorectal cancer.  Skin cancer.  Lung cancer. What should I know about heart disease, diabetes, and high blood  pressure? Blood pressure and heart disease  High blood pressure causes heart disease and increases the risk of stroke. This is more likely to develop in people who have high blood pressure readings, are of African descent, or are overweight.  Have your blood pressure checked: ? Every 3-5 years if you are 18-39 years of age. ? Every year if you are 40 years old or older. Diabetes Have regular diabetes screenings. This checks your fasting blood sugar level. Have the screening done:  Once every three years after age 40 if you are at a normal weight and have a low risk for diabetes.  More often and at a younger age if you are overweight or have a high risk for diabetes. What should I know about preventing infection? Hepatitis B If you have a higher risk for hepatitis B, you should be screened for this virus. Talk with your health care provider to find out if you are at risk for hepatitis B infection. Hepatitis C Testing is recommended for:  Everyone born from 1945 through 1965.  Anyone with known risk factors for hepatitis C. Sexually transmitted infections (STIs)  Get screened for STIs, including gonorrhea and chlamydia, if: ? You are sexually active and are younger than 41 years of age. ? You are older than 41 years of age and your health care provider tells you that you are at risk for this type of infection. ? Your sexual activity has changed since you were last screened, and you are at increased risk for chlamydia or gonorrhea. Ask your health care provider if   you are at risk.  Ask your health care provider about whether you are at high risk for HIV. Your health care provider may recommend a prescription medicine to help prevent HIV infection. If you choose to take medicine to prevent HIV, you should first get tested for HIV. You should then be tested every 3 months for as long as you are taking the medicine. Pregnancy  If you are about to stop having your period (premenopausal) and  you may become pregnant, seek counseling before you get pregnant.  Take 400 to 800 micrograms (mcg) of folic acid every day if you become pregnant.  Ask for birth control (contraception) if you want to prevent pregnancy. Osteoporosis and menopause Osteoporosis is a disease in which the bones lose minerals and strength with aging. This can result in bone fractures. If you are 65 years old or older, or if you are at risk for osteoporosis and fractures, ask your health care provider if you should:  Be screened for bone loss.  Take a calcium or vitamin D supplement to lower your risk of fractures.  Be given hormone replacement therapy (HRT) to treat symptoms of menopause. Follow these instructions at home: Lifestyle  Do not use any products that contain nicotine or tobacco, such as cigarettes, e-cigarettes, and chewing tobacco. If you need help quitting, ask your health care provider.  Do not use street drugs.  Do not share needles.  Ask your health care provider for help if you need support or information about quitting drugs. Alcohol use  Do not drink alcohol if: ? Your health care provider tells you not to drink. ? You are pregnant, may be pregnant, or are planning to become pregnant.  If you drink alcohol: ? Limit how much you use to 0-1 drink a day. ? Limit intake if you are breastfeeding.  Be aware of how much alcohol is in your drink. In the [place] one drink equals one 12 oz bottle of beer (355 mL), one 5 oz glass of wine (148 mL), or one 1 oz glass of hard liquor (44 mL). General instructions  Schedule regular health, dental, and eye exams.  Stay current with your vaccines.  Tell your health care provider if: ? You often feel depressed. ? You have ever been abused or do not feel safe at home. Summary  Adopting a healthy lifestyle and getting preventive care are important in promoting health and wellness.  Follow your health care provider's instructions about healthy  diet, exercising, and getting tested or screened for diseases.  Follow your health care provider's instructions on monitoring your cholesterol and blood pressure. This information is not intended to replace advice given to you by your health care provider. Make sure you discuss any questions you have with your health care provider. Document Released: 01/29/2011 Document Revised: 07/09/2018 Document Reviewed: 07/09/2018 Elsevier Patient Education  2020 [employer]  

## 2019-04-08 NOTE — Progress Notes (Signed)
Patient ID: [name], female  DOB: [birth year], 43 y.o.   MRN: [id number] Patient Care Team    Relationship Specialty Notifications Start End  Ma Hillock, DO PCP - General Family Medicine  03/04/15   Britt Bottom, MD Consulting Physician Neurology  09/28/17   Bobbye Charleston, MD Consulting Physician Obstetrics and Gynecology  12/25/17     Chief Complaint  Patient presents with  . Annual Exam    Not fasting. Oct 2020 mammogram scheduled/ Nov 2020 Pap smear.     Subjective:  Teresa Wong is a 43 y.o.  Female  present for CPE. All past medical history, surgical history, allergies, family history, immunizations, medications and social history were updated in the electronic medical record today. All recent labs, ED visits and hospitalizations within the last year were reviewed.  Depression with anxiety:  Restarted effexor in March. She is doing much better.  Prior note:Patient reports her husband has noticed she has become "more down" as of lately.  She feels she has been struggling with her depression for about 2 years, and it has slowly been progressing over the last year.  She reports she is not as outgoing as she used to be.  She is having some difficulty surrounding her potential diagnosis of Teresa.  She feels she is sleeping okay.  She does always feel tired.  She has a history of vitamin B and vitamin D deficiencies.  She is over eating.  She is having trouble concentrating.  PHQ 9 completed today as a score 12.  Anxiety assessment with a score of 9.  She did suffer from postpartum depression in the past, and had been on Prozac.  Vit d def: Taking supplement 5000u/d.   Health maintenance:  Colonoscopy: Colon cancer MGM (54), mom w/screen at 21 non-cancerous polyps only found.  Mammogram: No fhx. Has appt w/ Dr. Philis Pique in August. Encouraged SBE. Has scheduled 04/2019 Cervical cancer screening: last pap: 2014, completed by: Dr. Philis Pique. She has a PAP 05/2019  Immunizations: tdap UTD 2015, Influenza will be received at work (encouraged yearly). Infectious disease screening: HIV completed 2019 DEXA: N/A Assistive device: none Oxygen JJK:KXFG Patient has a Dental home. Hospitalizations/ED visits: reviewed   Depression screen [facility] 2/9 04/08/2019 10/23/2018 02/03/2018 12/25/2017 05/27/2017  Decreased Interest 0 3 0 2 0  Down, Depressed, Hopeless 0 3 0 2 0  PHQ - 2 Score 0 6 0 4 0  Altered sleeping 0 1 1 0 -  Tired, decreased energy 1 1 1 2  -  Change in appetite 0 1 0 3 -  Feeling bad or failure about yourself  0 1 0 1 -  Trouble concentrating 1 2 0 2 -  Moving slowly or fidgety/restless 0 1 0 0 -  Suicidal thoughts 0 0 0 0 -  PHQ-9 Score 2 13 2 12  -  Difficult doing work/chores Not difficult at all Somewhat difficult - - -   GAD 7 : Generalized Anxiety Score 10/23/2018 12/25/2017  Nervous, Anxious, on Edge 2 1  Control/stop worrying 2 2  Worry too much - different things 1 2  Trouble relaxing 2 2  Restless 0 0  Easily annoyed or irritable 1 2  Afraid - awful might happen 0 0  Total GAD 7 Score 8 9  Anxiety Difficulty Very difficult -    Immunization History  Administered Date(s) Administered  . Hepatitis B 11/24/2014, 03/04/2015  . Influenza-Unspecified 04/18/2017  . PPD Test 03/04/2015, 03/07/2015  .  Tdap 11/26/2013  . Varicella 11/24/2014, 03/04/2015    Past Medical History:  Diagnosis Date  . Abnormal Pap smear   . Acute on chronic cholecystitis s/p lap cholecystectomy 09/28/2017 09/28/2017  . 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  . 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 History of LEEP (loop electrosurgical excision procedure) of cervix complicating pregnancy 5993  . Meningitis 1984  . Numbness and tingling of both legs 05/21/2017  . Postpartum depression   . Pregnancy induced hypertension 2010, 2012   History - resolved - no problems with current pregnancy 2015   No Known Allergies Past  Surgical History:  Procedure Laterality Date  . CERVICAL BIOPSY  W/ LOOP ELECTRODE EXCISION  2006  . CESAREAN SECTION N/A 11/26/2013   Procedure: CESAREAN SECTION;  Surgeon: Daria Pastures, MD;  Location: [facility];  Service: Obstetrics;  Laterality: N/A;  . CHOLECYSTECTOMY    . COLPOSCOPY  09/2004  . DILATION AND CURETTAGE OF UTERUS  11/2011,08/2010,12/2007,08/2007   x4  . DILATION AND EVACUATION  12/18/2011   Procedure: DILATATION AND EVACUATION;  Surgeon: Daria Pastures, MD;  Location: [facility];  Service: Gynecology;  Laterality: N/A;  . LAPAROSCOPIC CHOLECYSTECTOMY SINGLE PORT N/A 09/28/2017   Procedure: LAPAROSCOPIC CHOLECYSTECTOMY SINGLE SITE WITH CHOLANGIOGRAM;  Surgeon: Michael Boston, MD;  Location: [facility];  Service: General;  Laterality: N/A;  . TUBAL LIGATION Bilateral 11/26/2013   Procedure: BILATERAL TUBAL LIGATION;  Surgeon: Daria Pastures, MD;  Location: [facility];  Service: Obstetrics;  Laterality: Bilateral;  . WISDOM TOOTH EXTRACTION     Family History  Problem Relation Age of Onset  . Hypertension Mother   . Hyperlipidemia Mother   . CAD Father 64       Bypass surgery 1.5 yrs ago   . Hyperlipidemia Father   . Cancer Father        bladder  . Bladder Cancer Father   . Diverticulitis Father   . Heart disease Maternal Grandfather   . Colon cancer Maternal Grandmother 72  . Miscarriages / Stillbirths Paternal Grandmother    Social History   Social History Narrative   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    Allergies as of 04/08/2019   No Known Allergies     Medication List       Accurate as of April 08, 2019  2:53 PM. If you have any questions, ask your nurse or doctor.        STOP taking these medications   ibuprofen 400 MG tablet Commonly known as: ADVIL Stopped by: Howard Pouch, DO     TAKE these medications   cyanocobalamin 1000 MCG tablet Take 1,000 mcg by mouth daily.   multivitamin tablet Take 1 tablet by  mouth daily.   venlafaxine XR 75 MG 24 hr capsule Commonly known as: EFFEXOR-XR Take 1 capsule (75 mg total) by mouth daily with breakfast. .   Vitamin D 125 MCG (5000 UT) Caps Take 1 capsule by mouth daily. What changed: Another medication with the same name was removed. Continue taking this medication, and follow the directions you see here. Changed by: Howard Pouch, DO       All past medical history, surgical history, allergies, family history, immunizations andmedications were updated in the EMR today and reviewed under the history and medication portions of their EMR.     ROS: 14 pt review of  systems performed and negative (unless mentioned in an HPI)  Objective: BP 125/83 (BP Location: Left Arm, Patient Position: Sitting, Cuff Size: Normal)   Pulse 86   Temp 98.1 F (36.7 C) (Temporal)   Resp 18   Ht 5' 8"  (1.727 m)   Wt 225 lb 2 oz (102.1 kg)   LMP 03/22/2019 (Exact Date)   SpO2 97%   BMI 34.23 kg/m  Gen: Afebrile. No acute distress. Nontoxic in appearance, well-developed, well-nourished,  Pleasant caucasian female. Obese.  HENT: AT. [place]. Bilateral TM visualized and normal in appearance, normal external auditory canal. MMM, no oral lesions, adequate dentition. Bilateral nares within normal limits. Throat without erythema, ulcerations or exudates. no Cough on exam, no hoarseness on exam. Eyes:Pupils Equal Round Reactive to light, Extraocular movements intact,  Conjunctiva without redness, discharge or icterus. Neck/lymp/endocrine: Supple,no lymphadenopathy, no thyromegaly CV: RRR no murmur, no edema, +2/4 P posterior tibialis pulses. no carotid bruits. No JVD. Chest: CTAB, no wheeze, rhonchi or crackles. normal Respiratory effort. good Air movement. Abd: Soft. obese. NTND. BS present. no Masses palpated. No hepatosplenomegaly. No rebound tenderness or guarding. Skin: no rashes, purpura or petechiae. Warm and well-perfused. Skin intact. Neuro/Msk:  Normal gait. PERLA. EOMi.  Alert. Oriented x3.  Cranial nerves II through XII intact. Muscle strength 5/5 upper/lower extremity. DTRs equal bilaterally. Psych: Normal affect, dress and demeanor. Normal speech. Normal thought content and judgment. .  No exam data present  Assessment/plan: SHENEIKA WALSTAD is a 43 y.o. female present for CPE. Depression, recurrent ([place]) - continue effexor 75 mg>> refilled today.  - TSH - f/u 6 mos.  Elevated LDL cholesterol level/Obesity (BMI 30-39.9) - diet and exercise.  - Comprehensive metabolic panel - Lipid panel - TSH Encounter for long-term (current) use of medications - Comprehensive metabolic panel Transverse myelitis ([place]) Following with neurology Vitamin D deficiency - continue supplement 5000u. If not in normal range this time, consider pth. - Vitamin D (25 hydroxy) Screening for deficiency anemia - CBC Diabetes mellitus screening - Hemoglobin A1c Encounter for preventive health exam: Patient was encouraged to exercise greater than 150 minutes a week. Patient was encouraged to choose a diet filled with fresh fruits and vegetables, and lean meats. AVS provided to patient today for education/recommendation on gender specific health and safety maintenance. Colonoscopy: Colon cancer MGM (18), mom w/screen at 16 non-cancerous polyps only found.  Mammogram: No fhx. Has appt w/ Dr. Philis Pique in August. Encouraged SBE. Has scheduled 04/2019 Cervical cancer screening: last pap: 2014, completed by: Dr. Philis Pique. She has a PAP 05/2019 Immunizations: tdap UTD 2015, Influenza will be received at work (encouraged yearly). Infectious disease screening: HIV completed 2019 DEXA: N/A   Return in about 1 year (around 04/07/2020) for CPE (30 min). 6 mos [facility]  Electronically signed by: Howard Pouch, DO [place]

## 2019-04-09 ENCOUNTER — Telehealth: Payer: Self-pay | Admitting: Family Medicine

## 2019-04-09 DIAGNOSIS — E785 Hyperlipidemia, unspecified: Secondary | ICD-10-CM

## 2019-04-09 LAB — COMPREHENSIVE METABOLIC PANEL
ALT: 16 U/L (ref 0–35)
AST: 13 U/L (ref 0–37)
Albumin: 4.2 g/dL (ref 3.5–5.2)
Alkaline Phosphatase: 72 U/L (ref 39–117)
BUN: 10 mg/dL (ref 6–23)
CO2: 29 mEq/L (ref 19–32)
Calcium: 9.4 mg/dL (ref 8.4–10.5)
Chloride: 103 mEq/L (ref 96–112)
Creatinine, Ser: 0.57 mg/dL (ref 0.40–1.20)
GFR: 116.95 mL/min (ref >60.00)
Glucose, Bld: 84 mg/dL (ref 70–99)
Potassium: 4.1 mEq/L (ref 3.5–5.1)
Sodium: 138 mEq/L (ref 135–145)
Total Bilirubin: 0.3 mg/dL (ref 0.2–1.2)
Total Protein: 6.8 g/dL (ref 6.0–8.3)

## 2019-04-09 LAB — CBC
HCT: 37.1 % (ref 36.0–46.0)
Hemoglobin: 12.6 g/dL (ref 12.0–15.0)
MCHC: 33.8 g/dL (ref 30.0–36.0)
MCV: 87.9 fl (ref 78.0–100.0)
Platelets: 358 10*3/uL (ref 150.0–400.0)
RBC: 4.23 Mil/uL (ref 3.87–5.11)
RDW: 13.8 % (ref 11.5–15.5)
WBC: 7.9 10*3/uL (ref 4.0–10.5)

## 2019-04-09 LAB — LIPID PANEL
Cholesterol: 235 mg/dL — ABNORMAL HIGH (ref 0–200)
HDL: 49.7 mg/dL (ref >39.00)
NonHDL: 185.26
Total CHOL/HDL Ratio: 5
Triglycerides: 208 mg/dL — ABNORMAL HIGH (ref 0.0–149.0)
VLDL: 41.6 mg/dL — ABNORMAL HIGH (ref 0.0–40.0)

## 2019-04-09 LAB — LDL CHOLESTEROL, DIRECT: Direct LDL: 161 mg/dL

## 2019-04-09 LAB — TSH: TSH: 0.99 u[IU]/mL (ref 0.35–4.50)

## 2019-04-09 LAB — VITAMIN D 25 HYDROXY (VIT D DEFICIENCY, FRACTURES): VITD: 29.11 ng/mL — ABNORMAL LOW (ref 30.00–100.00)

## 2019-04-09 LAB — HEMOGLOBIN A1C: Hgb A1c MFr Bld: 5.4 % (ref 4.6–6.5)

## 2019-04-09 NOTE — Telephone Encounter (Signed)
Called patient and she verbalized understanding. She would like to be put on a statin. She schedule a 3 month fasting appointment with you.

## 2019-04-09 NOTE — Telephone Encounter (Signed)
Please inform patient the following information: -Cell counts, liver, kidney, thyroid levels are all normal. -Vitamin D is close to normal she has been at 29.11.  Goal is above 30.  Make sure to continue vitamin D supplementation with food daily for better absorption. -A1c/diabetes screen is normal. -Her cholesterol panel she has a rather significant elevation in her "bad cholesterol ", which is the LDL.  This is not affected by fasting versus nonfasting state.  When LDL levels are greater than 160 and hers is 161 this time, a statin medication is recommended by the American Heart Association considering she also has a family history of heart disease. -Options are we could start a low-dose statin medication now versus her focusing on increasing her exercise greater than 150 minutes a week and making dietary changes to decrease her cholesterol.   -If she elects to start the statin, please advise and will call this in for her.  She will also need a follow-up appointment in 3 months with provider (fasting). -If she elects to work on diet and exercise.  Follow-up with provider in 6 months (fasting).   I would recommend a mediterranean diet.  A mediterranean diet is high in fruits, vegetables, whole grains, fish, chicken, nuts, healthy fats (olive oil or canola oil). Low fat dairy. There are many online resources and books on this diet. Limit butter, margarine, red meat and sweets.

## 2019-04-10 MED ORDER — ATORVASTATIN CALCIUM 20 MG PO TABS: ORAL_TABLET | ORAL | 1 refills | Status: DC

## 2019-04-10 NOTE — Telephone Encounter (Signed)
Pt was called and given information.

## 2019-04-10 NOTE — Addendum Note (Signed)
Addended by: Howard Pouch A on: 04/10/2019 07:32 AM   Modules accepted: Orders

## 2019-04-10 NOTE — Telephone Encounter (Signed)
Lipitor prescribed- best if taken in the evening after dinner or before bed.

## 2019-04-24 ENCOUNTER — Encounter

## 2019-04-24 MED FILL — ATORVASTATIN 20 MG TABLET: 20 | 90 days supply | Qty: 90 | Fill #0

## 2019-04-25 ENCOUNTER — Encounter: Payer: Self-pay | Admitting: Family Medicine

## 2019-04-27 NOTE — Telephone Encounter (Signed)
Flu has been documented in patients immunization hx

## 2019-05-21 ENCOUNTER — Ambulatory Visit
Admission: RE | Admit: 2019-05-21 | Discharge: 2019-05-21 | Disposition: A | Discharge status: Home / Self Care | Payer: 59 | Source: Ambulatory Visit | Attending: Family Medicine | Admitting: Family Medicine

## 2019-05-21 ENCOUNTER — Other Ambulatory Visit: Payer: Self-pay

## 2019-05-21 DIAGNOSIS — Z1231 Encounter for screening mammogram for malignant neoplasm of breast: Secondary | ICD-10-CM

## 2019-07-09 ENCOUNTER — Ambulatory Visit: Payer: 59 | Admitting: Family Medicine

## 2019-11-11 MED FILL — ATORVASTATIN 20 MG TABLET: 20 | 90 days supply | Qty: 90 | Fill #1

## 2019-11-11 MED FILL — VENLAFAXINE HCL ER 75 MG CA: 75 | 90 days supply | Qty: 90 | Fill #0

## 2019-11-13 ENCOUNTER — Telehealth: Payer: 59 | Admitting: Emergency Medicine

## 2019-11-13 DIAGNOSIS — J309 Allergic rhinitis, unspecified: Secondary | ICD-10-CM | POA: Diagnosis not present

## 2019-11-13 NOTE — Progress Notes (Signed)
E visit for Allergic Rhinitis We are sorry that you are not feeling well.  Here is how we plan to help!  Based on what you have shared with me it looks like you have Allergic Rhinitis.  Rhinitis is when a reaction occurs that causes nasal congestion, runny nose, sneezing, and itching.  Most types of rhinitis are caused by an inflammation and are associated with symptoms in the eyes ears or throat. There are several types of rhinitis.  The most common are acute rhinitis, which is usually caused by a viral illness, allergic or seasonal rhinitis, and nonallergic or year-round rhinitis.  Nasal allergies occur certain times of the year.  Allergic rhinitis is caused when allergens in the air trigger the release of histamine in the body.  Histamine causes itching, swelling, and fluid to build up in the fragile linings of the nasal passages, sinuses and eyelids.  An itchy nose and clear discharge are common.  I recommend continuing with Claritin.  You might try switching this to Zyrtec or Xyzal and might get better relief.  Regarding the eyes, I would recommend discontinuing all eye drops.  You can get conjunctivitis medicamentosa from using eye drops too much.  Sometimes all it takes is stopping everything that you are putting in your eyes and giving them a chance to recover.      HOME CARE:   You can use an over-the-counter saline nasal spray as needed  Avoid areas where there is heavy dust, mites, or molds  Stay indoors on windy days during the pollen season  Keep windows closed in home, at least in bedroom; use air conditioner.  Use high-efficiency house air filter  Keep windows closed in car, turn AC on re-circulate  Avoid playing out with dog during pollen season  GET HELP RIGHT AWAY IF:   If your symptoms do not improve within 10 days  You become short of breath  You develop yellow or green discharge from your nose for over 3 days  You have coughing fits  MAKE SURE  YOU:   Understand these instructions  Will watch your condition  Will get help right away if you are not doing well or get worse  Thank you for choosing an e-visit. Your e-visit answers were reviewed by a board certified advanced clinical practitioner to complete your personal care plan. Depending upon the condition, your plan could have included both over the counter or prescription medications. Please review your pharmacy choice. Be sure that the pharmacy you have chosen is open so that you can pick up your prescription now.  If there is a problem you may message your provider in [place] to have the prescription routed to another pharmacy. Your safety is important to [place]. If you have drug allergies check your prescription carefully.  For the next 24 hours, you can use MyChart to ask questions about today's visit, request a non-urgent call back, or ask for a work or school excuse from your e-visit provider. You will get an email in the next two days asking about your experience. I hope that your e-visit has been valuable and will speed your recovery.   Approximately 5 minutes was used in reviewing the patient's chart, questionnaire, prescribing medications, and documentation.

## 2020-03-25 MED FILL — VENLAFAXINE HCL ER 75 MG CA: 75 | 90 days supply | Qty: 90 | Fill #1

## 2020-04-07 ENCOUNTER — Other Ambulatory Visit: Payer: Self-pay

## 2020-04-07 NOTE — Telephone Encounter (Signed)
Attempted to contact pt to schedule OV before med refill. Left VM for CB.

## 2020-08-16 ENCOUNTER — Other Ambulatory Visit: Payer: Self-pay | Admitting: Family Medicine

## 2020-08-16 DIAGNOSIS — Z1231 Encounter for screening mammogram for malignant neoplasm of breast: Secondary | ICD-10-CM

## 2020-08-30 ENCOUNTER — Other Ambulatory Visit (HOSPITAL_COMMUNITY): Payer: Self-pay | Admitting: Internal Medicine

## 2020-08-30 DIAGNOSIS — R41840 Attention and concentration deficit: Secondary | ICD-10-CM | POA: Diagnosis not present

## 2020-08-30 DIAGNOSIS — F419 Anxiety disorder, unspecified: Secondary | ICD-10-CM | POA: Diagnosis not present

## 2020-08-30 MED FILL — VYVANSE 20 MG CAPSULE: 20 | 30 days supply | Qty: 30 | Fill #0

## 2020-09-01 DIAGNOSIS — Z1231 Encounter for screening mammogram for malignant neoplasm of breast: Secondary | ICD-10-CM

## 2020-09-23 ENCOUNTER — Other Ambulatory Visit (HOSPITAL_COMMUNITY): Payer: Self-pay | Admitting: Internal Medicine

## 2020-09-30 MED FILL — VYVANSE 20 MG CAPSULE: 20 | 30 days supply | Qty: 30 | Fill #0

## 2020-10-18 ENCOUNTER — Other Ambulatory Visit (HOSPITAL_COMMUNITY): Payer: Self-pay | Admitting: Internal Medicine

## 2020-10-18 DIAGNOSIS — F902 Attention-deficit hyperactivity disorder, combined type: Secondary | ICD-10-CM | POA: Diagnosis not present

## 2020-10-18 DIAGNOSIS — F419 Anxiety disorder, unspecified: Secondary | ICD-10-CM | POA: Diagnosis not present

## 2020-10-18 DIAGNOSIS — Z79899 Other long term (current) drug therapy: Secondary | ICD-10-CM | POA: Diagnosis not present

## 2020-10-22 ENCOUNTER — Other Ambulatory Visit: Payer: Self-pay

## 2020-10-22 ENCOUNTER — Ambulatory Visit
Admission: RE | Admit: 2020-10-22 | Discharge: 2020-10-22 | Disposition: A | Discharge status: Home / Self Care | Payer: 59 | Source: Ambulatory Visit | Attending: Family Medicine | Admitting: Family Medicine

## 2020-10-22 DIAGNOSIS — Z1231 Encounter for screening mammogram for malignant neoplasm of breast: Secondary | ICD-10-CM | POA: Diagnosis not present

## 2020-11-01 ENCOUNTER — Other Ambulatory Visit (HOSPITAL_COMMUNITY): Payer: Self-pay

## 2020-11-01 MED FILL — Lisdexamfetamine Dimesylate Cap 20 MG: ORAL | 30 days supply | Qty: 30 | Fill #0 | Status: AC

## 2020-12-07 ENCOUNTER — Other Ambulatory Visit (HOSPITAL_COMMUNITY): Payer: Self-pay

## 2020-12-07 MED FILL — Lisdexamfetamine Dimesylate Cap 20 MG: ORAL | 30 days supply | Qty: 30 | Fill #0 | Status: AC

## 2020-12-08 ENCOUNTER — Other Ambulatory Visit (HOSPITAL_COMMUNITY): Payer: Self-pay

## 2021-01-09 ENCOUNTER — Encounter: Payer: Self-pay | Admitting: Family Medicine

## 2021-01-09 ENCOUNTER — Ambulatory Visit (INDEPENDENT_AMBULATORY_CARE_PROVIDER_SITE_OTHER): Payer: 59 | Admitting: Family Medicine

## 2021-01-09 ENCOUNTER — Other Ambulatory Visit: Payer: Self-pay

## 2021-01-09 VITALS — BP 121/80 | HR 92 | Temp 98.4°F | Ht 66.5 in | Wt 222.0 lb

## 2021-01-09 DIAGNOSIS — E669 Obesity, unspecified: Secondary | ICD-10-CM | POA: Diagnosis not present

## 2021-01-09 DIAGNOSIS — E785 Hyperlipidemia, unspecified: Secondary | ICD-10-CM

## 2021-01-09 DIAGNOSIS — E538 Deficiency of other specified B group vitamins: Secondary | ICD-10-CM | POA: Diagnosis not present

## 2021-01-09 DIAGNOSIS — Z8249 Family history of ischemic heart disease and other diseases of the circulatory system: Secondary | ICD-10-CM | POA: Diagnosis not present

## 2021-01-09 DIAGNOSIS — Z0000 Encounter for general adult medical examination without abnormal findings: Secondary | ICD-10-CM | POA: Diagnosis not present

## 2021-01-09 DIAGNOSIS — Z131 Encounter for screening for diabetes mellitus: Secondary | ICD-10-CM | POA: Diagnosis not present

## 2021-01-09 DIAGNOSIS — E559 Vitamin D deficiency, unspecified: Secondary | ICD-10-CM

## 2021-01-09 LAB — HEMOGLOBIN A1C: Hgb A1c MFr Bld: 5.6 % (ref 4.6–6.5)

## 2021-01-09 LAB — CBC WITH DIFFERENTIAL/PLATELET
Basophils Absolute: 0.1 10*3/uL (ref 0.0–0.1)
Basophils Relative: 1.3 % (ref 0.0–3.0)
Eosinophils Absolute: 0.2 10*3/uL (ref 0.0–0.7)
Eosinophils Relative: 2.7 % (ref 0.0–5.0)
HCT: 36.2 % (ref 36.0–46.0)
Hemoglobin: 11.9 g/dL — ABNORMAL LOW (ref 12.0–15.0)
Lymphocytes Relative: 40.4 % (ref 12.0–46.0)
Lymphs Abs: 2.2 10*3/uL (ref 0.7–4.0)
MCHC: 32.9 g/dL (ref 30.0–36.0)
MCV: 84.7 fl (ref 78.0–100.0)
Monocytes Absolute: 0.3 10*3/uL (ref 0.1–1.0)
Monocytes Relative: 5.4 % (ref 3.0–12.0)
Neutro Abs: 2.8 10*3/uL (ref 1.4–7.7)
Neutrophils Relative %: 50.2 % (ref 43.0–77.0)
Platelets: 355 10*3/uL (ref 150.0–400.0)
RBC: 4.27 Mil/uL (ref 3.87–5.11)
RDW: 15.4 % (ref 11.5–15.5)
WBC: 5.5 10*3/uL (ref 4.0–10.5)

## 2021-01-09 LAB — COMPREHENSIVE METABOLIC PANEL
ALT: 13 U/L (ref 0–35)
AST: 11 U/L (ref 0–37)
Albumin: 4.3 g/dL (ref 3.5–5.2)
Alkaline Phosphatase: 69 U/L (ref 39–117)
BUN: 8 mg/dL (ref 6–23)
CO2: 27 mEq/L (ref 19–32)
Calcium: 8.8 mg/dL (ref 8.4–10.5)
Chloride: 103 mEq/L (ref 96–112)
Creatinine, Ser: 0.52 mg/dL (ref 0.40–1.20)
GFR: 114.41 mL/min (ref >60.00)
Glucose, Bld: 79 mg/dL (ref 70–99)
Potassium: 4.1 mEq/L (ref 3.5–5.1)
Sodium: 139 mEq/L (ref 135–145)
Total Bilirubin: 0.4 mg/dL (ref 0.2–1.2)
Total Protein: 6.8 g/dL (ref 6.0–8.3)

## 2021-01-09 LAB — LIPID PANEL
Cholesterol: 194 mg/dL (ref 0–200)
HDL: 45.2 mg/dL (ref >39.00)
LDL Cholesterol: 120 mg/dL — ABNORMAL HIGH (ref 0–99)
NonHDL: 149.19
Total CHOL/HDL Ratio: 4
Triglycerides: 144 mg/dL (ref 0.0–149.0)
VLDL: 28.8 mg/dL (ref 0.0–40.0)

## 2021-01-09 LAB — TSH: TSH: 1.81 u[IU]/mL (ref 0.35–4.50)

## 2021-01-09 LAB — VITAMIN B12: Vitamin B-12: 239 pg/mL (ref 211–911)

## 2021-01-09 LAB — VITAMIN D 25 HYDROXY (VIT D DEFICIENCY, FRACTURES): VITD: 20.8 ng/mL — ABNORMAL LOW (ref 30.00–100.00)

## 2021-01-09 NOTE — Progress Notes (Signed)
This visit occurred during the SARS-CoV-2 public health emergency.  Safety protocols were in place, including screening questions prior to the visit, additional usage of staff PPE, and extensive cleaning of exam room while observing appropriate contact time as indicated for disinfecting solutions.    Patient ID: Teresa Wong, female  DOB: [birth year], 45 y.o.   MRN: [id number] Patient Care Team    Relationship Specialty Notifications Start End  Ma Hillock, DO PCP - General Family Medicine  03/04/15   Britt Bottom, MD Consulting Physician Neurology  09/28/17   Bobbye Charleston, MD Consulting Physician Obstetrics and Gynecology  12/25/17     Chief Complaint  Patient presents with   Annual Exam    Pt is fasting    Subjective:  Teresa Wong is a 45 y.o.  Female  present for CPE. All past medical history, surgical history, allergies, family history, immunizations, medications and social history were updated in the electronic medical record today. All recent labs, ED visits and hospitalizations within the last year were reviewed.  Health maintenance:  Colonoscopy: Colon cancer MGM (89), mom w/screen at 8 non-cancerous polyps only found. routine screen at 47. Recommend colonoscopy at 37.  Mammogram: No fhx. 09/2020- [place]- [facility]- Yianni Skilling Cervical cancer screening: last pap: 2020, completed by: Dr. Philis Pique. She has a PAP 05/2019 Immunizations: tdap UTD 2015, Influenza (encouraged yearly). Covid x3 Infectious disease screening: HIV completed 2019. Hep C completed DEXA: N/A Assistive device: none Oxygen EPP:IRJJ Patient has a Dental home. Hospitalizations/ED visits: reviewed  Depression screen [facility] 2/9 01/09/2021 04/08/2019 10/23/2018 02/03/2018 12/25/2017  Decreased Interest 0 0 3 0 2  Down, Depressed, Hopeless 0 0 3 0 2  PHQ - 2 Score 0 0 6 0 4  Altered sleeping 1 0 1 1 0  Tired, decreased energy 0 1 1 1 2   Change in appetite 0 0 1 0 3  Feeling bad or failure about yourself  0 0  1 0 1  Trouble concentrating 0 1 2 0 2  Moving slowly or fidgety/restless 0 0 1 0 0  Suicidal thoughts 0 0 0 0 0  PHQ-9 Score 1 2 13 2 12   Difficult doing work/chores - Not difficult at all Somewhat difficult - -   GAD 7 : Generalized Anxiety Score 10/23/2018 12/25/2017  Nervous, Anxious, on Edge 2 1  Control/stop worrying 2 2  Worry too much - different things 1 2  Trouble relaxing 2 2  Restless 0 0  Easily annoyed or irritable 1 2  Afraid - awful might happen 0 0  Total GAD 7 Score 8 9  Anxiety Difficulty Very difficult -    Immunization History  Administered Date(s) Administered   Hepatitis B 11/24/2014, 03/04/2015   Influenza-Unspecified 04/18/2017, 04/25/2019   PFIZER(Purple Top)SARS-COV-2 Vaccination 07/22/2019, 08/13/2019, 04/29/2020   PPD Test 03/04/2015, 03/07/2015   Tdap 11/26/2013   Varicella 11/24/2014, 03/04/2015   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   Depression, recurrent ([place]) 12/25/2017   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  Heparin and folic acid   History of LEEP (loop electrosurgical excision procedure) of cervix complicating pregnancy 8841   Meningitis 1984   Numbness and tingling of both legs 05/21/2017   Postpartum depression    Pregnancy induced hypertension 2010, 2012   History - resolved -  no problems with current pregnancy 2015   No Known Allergies Past Surgical History:  Procedure Laterality Date   CERVICAL BIOPSY  W/ LOOP ELECTRODE EXCISION  2006   CESAREAN SECTION N/A 11/26/2013   Procedure: CESAREAN SECTION;  Surgeon: Daria Pastures, MD;  Location: [facility];  Service: Obstetrics;  Laterality: N/A;   CHOLECYSTECTOMY     COLPOSCOPY  09/2004   DILATION AND CURETTAGE OF UTERUS  11/2011,08/2010,12/2007,08/2007   x4   DILATION AND EVACUATION  12/18/2011   Procedure: DILATATION AND EVACUATION;   Surgeon: Daria Pastures, MD;  Location: [facility];  Service: Gynecology;  Laterality: N/A;   LAPAROSCOPIC CHOLECYSTECTOMY SINGLE PORT N/A 09/28/2017   Procedure: LAPAROSCOPIC CHOLECYSTECTOMY SINGLE SITE WITH CHOLANGIOGRAM;  Surgeon: Michael Boston, MD;  Location: [facility];  Service: General;  Laterality: N/A;   TUBAL LIGATION Bilateral 11/26/2013   Procedure: BILATERAL TUBAL LIGATION;  Surgeon: Daria Pastures, MD;  Location: [facility];  Service: Obstetrics;  Laterality: Bilateral;   WISDOM TOOTH EXTRACTION     Family History  Problem Relation Age of Onset   Hypertension Mother    Hyperlipidemia Mother    CAD Father 62       Bypass surgery 1.5 yrs ago    Hyperlipidemia Father    Cancer Father        bladder   Bladder Cancer Father    Diverticulitis Father    Heart disease Maternal Grandfather    Colon cancer Maternal Grandmother 36   Miscarriages / Stillbirths Paternal Grandmother    Social History   Social History Narrative   [name] lives with her husband & 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    Allergies as of 01/09/2021   No Known Allergies      Medication List        Accurate as of January 09, 2021  9:52 AM. If you have any questions, ask your nurse or doctor.          STOP taking these medications    atorvastatin 20 MG tablet Commonly known as: LIPITOR Stopped by: Howard Pouch, DO   venlafaxine XR 75 MG 24 hr capsule Commonly known as: EFFEXOR-XR Stopped by: Howard Pouch, DO       TAKE these medications    cyanocobalamin 1000 MCG tablet Take 1,000 mcg by mouth daily.   multivitamin tablet Take 1 tablet by mouth daily.   Vitamin D 125 MCG (5000 UT) Caps Take 1 capsule by mouth daily.   Vyvanse 20 MG capsule Generic drug: lisdexamfetamine TAKE 1 CAPSULE BY MOUTH ONCE DAILY. DUE 08/30/20.   Vyvanse 20 MG capsule Generic drug: lisdexamfetamine TAKE 1 CAPSULE BY MOUTH ONCE DAILY.   Vyvanse 20 MG capsule Generic drug: lisdexamfetamine TAKE 1  CAPSULE BY MOUTH ONCE DAILY   Vyvanse 20 MG capsule Generic drug: lisdexamfetamine 1 CAPSULE BY MOUTH DAILY; REFILL DUE 12/17/20   Vyvanse 20 MG capsule Generic drug: lisdexamfetamine Take 1 capsule by mouth daily.        All past medical history, surgical history, allergies, family history, immunizations andmedications were updated in the EMR today and reviewed under the history and medication portions of their EMR.     No results found for this or any previous visit (from the past 2160 hour(s)).  MM 3D SCREEN BREAST BILATERAL  Result Date: 10/25/2020 CLINICAL DATA:  Screening. EXAM: DIGITAL SCREENING BILATERAL MAMMOGRAM WITH TOMOSYNTHESIS AND CAD TECHNIQUE: Bilateral screening digital craniocaudal and mediolateral oblique mammograms were obtained. Bilateral screening digital breast  tomosynthesis was performed. The images were evaluated with computer-aided detection. COMPARISON:  Previous exam(s). ACR Breast Density Category a: The breast tissue is almost entirely fatty. FINDINGS: There are no findings suspicious for malignancy. The images were evaluated with computer-aided detection. IMPRESSION: No mammographic evidence of malignancy. A result letter of this screening mammogram will be mailed directly to the patient. RECOMMENDATION: Screening mammogram in one year. (Code:SM-B-01Y) BI-RADS CATEGORY  1: Negative. Electronically Signed   By: Everlean Alstrom M.D.   On: 10/25/2020 14:11     ROS: 14 pt review of systems performed and negative (unless mentioned in an HPI)  Objective: BP 121/80   Pulse 92   Temp 98.4 F (36.9 C) (Oral)   Ht 5' 6.5" (1.689 m)   Wt 222 lb (100.7 kg)   SpO2 100%   BMI 35.29 kg/m  Gen: Afebrile. No acute distress. Nontoxic in appearance, well-developed, well-nourished,  pleasant obese female HENT: AT. [place]. Bilateral TM visualized and normal in appearance, normal external auditory canal. MMM, no oral lesions, adequate dentition. Bilateral nares within  normal limits. Throat without erythema, ulcerations or exudates. no Cough on exam, no hoarseness on exam. Eyes:Pupils Equal Round Reactive to light, Extraocular movements intact,  Conjunctiva without redness, discharge or icterus. Neck/lymp/endocrine: Supple,no lymphadenopathy, no thyromegaly CV: RRR no murmur, no edema, +2/4 P posterior tibialis pulses.  Chest: CTAB, no wheeze, rhonchi or crackles. normal Respiratory effort. good Air movement. Abd: Soft. flat. NTND. BS present. no Masses palpated. No hepatosplenomegaly. No rebound tenderness or guarding. Skin: no rashes, purpura or petechiae. Warm and well-perfused. Skin intact. Neuro/Msk:  Normal gait. PERLA. EOMi. Alert. Oriented x3.  Cranial nerves II through XII intact. Muscle strength 5/5 upper/lower extremity. DTRs equal bilaterally. Psych: Normal affect, dress and demeanor. Normal speech. Normal thought content and judgment.   No results found.  Assessment/plan: Teresa Wong is a 45 y.o. female present for CPE  Hyperlipidemia LDL goal <130/Obesity (BMI 30-39.9)/Family history of heart disease - She had been on atorvastatin, she is ok with restarting if need be.  - discussed considering cardiac CT in the future (OOP99) - CBC with Differential/Platelet - Comprehensive metabolic panel - Lipid panel - TSH  Vitamin B12 deficiency (non anemic)/Vitamin D deficiency Supplementing both vit d and b12 qd - Vitamin B12 - VITAMIN D 25 Hydroxy (Vit-D Deficiency, Fractures) Diabetes mellitus screening - Hemoglobin A1c Routine general medical examination at a health care facility Colonoscopy: Colon cancer MGM (22), mom w/screen at 46 non-cancerous polyps only found. routine screen at 47. Recommend colonoscopy at 22.  Mammogram: No fhx. 09/2020- [place]- [facility]- Timotheus Salm Cervical cancer screening: last pap: 2020, completed by: Dr. Philis Pique. She has a PAP 05/2019 Immunizations: tdap UTD 2015, Influenza (encouraged yearly). Covid x3 Infectious  disease screening: HIV completed 2019. Hep C completed DEXA: N/A Patient was encouraged to exercise greater than 150 minutes a week. Patient was encouraged to choose a diet filled with fresh fruits and vegetables, and lean meats. AVS provided to patient today for education/recommendation on gender specific health and safety maintenance.  Return in about 1 year (around 01/09/2022) for CPE (30 min).  Orders Placed This Encounter  Procedures   CBC with Differential/Platelet   Comprehensive metabolic panel   Hemoglobin A1c   Lipid panel   TSH   VITAMIN D 25 Hydroxy (Vit-D Deficiency, Fractures)   Vitamin B12   No orders of the defined types were placed in this encounter.  Referral Orders  No referral(s) requested today  Electronically signed by: Howard Pouch, DO [place]

## 2021-01-09 NOTE — Patient Instructions (Signed)
Great to see you today.    If labs were collected, we will inform you of lab results once received either by echart message or telephone call.   - echart message- for normal results that have been seen by the patient already.   - telephone call: abnormal results or if patient has not viewed results in their echart.  Health Maintenance, Female Adopting a healthy lifestyle and getting preventive care are important in promoting health and wellness. Ask your health care provider about: The right schedule for you to have regular tests and exams. Things you can do on your own to prevent diseases and keep yourself healthy. What should I know about diet, weight, and exercise? Eat a healthy diet  Eat a diet that includes plenty of vegetables, fruits, low-fat dairy products, and lean protein. Do not eat a lot of foods that are high in solid fats, added sugars, or sodium.  Maintain a healthy weight Body mass index (BMI) is used to identify weight problems. It estimates body fat based on height and weight. Your health care provider can help determineyour BMI and help you achieve or maintain a healthy weight. Get regular exercise Get regular exercise. This is one of the most important things you can do for your health. Most adults should: Exercise for at least 150 minutes each week. The exercise should increase your heart rate and make you sweat (moderate-intensity exercise). Do strengthening exercises at least twice a week. This is in addition to the moderate-intensity exercise. Spend less time sitting. Even light physical activity can be beneficial. Watch cholesterol and blood lipids Have your blood tested for lipids and cholesterol at 43 years of age, then havethis test every 5 years. Have your cholesterol levels checked more often if: Your lipid or cholesterol levels are high. You are older than 43 years of age. You are at high risk for heart disease. What should I know about cancer  screening? Depending on your health history and family history, you may need to have cancer screening at various ages. This may include screening for: Breast cancer. Cervical cancer. Colorectal cancer. Skin cancer. Lung cancer. What should I know about heart disease, diabetes, and high blood pressure? Blood pressure and heart disease High blood pressure causes heart disease and increases the risk of stroke. This is more likely to develop in people who have high blood pressure readings, are of African descent, or are overweight. Have your blood pressure checked: Every 3-5 years if you are 43-51 years of age. Every year if you are 43 years old or older. Diabetes Have regular diabetes screenings. This checks your fasting blood sugar level. Have the screening done: Once every three years after age 43 if you are at a normal weight and have a low risk for diabetes. More often and at a younger age if you are overweight or have a high risk for diabetes. What should I know about preventing infection? Hepatitis B If you have a higher risk for hepatitis B, you should be screened for this virus. Talk with your health care provider to find out if you are at risk forhepatitis B infection. Hepatitis C Testing is recommended for: Everyone born from [birth year] through 1963. Anyone with known risk factors for hepatitis C. Sexually transmitted infections (STIs) Get screened for STIs, including gonorrhea and chlamydia, if: You are sexually active and are younger than 43 years of age. You are older than 43 years of age and your health care provider tells you that you are  at risk for this type of infection. Your sexual activity has changed since you were last screened, and you are at increased risk for chlamydia or gonorrhea. Ask your health care provider if you are at risk. Ask your health care provider about whether you are at high risk for HIV. Your health care provider may recommend a prescription medicine to  help prevent HIV infection. If you choose to take medicine to prevent HIV, you should first get tested for HIV. You should then be tested every 3 months for as long as you are taking the medicine. Pregnancy If you are about to stop having your period (premenopausal) and you may become pregnant, seek counseling before you get pregnant. Take 400 to 800 micrograms (mcg) of folic acid every day if you become pregnant. Ask for birth control (contraception) if you want to prevent pregnancy. Osteoporosis and menopause Osteoporosis is a disease in which the bones lose minerals and strength with aging. This can result in bone fractures. If you are 43 years old or older, or if you are at risk for osteoporosis and fractures, ask your health care provider if you should: Be screened for bone loss. Take a calcium or vitamin D supplement to lower your risk of fractures. Be given hormone replacement therapy (HRT) to treat symptoms of menopause. Follow these instructions at home: Lifestyle Do not use any products that contain nicotine or tobacco, such as cigarettes, e-cigarettes, and chewing tobacco. If you need help quitting, ask your health care provider. Do not use street drugs. Do not share needles. Ask your health care provider for help if you need support or information about quitting drugs. Alcohol use Do not drink alcohol if: Your health care provider tells you not to drink. You are pregnant, may be pregnant, or are planning to become pregnant. If you drink alcohol: Limit how much you use to 0-1 drink a day. Limit intake if you are breastfeeding. Be aware of how much alcohol is in your drink. In the [place] one drink equals one 12 oz bottle of beer (355 mL), one 5 oz glass of wine (148 mL), or one 1 oz glass of hard liquor (44 mL). General instructions Schedule regular health, dental, and eye exams. Stay current with your vaccines. Tell your health care provider if: You often feel depressed. You  have ever been abused or do not feel safe at home. Summary Adopting a healthy lifestyle and getting preventive care are important in promoting health and wellness. Follow your health care provider's instructions about healthy diet, exercising, and getting tested or screened for diseases. Follow your health care provider's instructions on monitoring your cholesterol and blood pressure. This information is not intended to replace advice given to you by your health care provider. Make sure you discuss any questions you have with your healthcare provider. Document Revised: 07/09/2018 Document Reviewed: 07/09/2018 Elsevier Patient Education  2022 [employer].

## 2021-01-10 ENCOUNTER — Telehealth: Payer: Self-pay | Admitting: Family Medicine

## 2021-01-10 ENCOUNTER — Encounter: Payer: Self-pay | Admitting: Family Medicine

## 2021-01-10 NOTE — Telephone Encounter (Signed)
Spoke with pt regarding labs and instructions.   

## 2021-01-10 NOTE — Telephone Encounter (Signed)
Please call patient Teresa Wong, kidney and thyroid function are normal Blood cell counts are stable.  Diabetes screening/A1c is normal  Cholesterol panel looks good- much better with an LDL of 120.   Vit d is two at 30.  Would encourage her to continue her vitamin D 5000 units daily, she may want to try to switch to the tablets instead of capsules to see if she would absorb that formation better.  Also take this with food. Vit b12 is also really low at 239.  I would encourage her to start the sublingual version of B12 1000 mcg daily.  If she is already taking a sublingual, then she should increase her dose.

## 2021-01-17 ENCOUNTER — Other Ambulatory Visit (HOSPITAL_COMMUNITY): Payer: Self-pay

## 2021-01-17 DIAGNOSIS — Z79899 Other long term (current) drug therapy: Secondary | ICD-10-CM | POA: Diagnosis not present

## 2021-01-17 DIAGNOSIS — F902 Attention-deficit hyperactivity disorder, combined type: Secondary | ICD-10-CM | POA: Diagnosis not present

## 2021-01-17 MED FILL — Lisdexamfetamine Dimesylate Cap 20 MG: ORAL | 30 days supply | Qty: 30 | Fill #0 | Status: AC

## 2021-01-18 ENCOUNTER — Encounter: Payer: Self-pay | Admitting: Family Medicine

## 2021-01-18 DIAGNOSIS — E785 Hyperlipidemia, unspecified: Secondary | ICD-10-CM

## 2021-01-18 DIAGNOSIS — E669 Obesity, unspecified: Secondary | ICD-10-CM

## 2021-01-18 DIAGNOSIS — Z8249 Family history of ischemic heart disease and other diseases of the circulatory system: Secondary | ICD-10-CM

## 2021-01-20 NOTE — Telephone Encounter (Signed)
I have placed the cardiac CT order for her.  Somebody should be reaching out to her to schedule.  It was placed for the new [facility] location on [address].  As for audiology, [facility] is a popular group in the area.

## 2021-02-11 ENCOUNTER — Telehealth: Payer: 59 | Admitting: Physician Assistant

## 2021-02-11 DIAGNOSIS — U071 COVID-19: Secondary | ICD-10-CM

## 2021-02-11 MED ORDER — PROMETHAZINE-DM 6.25-15 MG/5ML PO SYRP: 5.0000 mL | ORAL_SOLUTION | Freq: Four times a day (QID) | ORAL | 0 refills | Status: DC | PRN

## 2021-02-11 MED ORDER — BENZONATATE 100 MG PO CAPS: 100.0000 mg | ORAL_CAPSULE | Freq: Three times a day (TID) | ORAL | 0 refills | Status: DC | PRN

## 2021-02-11 MED ORDER — FLUTICASONE PROPIONATE 50 MCG/ACT NA SUSP
2.0000 | Freq: Every day | NASAL | 6 refills | Status: DC
Start: 2021-02-11 — End: 2022-03-07

## 2021-02-11 MED ORDER — ALBUTEROL SULFATE HFA 108 (90 BASE) MCG/ACT IN AERS: 2.0000 | INHALATION_SPRAY | Freq: Four times a day (QID) | RESPIRATORY_TRACT | 0 refills | Status: DC | PRN

## 2021-02-11 NOTE — Progress Notes (Signed)
E-Visit  for Positive Covid Test Result  We are sorry you are not feeling well. We are here to help!  You have tested positive for COVID-19, meaning that you were infected with the novel coronavirus and could give the virus to others.  It is vitally important that you stay home so you do not spread it to others.      Please continue isolation at home, for at least 10 days since the start of your symptoms and until you have had 24 hours with no fever (without taking a fever reducer) and with improving of symptoms.  If you have no symptoms but tested positive (or all symptoms resolve after 5 days and you have no fever) you can leave your house but continue to wear a mask around others for an additional 5 days. If you have a fever,continue to stay home until you have had 24 hours of no fever. Most cases improve 5-10 days from onset but we have seen a small number of patients who have gotten worse after the 10 days.  Please be sure to watch for worsening symptoms and remain taking the proper precautions.   Go to the nearest hospital ED for assessment if fever/cough/breathlessness are severe or illness seems like a threat to life.    The following symptoms may appear 2-14 days after exposure: Fever Cough Shortness of breath or difficulty breathing Chills Repeated shaking with chills Muscle pain Headache Sore throat New loss of taste or smell Fatigue Congestion or runny nose Nausea or vomiting Diarrhea  You have been enrolled in [place] for COVID-19. Daily you will receive a questionnaire within the [facility] website. Our COVID-19 response team will be monitoring your responses daily.  You can use medication such as prescription cough medication called Tessalon Perles 100 mg. You may take 1-2 capsules every 8 hours as needed for cough,  prescription inhaler called Albuterol MDI 90 mcg /actuation 2 puffs every 4 hours as needed for shortness of breath, wheezing, cough,  prescription cough medication called Phenergan DM 6.25 mg/15 mg. You make take one teaspoon / 5 ml every 4-6 hours as needed for cough, and prescription for Fluticasone nasal spray 2 sprays in each nostril one time per day  You may also take acetaminophen (Tylenol) as needed for fever.  HOME CARE: Only take medications as instructed by your medical team. Drink plenty of fluids and get plenty of rest. A steam or ultrasonic humidifier can help if you have congestion.   GET HELP RIGHT AWAY IF YOU HAVE EMERGENCY WARNING SIGNS.  Call 911 or proceed to your closest emergency facility if: You develop worsening high fever. Trouble breathing Bluish lips or face Persistent pain or pressure in the chest New confusion Inability to wake or stay awake You cough up blood. Your symptoms become more severe Inability to hold down food or fluids  This list is not all possible symptoms. Contact your medical provider for any symptoms that are severe or concerning to you.    Your e-visit answers were reviewed by a board certified advanced clinical practitioner to complete your personal care plan.  Depending on the condition, your plan could have included both over the counter or prescription medications.  If there is a problem please reply once you have received a response from your provider.  Your safety is important to [place].  If you have drug allergies check your prescription carefully.    You can use MyChart to ask questions about today's  visit, request a non-urgent call back, or ask for a work or school excuse for 24 hours related to this e-Visit. If it has been greater than 24 hours you will need to follow up with your provider, or enter a new e-Visit to address those concerns. You will get an e-mail in the next two days asking about your experience.  I hope that your e-visit has been valuable and will speed your recovery. Thank you for using e-visits.  I provided 6 minutes of non face-to-face time  during this encounter for chart review and documentation.

## 2021-02-15 DIAGNOSIS — H9041 Sensorineural hearing loss, unilateral, right ear, with unrestricted hearing on the contralateral side: Secondary | ICD-10-CM | POA: Diagnosis not present

## 2021-02-15 DIAGNOSIS — H938X1 Other specified disorders of right ear: Secondary | ICD-10-CM | POA: Diagnosis not present

## 2021-02-16 ENCOUNTER — Other Ambulatory Visit (HOSPITAL_COMMUNITY): Payer: Self-pay

## 2021-02-16 MED ORDER — VYVANSE 20 MG PO CAPS
20.0000 mg | ORAL_CAPSULE | Freq: Every day | ORAL | 0 refills | Status: DC
  Filled 2021-04-10: qty 30, 30d supply, fill #0

## 2021-02-16 MED ORDER — VYVANSE 20 MG PO CAPS
20.0000 mg | ORAL_CAPSULE | Freq: Every day | ORAL | 0 refills | Status: DC
  Filled 2021-02-16 – 2021-03-08 (×2): qty 30, 30d supply, fill #0

## 2021-02-16 MED ORDER — VYVANSE 20 MG PO CAPS
ORAL_CAPSULE | ORAL | 0 refills | Status: DC
  Filled 2021-05-19: qty 30, 30d supply, fill #0

## 2021-02-24 ENCOUNTER — Other Ambulatory Visit (HOSPITAL_COMMUNITY): Payer: Self-pay

## 2021-02-27 ENCOUNTER — Other Ambulatory Visit (HOSPITAL_COMMUNITY): Payer: Self-pay

## 2021-03-01 DIAGNOSIS — H9041 Sensorineural hearing loss, unilateral, right ear, with unrestricted hearing on the contralateral side: Secondary | ICD-10-CM | POA: Diagnosis not present

## 2021-03-07 ENCOUNTER — Other Ambulatory Visit: Payer: Self-pay | Admitting: Physician Assistant

## 2021-03-07 DIAGNOSIS — U071 COVID-19: Secondary | ICD-10-CM

## 2021-03-08 ENCOUNTER — Other Ambulatory Visit (HOSPITAL_COMMUNITY): Payer: Self-pay

## 2021-04-10 ENCOUNTER — Other Ambulatory Visit (HOSPITAL_COMMUNITY): Payer: Self-pay

## 2021-04-20 ENCOUNTER — Ambulatory Visit (HOSPITAL_BASED_OUTPATIENT_CLINIC_OR_DEPARTMENT_OTHER): Admission: RE | Admit: 2021-04-20 | Payer: 59 | Source: Ambulatory Visit

## 2021-05-08 DIAGNOSIS — Z79899 Other long term (current) drug therapy: Secondary | ICD-10-CM | POA: Diagnosis not present

## 2021-05-08 DIAGNOSIS — F902 Attention-deficit hyperactivity disorder, combined type: Secondary | ICD-10-CM | POA: Diagnosis not present

## 2021-05-19 ENCOUNTER — Other Ambulatory Visit (HOSPITAL_COMMUNITY): Payer: Self-pay

## 2021-06-14 ENCOUNTER — Other Ambulatory Visit (HOSPITAL_COMMUNITY): Payer: Self-pay

## 2021-06-14 MED ORDER — VYVANSE 20 MG PO CAPS
20.0000 mg | ORAL_CAPSULE | Freq: Every day | ORAL | 0 refills | Status: DC
  Filled 2021-09-01: qty 30, 30d supply, fill #0

## 2021-06-14 MED ORDER — VYVANSE 20 MG PO CAPS
20.0000 mg | ORAL_CAPSULE | Freq: Every day | ORAL | 0 refills | Status: DC
  Filled 2021-10-10: qty 30, 30d supply, fill #0

## 2021-06-14 MED ORDER — VYVANSE 20 MG PO CAPS
20.0000 mg | ORAL_CAPSULE | Freq: Every day | ORAL | 0 refills | Status: DC
  Filled 2021-07-31: qty 30, 30d supply, fill #0

## 2021-06-30 IMAGING — MG DIGITAL SCREENING BILAT W/ TOMO W/ CAD
8 series · 8 of 24 positions shown · non-contrast
Comparison: None.

ACR Breast Density Category a: The breast tissue is almost entirely
fatty.

CLINICAL DATA: Screening.

EXAM:
DIGITAL SCREENING BILATERAL MAMMOGRAM WITH TOMO AND CAD

[R MLO synth-2D]
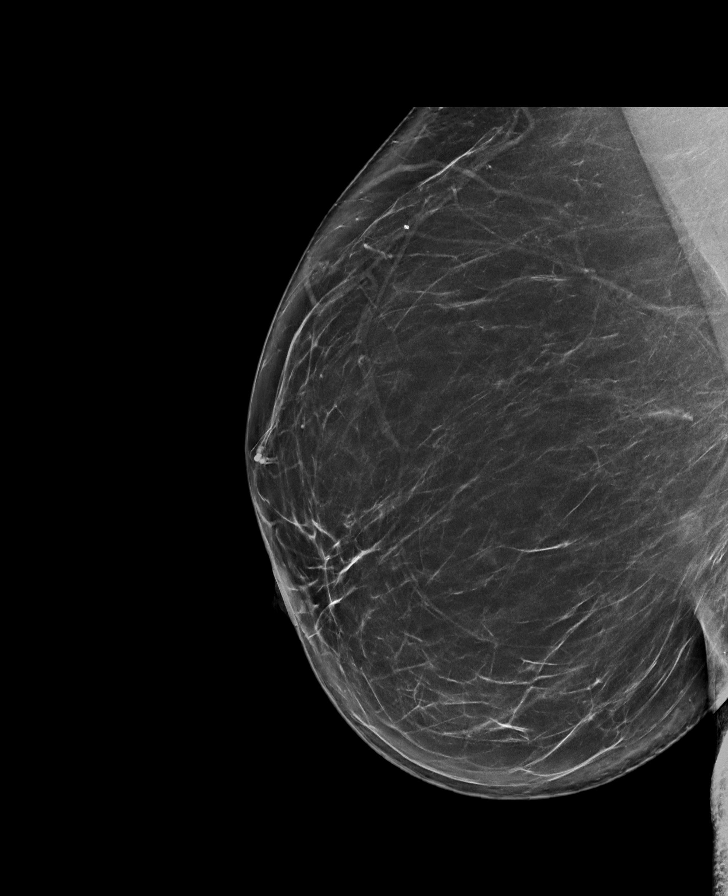

[L CC synth-2D]
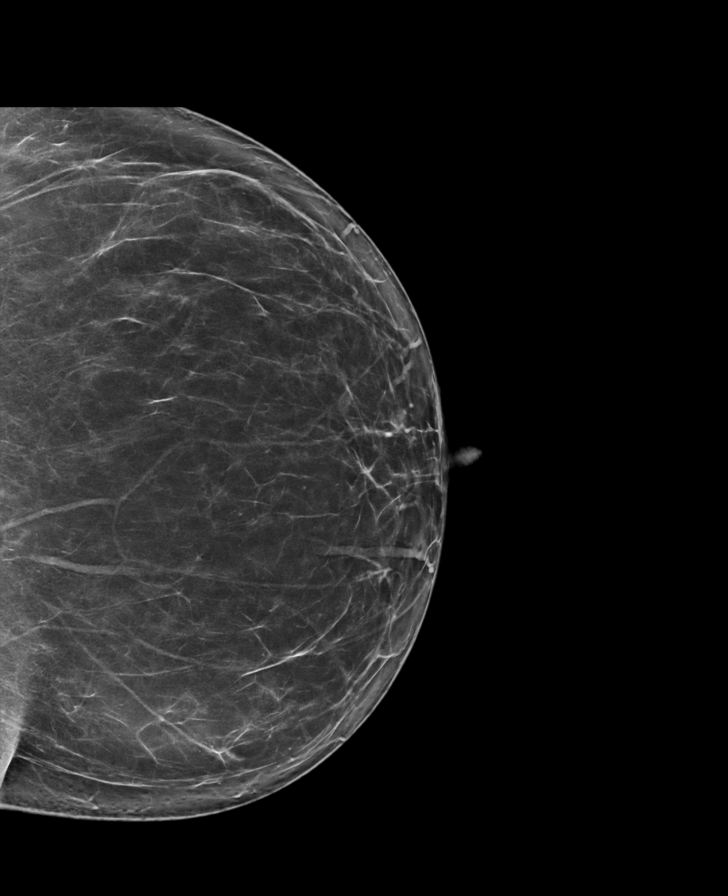

[L MLO synth-2D]
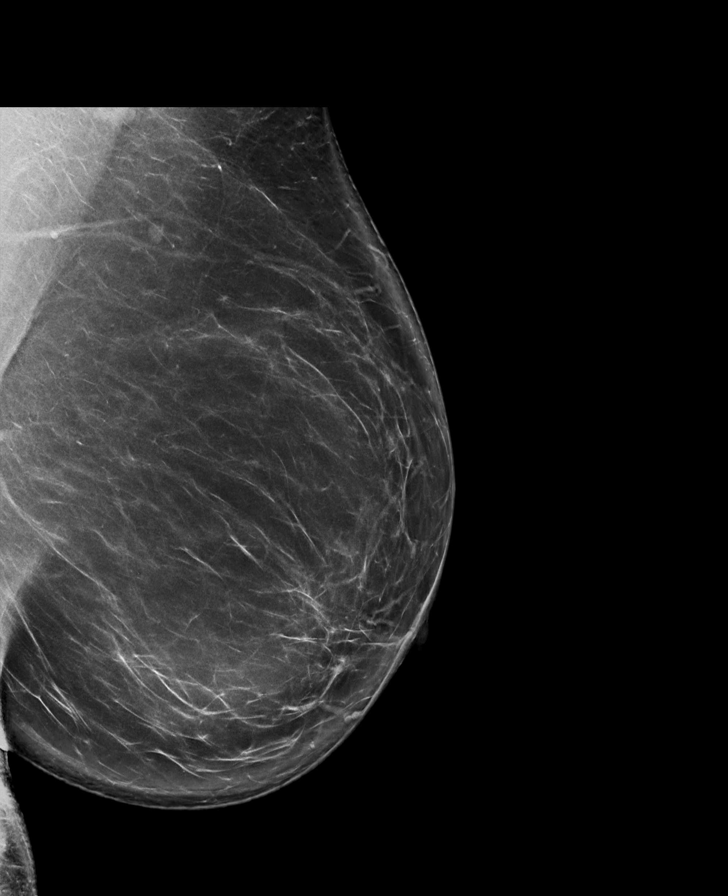

[R CC synth-2D]
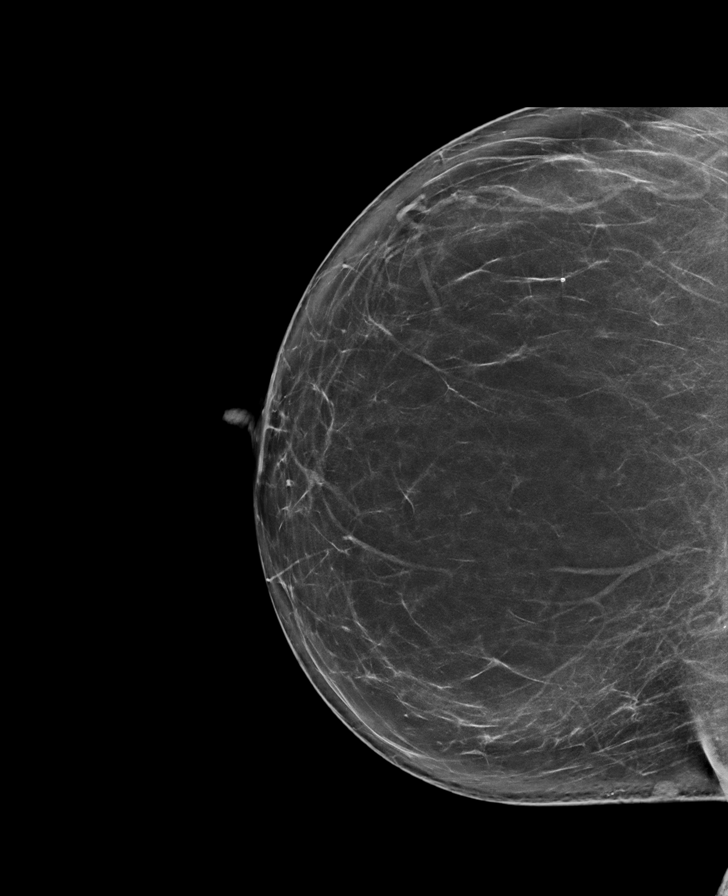

[L MLO tomo · tomo slice 53/104.0]
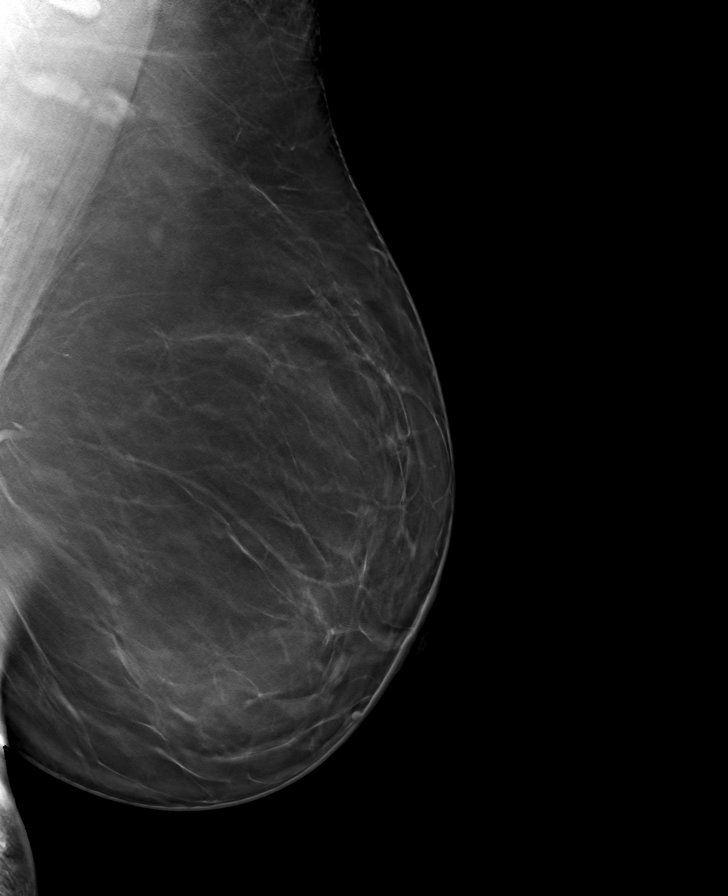

[R MLO tomo · tomo slice 47/94.0]
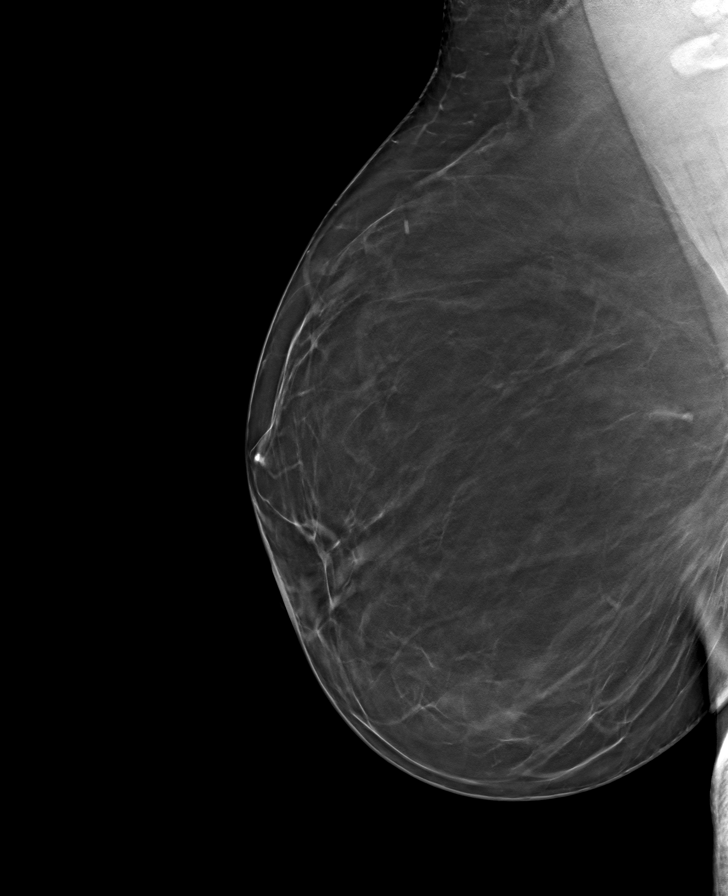

[L CC tomo · tomo slice 43/84.0]
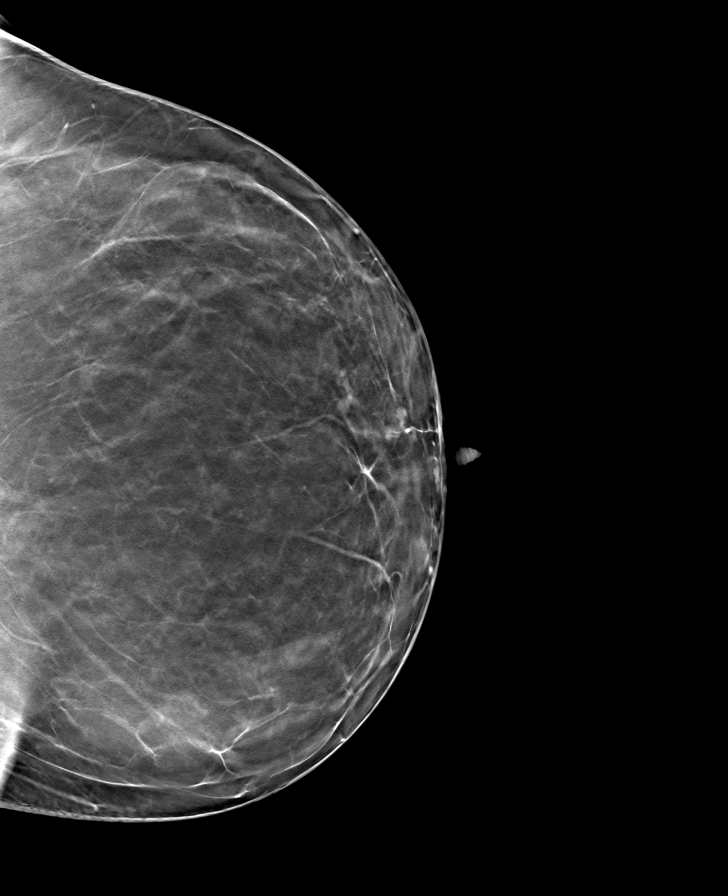

[R CC tomo · tomo slice 44/87.0]
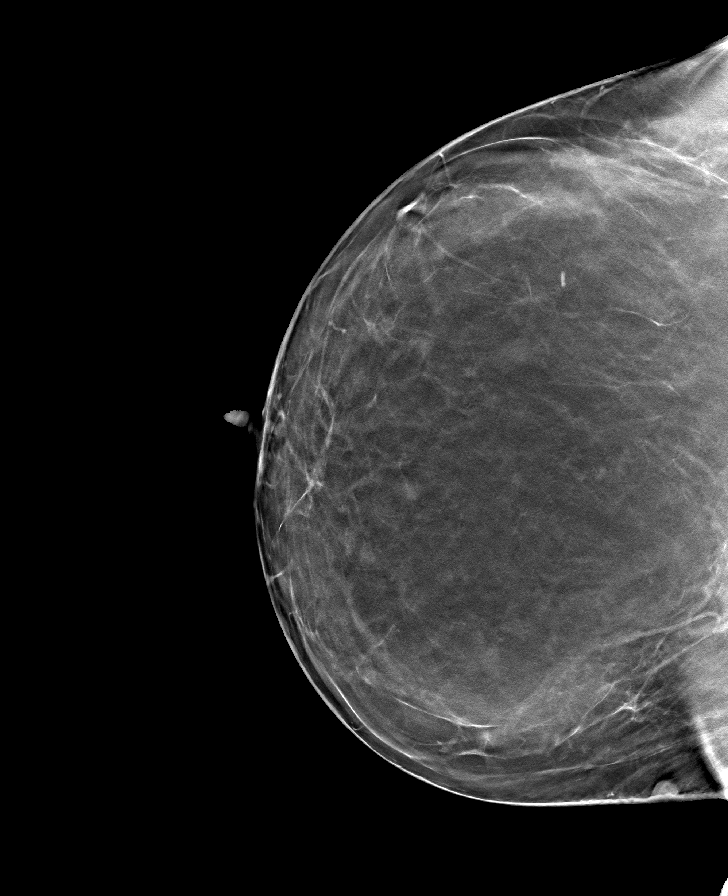

[8 of 24 positions shown; findings below may reference images not displayed]

FINDINGS: There are no findings suspicious for malignancy. Images were
processed with CAD.
IMPRESSION: No mammographic evidence of malignancy. A result letter of this
screening mammogram will be mailed directly to the patient.

RECOMMENDATION:
Screening mammogram in one year. (Code:[id number])

BI-RADS CATEGORY  1: Negative.

## 2021-07-31 ENCOUNTER — Other Ambulatory Visit (HOSPITAL_COMMUNITY): Payer: Self-pay

## 2021-08-08 DIAGNOSIS — F902 Attention-deficit hyperactivity disorder, combined type: Secondary | ICD-10-CM | POA: Diagnosis not present

## 2021-08-08 DIAGNOSIS — Z79899 Other long term (current) drug therapy: Secondary | ICD-10-CM | POA: Diagnosis not present

## 2021-08-09 DIAGNOSIS — F902 Attention-deficit hyperactivity disorder, combined type: Secondary | ICD-10-CM | POA: Diagnosis not present

## 2021-09-01 ENCOUNTER — Other Ambulatory Visit (HOSPITAL_COMMUNITY): Payer: Self-pay

## 2021-10-10 ENCOUNTER — Other Ambulatory Visit (HOSPITAL_COMMUNITY): Payer: Self-pay

## 2021-10-11 ENCOUNTER — Other Ambulatory Visit (HOSPITAL_COMMUNITY): Payer: Self-pay

## 2021-11-15 ENCOUNTER — Other Ambulatory Visit (HOSPITAL_COMMUNITY): Payer: Self-pay

## 2021-11-15 DIAGNOSIS — Z79899 Other long term (current) drug therapy: Secondary | ICD-10-CM | POA: Diagnosis not present

## 2021-11-15 DIAGNOSIS — F419 Anxiety disorder, unspecified: Secondary | ICD-10-CM | POA: Diagnosis not present

## 2021-11-15 DIAGNOSIS — F902 Attention-deficit hyperactivity disorder, combined type: Secondary | ICD-10-CM | POA: Diagnosis not present

## 2021-11-15 MED ORDER — VYVANSE 30 MG PO CAPS
30.0000 mg | ORAL_CAPSULE | Freq: Every day | ORAL | 0 refills | Status: DC
  Filled 2021-11-15: qty 30, 30d supply, fill #0

## 2021-12-18 ENCOUNTER — Other Ambulatory Visit (HOSPITAL_COMMUNITY): Payer: Self-pay

## 2021-12-18 MED ORDER — VYVANSE 30 MG PO CAPS
30.0000 mg | ORAL_CAPSULE | Freq: Every day | ORAL | 0 refills | Status: DC
  Filled 2021-12-18: qty 30, 30d supply, fill #0

## 2022-01-23 ENCOUNTER — Other Ambulatory Visit (HOSPITAL_COMMUNITY): Payer: Self-pay

## 2022-01-23 MED ORDER — VYVANSE 30 MG PO CAPS
30.0000 mg | ORAL_CAPSULE | Freq: Every day | ORAL | 0 refills | Status: DC
Start: 2022-01-23 — End: 2022-03-14
  Filled 2022-01-23: qty 30, 30d supply, fill #0

## 2022-02-14 DIAGNOSIS — Z79899 Other long term (current) drug therapy: Secondary | ICD-10-CM | POA: Diagnosis not present

## 2022-02-14 DIAGNOSIS — F902 Attention-deficit hyperactivity disorder, combined type: Secondary | ICD-10-CM | POA: Diagnosis not present

## 2022-02-14 DIAGNOSIS — F419 Anxiety disorder, unspecified: Secondary | ICD-10-CM | POA: Diagnosis not present

## 2022-02-15 DIAGNOSIS — Z79899 Other long term (current) drug therapy: Secondary | ICD-10-CM | POA: Diagnosis not present

## 2022-02-15 DIAGNOSIS — F902 Attention-deficit hyperactivity disorder, combined type: Secondary | ICD-10-CM | POA: Diagnosis not present

## 2022-03-06 ENCOUNTER — Other Ambulatory Visit: Payer: Self-pay | Admitting: Family Medicine

## 2022-03-06 ENCOUNTER — Encounter: Payer: Self-pay | Admitting: Family Medicine

## 2022-03-06 ENCOUNTER — Ambulatory Visit: Payer: 59 | Admitting: Family Medicine

## 2022-03-06 VITALS — BP 125/75 | HR 91 | Temp 98.9°F | Ht 68.0 in | Wt 210.0 lb

## 2022-03-06 DIAGNOSIS — N611 Abscess of the breast and nipple: Secondary | ICD-10-CM

## 2022-03-06 DIAGNOSIS — Z1231 Encounter for screening mammogram for malignant neoplasm of breast: Secondary | ICD-10-CM

## 2022-03-06 MED ORDER — CEPHALEXIN 500 MG PO CAPS: 500.0000 mg | ORAL_CAPSULE | Freq: Three times a day (TID) | ORAL | 0 refills | Status: DC

## 2022-03-06 MED ORDER — MUPIROCIN CALCIUM 2 % EX CREA: 1.0000 | TOPICAL_CREAM | Freq: Two times a day (BID) | CUTANEOUS | 0 refills | Status: DC

## 2022-03-06 NOTE — Progress Notes (Unsigned)
Teresa Wong , [birth year], 46 y.o., female MRN: [id number] Patient Care Team    Relationship Specialty Notifications Start End  Ma Hillock, DO PCP - General Family Medicine  03/04/15   Britt Bottom, MD Consulting Physician Neurology  09/28/17   Bobbye Charleston, MD Consulting Physician Obstetrics and Gynecology  12/25/17     Chief Complaint  Patient presents with   Cyst    Pt c/o cyst on R breast x 5 years worsen in the last 5 days; cyst present with swelling redness and pain;      Subjective: Pt presents for an OV with complaints of right breast abscess that has worsened over last 5 days. She reports she has had and I&D on this site in the past. She denies fever or chills. She denies drainage. The area is red, painful and swollen.  Denies MRSA in the past and prior culture was normal.      03/06/2022    3:57 PM 01/09/2021    9:28 AM 04/08/2019    2:41 PM 10/23/2018   10:33 AM 02/03/2018    4:02 PM  Depression screen PHQ 2/9  Decreased Interest 0 0 0 3 0  Down, Depressed, Hopeless 0 0 0 3 0  PHQ - 2 Score 0 0 0 6 0  Altered sleeping  1 0 1 1  Tired, decreased energy  0 1 1 1   Change in appetite  0 0 1 0  Feeling bad or failure about yourself   0 0 1 0  Trouble concentrating  0 1 2 0  Moving slowly or fidgety/restless  0 0 1 0  Suicidal thoughts  0 0 0 0  PHQ-9 Score  1 2 13 2   Difficult doing work/chores   Not difficult at all Somewhat difficult     No Known Allergies Social History   Social History Narrative   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  Heparin and folic acid   History of LEEP (loop electrosurgical excision procedure) of cervix complicating  pregnancy 8921   Meningitis 1984   Numbness and tingling of both legs 05/21/2017   Postpartum depression    Pregnancy induced hypertension 2010, 2012   History - resolved - no problems with current pregnancy 2015   Past Surgical History:  Procedure Laterality Date   CERVICAL BIOPSY  W/ LOOP ELECTRODE EXCISION  2006   CESAREAN SECTION N/A 11/26/2013   Procedure: CESAREAN SECTION;  Surgeon: Daria Pastures, MD;  Location: [facility];  Service: Obstetrics;  Laterality: N/A;   CHOLECYSTECTOMY     COLPOSCOPY  09/2004   DILATION AND CURETTAGE OF UTERUS  11/2011,08/2010,12/2007,08/2007   x4   DILATION AND EVACUATION  12/18/2011   Procedure: DILATATION AND EVACUATION;  Surgeon: Daria Pastures, MD;  Location: [facility];  Service: Gynecology;  Laterality: N/A;   LAPAROSCOPIC CHOLECYSTECTOMY SINGLE PORT N/A 09/28/2017   Procedure: LAPAROSCOPIC CHOLECYSTECTOMY SINGLE SITE WITH CHOLANGIOGRAM;  Surgeon: Michael Boston, MD;  Location: [facility];  Service: General;  Laterality: N/A;   TUBAL LIGATION Bilateral 11/26/2013   Procedure: BILATERAL TUBAL LIGATION;  Surgeon: Daria Pastures,  MD;  Location: [facility];  Service: Obstetrics;  Laterality: Bilateral;   WISDOM TOOTH EXTRACTION     Family History  Problem Relation Age of Onset   Hypertension Mother    Hyperlipidemia Mother    CAD Father 2       Bypass surgery 1.5 yrs ago    Hyperlipidemia Father    Cancer Father        bladder   Bladder Cancer Father    Diverticulitis Father    Heart disease Maternal Grandfather    Colon cancer Maternal Grandmother 8   Miscarriages / Stillbirths Paternal Grandmother    Allergies as of 03/06/2022   No Known Allergies      Medication List        Accurate as of March 06, 2022 11:59 PM. If you have any questions, ask your nurse or doctor.          albuterol 108 (90 Base) MCG/ACT inhaler Commonly known as: VENTOLIN HFA Inhale 2 puffs into the lungs every 6 (six) hours as needed for wheezing or shortness of  breath.   benzonatate 100 MG capsule Commonly known as: TESSALON Take 1 capsule (100 mg total) by mouth 3 (three) times daily as needed.   cephALEXin 500 MG capsule Commonly known as: KEFLEX Take 1 capsule (500 mg total) by mouth 3 (three) times daily. Started by: Howard Pouch, DO   cyanocobalamin 1000 MCG tablet Take 1,000 mcg by mouth daily.   fluticasone 50 MCG/ACT nasal spray Commonly known as: FLONASE Place 2 sprays into both nostrils daily.   multivitamin tablet Take 1 tablet by mouth daily.   mupirocin cream 2 % Commonly known as: BACTROBAN Apply 1 Application topically 2 (two) times daily. Started by: Howard Pouch, DO   promethazine-dextromethorphan 6.25-15 MG/5ML syrup Commonly known as: PROMETHAZINE-DM Take 5 mLs by mouth 4 (four) times daily as needed for cough.   Vitamin D 125 MCG (5000 UT) Caps Take 1 capsule by mouth daily.   Vyvanse 20 MG capsule Generic drug: lisdexamfetamine TAKE 1 CAPSULE BY MOUTH ONCE DAILY. DUE 08/30/20.   Vyvanse 20 MG capsule Generic drug: lisdexamfetamine TAKE 1 CAPSULE BY MOUTH ONCE DAILY.   Vyvanse 20 MG capsule Generic drug: lisdexamfetamine TAKE 1 CAPSULE BY MOUTH ONCE DAILY   Vyvanse 20 MG capsule Generic drug: lisdexamfetamine Take 1 capsule by mouth daily.   Vyvanse 20 MG capsule Generic drug: lisdexamfetamine Take 1 capsule by mouth daily.   Vyvanse 20 MG capsule Generic drug: lisdexamfetamine Take 1 capsule (20 mg total) by mouth daily.   Vyvanse 20 MG capsule Generic drug: lisdexamfetamine Take 1 capsule (20 mg total) by mouth daily.   Vyvanse 20 MG capsule Generic drug: lisdexamfetamine Take 1 capsule (20 mg total) by mouth daily.   Vyvanse 20 MG capsule Generic drug: lisdexamfetamine Take 1 capsule (20 mg total) by mouth daily.   Vyvanse 20 MG capsule Generic drug: lisdexamfetamine Take 1 capsule (20 mg total) by mouth daily.   Vyvanse 30 MG capsule Generic drug: lisdexamfetamine Take 1  capsule (30 mg total) by mouth daily.        All past medical history, surgical history, allergies, family history, immunizations andmedications were updated in the EMR today and reviewed under the history and medication portions of their EMR.     ROS Negative, with the exception of above mentioned in HPI   Objective:  BP 125/75   Pulse 91   Temp 98.9 F (37.2 C) (Oral)   Ht 5' 8"  (  1.727 m)   Wt 210 lb (95.3 kg)   LMP 02/16/2022   SpO2 98%   BMI 31.93 kg/m  Body mass index is 31.93 kg/m. Physical Exam Vitals and nursing note reviewed.  Constitutional:      General: She is not in acute distress.    Appearance: Normal appearance. She is normal weight. She is not ill-appearing or toxic-appearing.  Eyes:     Extraocular Movements: Extraocular movements intact.     Conjunctiva/sclera: Conjunctivae normal.     Pupils: Pupils are equal, round, and reactive to light.  Cardiovascular:     Rate and Rhythm: Normal rate.  Pulmonary:     Effort: Pulmonary effort is normal.  Musculoskeletal:     Cervical back: Neck supple.  Lymphadenopathy:     Cervical: No cervical adenopathy.  Skin:    Comments: 3 mm abscess medial right breast with surround swelling and erythema. No drainage or bleeding.   Neurological:     Mental Status: She is alert and oriented to person, place, and time. Mental status is at baseline.  Psychiatric:        Mood and Affect: Mood normal.        Behavior: Behavior normal.        Thought Content: Thought content normal.        Judgment: Judgment normal.      No results found. No results found. No results found for this or any previous visit (from the past 24 hour(s)).  Assessment/Plan: Kay Shippy is a 46 y.o. female present for OV for  Breast abscess Small abscess of right medial breast. Hopefully will not require I&D this time.  Warm compresses.  Avoid "squeezing" or forcefully attempting to expel abscess.  Start keflex TID x 7  days Bactroban bid F/u 7-14 days if not resolved, sooner if worsening.   Reviewed expectations re: course of current medical issues. Discussed self-management of symptoms. Outlined signs and symptoms indicating need for more acute intervention. Patient verbalized understanding and all questions were answered. Patient received an After-Visit Summary.    No orders of the defined types were placed in this encounter.  Meds ordered this encounter  Medications   cephALEXin (KEFLEX) 500 MG capsule    Sig: Take 1 capsule (500 mg total) by mouth 3 (three) times daily.    Dispense:  21 capsule    Refill:  0   mupirocin cream (BACTROBAN) 2 %    Sig: Apply 1 Application topically 2 (two) times daily.    Dispense:  15 g    Refill:  0    May replace with ointment if cheaper   Referral Orders  No referral(s) requested today     Note is dictated utilizing voice recognition software. Although note has been proof read prior to signing, occasional typographical errors still can be missed. If any questions arise, please do not hesitate to call for verification.   electronically signed by:  Howard Pouch, DO  [place]

## 2022-03-06 NOTE — Patient Instructions (Signed)
Skin Abscess  A skin abscess is an infected area of your skin that contains pus and other material. An abscess can happen in any part of your body. Some abscesses break open (rupture) on their own. Most continue to get worse unless they are treated. The infection can spread deeper into the body and into your blood, which can make you feel sick. A skin abscess is caused by germs that enter the skin through a cut or scrape. It can also be caused by blocked oil and sweat glands or infected hair follicles. This condition is usually treated by: Draining the pus. Taking antibiotic medicines. Placing a warm, wet washcloth over the abscess. Follow these instructions at home: Medicines  Take over-the-counter and prescription medicines only as told by your doctor. If you were prescribed an antibiotic medicine, take it as told by your doctor. Do not stop taking the antibiotic even if you start to feel better. Abscess care  If you have an abscess that has not drained, place a warm, clean, wet washcloth over the abscess several times a day. Do this as told by your doctor. Follow instructions from your doctor about how to take care of your abscess. Make sure you: Cover the abscess with a bandage (dressing). Change your bandage or gauze as told by your doctor. Wash your hands with soap and water before you change the bandage or gauze. If you cannot use soap and water, use hand sanitizer. Check your abscess every day for signs that the infection is getting worse. Check for: More redness, swelling, or pain. More fluid or blood. Warmth. More pus or a bad smell. General instructions To avoid spreading the infection: Do not share personal care items, towels, or hot tubs with others. Avoid making skin-to-skin contact with other people. Keep all follow-up visits as told by your doctor. This is important. Contact a doctor if: You have more redness, swelling, or pain around your abscess. You have more fluid  or blood coming from your abscess. Your abscess feels warm when you touch it. You have more pus or a bad smell coming from your abscess. Your muscles ache. You feel sick. Get help right away if: You have very bad (severe) pain. You see red streaks on your skin spreading away from the abscess. You see redness that spreads quickly. You have a fever or chills. Summary A skin abscess is an infected area of your skin that contains pus and other material. The abscess is caused by germs that enter the skin through a cut or scrape. It can also be caused by blocked oil and sweat glands or infected hair follicles. Follow your doctor's instructions on caring for your abscess, taking medicines, preventing infections, and keeping follow-up visits. This information is not intended to replace advice given to you by your health care provider. Make sure you discuss any questions you have with your health care provider. Document Revised: 10/19/2021 Document Reviewed: 04/24/2021 Elsevier Patient Education  [employer].

## 2022-03-14 ENCOUNTER — Encounter: Payer: Self-pay | Admitting: Family Medicine

## 2022-03-14 ENCOUNTER — Other Ambulatory Visit (HOSPITAL_COMMUNITY): Payer: Self-pay

## 2022-03-14 MED ORDER — VYVANSE 30 MG PO CAPS
30.0000 mg | ORAL_CAPSULE | Freq: Every day | ORAL | 0 refills | Status: DC
  Filled 2022-03-14: qty 30, 30d supply, fill #0

## 2022-03-15 ENCOUNTER — Other Ambulatory Visit (HOSPITAL_COMMUNITY): Payer: Self-pay

## 2022-03-15 ENCOUNTER — Ambulatory Visit: Payer: 59 | Admitting: Family Medicine

## 2022-03-15 ENCOUNTER — Encounter: Payer: Self-pay | Admitting: Family Medicine

## 2022-03-15 VITALS — BP 117/79 | HR 80 | Temp 98.1°F | Ht 68.0 in | Wt 211.0 lb

## 2022-03-15 DIAGNOSIS — N611 Abscess of the breast and nipple: Secondary | ICD-10-CM

## 2022-03-15 MED ORDER — DOXYCYCLINE HYCLATE 100 MG PO TABS
100.0000 mg | ORAL_TABLET | Freq: Two times a day (BID) | ORAL | 0 refills | Status: DC
  Filled 2022-03-15: qty 10, 5d supply, fill #0

## 2022-03-15 NOTE — Patient Instructions (Signed)

## 2022-03-15 NOTE — Progress Notes (Signed)
[name] , 14-Dec-1975, 46 y.o., female MRN: [id number] Patient Care Team    Relationship Specialty Notifications Start End  Ma Hillock, DO PCP - General Family Medicine  03/04/15   Britt Bottom, MD Consulting Physician Neurology  09/28/17   Bobbye Charleston, MD Consulting Physician Obstetrics and Gynecology  12/25/17     Chief Complaint  Patient presents with   Cyst    Pt reports very slight improve      Subjective: Teresa Wong is present to follow up on  breast cyst. She has completed her abx tx. she reports the redness and firmness surrounding the cyst has improved but the cyst seems to be a little larger.  She denies fevers or chills.  She denies drainage or bleeding.  Prior note: Pt presents for an OV with complaints of right breast abscess that has worsened over last 5 days. She reports she has had and I&D on this site in the past. She denies fever or chills. She denies drainage. The area is red, painful and swollen.  Denies MRSA in the past and prior culture was normal.      03/06/2022    3:57 PM 01/09/2021    9:28 AM 04/08/2019    2:41 PM 10/23/2018   10:33 AM 02/03/2018    4:02 PM  Depression screen PHQ 2/9  Decreased Interest 0 0 0 3 0  Down, Depressed, Hopeless 0 0 0 3 0  PHQ - 2 Score 0 0 0 6 0  Altered sleeping  1 0 1 1  Tired, decreased energy  0 1 1 1   Change in appetite  0 0 1 0  Feeling bad or failure about yourself   0 0 1 0  Trouble concentrating  0 1 2 0  Moving slowly or fidgety/restless  0 0 1 0  Suicidal thoughts  0 0 0 0  PHQ-9 Score  1 2 13 2   Difficult doing work/chores   Not difficult at all Somewhat difficult     No Known Allergies Social History   Social History Narrative   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  Heparin and folic acid   History of LEEP (loop electrosurgical excision procedure) of cervix complicating pregnancy 2197   Meningitis 1984   Numbness and tingling of both legs 05/21/2017   Postpartum depression    Pregnancy induced hypertension 2010, 2012   History - resolved - no problems with current pregnancy 2015   Past Surgical History:  Procedure Laterality Date   CERVICAL BIOPSY  W/ LOOP ELECTRODE EXCISION  2006   CESAREAN SECTION N/A 11/26/2013   Procedure: CESAREAN SECTION;  Surgeon: Daria Pastures, MD;  Location: [facility];  Service: Obstetrics;  Laterality: N/A;   CHOLECYSTECTOMY     COLPOSCOPY  09/2004   DILATION AND CURETTAGE OF UTERUS  11/2011,08/2010,12/2007,08/2007   x4   DILATION AND EVACUATION  12/18/2011   Procedure: DILATATION AND EVACUATION;  Surgeon: Daria Pastures, MD;  Location: [facility];  Service: Gynecology;  Laterality: N/A;   LAPAROSCOPIC CHOLECYSTECTOMY SINGLE PORT N/A 09/28/2017   Procedure: LAPAROSCOPIC CHOLECYSTECTOMY  SINGLE SITE WITH CHOLANGIOGRAM;  Surgeon: Michael Boston, MD;  Location: [facility];  Service: General;  Laterality: N/A;   TUBAL LIGATION Bilateral 11/26/2013   Procedure: BILATERAL TUBAL LIGATION;  Surgeon: Daria Pastures, MD;  Location: [facility];  Service: Obstetrics;  Laterality: Bilateral;   WISDOM TOOTH EXTRACTION     Family History  Problem Relation Age of Onset   Hypertension Mother    Hyperlipidemia Mother    CAD Father 36       Bypass surgery 1.5 yrs ago    Hyperlipidemia Father    Cancer Father        bladder   Bladder Cancer Father    Diverticulitis Father    Heart disease Maternal Grandfather    Colon cancer Maternal Grandmother 53   Miscarriages / Stillbirths Paternal Grandmother    Allergies as of 03/15/2022   No Known Allergies      Medication List        Accurate as of March 15, 2022 11:52 AM. If you have any  questions, ask your nurse or doctor.          cephALEXin 500 MG capsule Commonly known as: KEFLEX Take 1 capsule (500 mg total) by mouth 3 (three) times daily.   cyanocobalamin 1000 MCG tablet Take 1,000 mcg by mouth daily.   multivitamin tablet Take 1 tablet by mouth daily.   mupirocin cream 2 % Commonly known as: BACTROBAN Apply 1 Application topically 2 (two) times daily.   Vitamin D 125 MCG (5000 UT) Caps Take 1 capsule by mouth daily.   Vyvanse 20 MG capsule Generic drug: lisdexamfetamine TAKE 1 CAPSULE BY MOUTH ONCE DAILY. DUE 08/30/20.   Vyvanse 20 MG capsule Generic drug: lisdexamfetamine TAKE 1 CAPSULE BY MOUTH ONCE DAILY.   Vyvanse 20 MG capsule Generic drug: lisdexamfetamine TAKE 1 CAPSULE BY MOUTH ONCE DAILY   Vyvanse 20 MG capsule Generic drug: lisdexamfetamine Take 1 capsule by mouth daily.   Vyvanse 20 MG capsule Generic drug: lisdexamfetamine Take 1 capsule by mouth daily.   Vyvanse 30 MG capsule Generic drug: lisdexamfetamine Take 1 capsule (30 mg total) by mouth daily.        All past medical history, surgical history, allergies, family history, immunizations andmedications were updated in the EMR today and reviewed under the history and medication portions of their EMR.     ROS Negative, with the exception of above mentioned in HPI   Objective:  BP 117/79   Pulse 80   Temp 98.1 F (36.7 C) (Oral)   Ht 5' 8"  (1.727 m)   Wt 211 lb (95.7 kg)   LMP 02/16/2022   SpO2 99%   BMI 32.08 kg/m  Body mass index is 32.08 kg/m. Physical Exam Vitals and nursing note reviewed.  Constitutional:      General: She is not in acute distress.    Appearance: Normal appearance. She is normal weight. She is not ill-appearing or toxic-appearing.  Eyes:     Extraocular Movements: Extraocular movements intact.     Conjunctiva/sclera: Conjunctivae normal.     Pupils: Pupils are equal, round, and reactive to light.  Cardiovascular:     Rate and  Rhythm: Normal rate.  Pulmonary:     Effort: Pulmonary effort is normal.  Musculoskeletal:     Cervical back: Neck supple.  Lymphadenopathy:     Cervical: No cervical adenopathy.  Skin:    Comments: ~6 mm abscess medial right breast with lection once present.  No drainage or bleeding.  Mild redness surrounding.    Neurological:     Mental Status: She is alert and oriented to person, place, and time. Mental status is at baseline.  Psychiatric:        Mood and Affect: Mood normal.        Behavior: Behavior normal.        Thought Content: Thought content normal.        Judgment: Judgment normal.      No results found. No results found. No results found for this or any previous visit (from the past 24 hour(s)).  Assessment/Plan: Teresa Wong is a 46 y.o. female present for OV for  Breast abscess Although surrounding infection/irritation seems to have improved.  The abscess itself appears larger today.  I do recommend moving forward with incision and drainage.  Patient is agreeable to this today. Verbal consent obtained. Procedure Note:  PROCEDURE NOTE: Incision and Drainage Performed by: Dr. Raoul Pitch Indication: Abscess infection Anesthesia was obtained with 2.5 ml of 1% lidocaine with epinephrine. The area was prepped in the usual sterile fashion. A number 11 scalpel was used to create an incision inferior portion of abscess to allow drainage. Return was 2 cc of purulent fluid. Culture was  obtained. The site was  packed with iodoform. A dressing was placed over the site. The patient tolerated the procedure well. Wound care instructions were given. Patient to follow up in 1 week Doxy twice daily x5 days prescribed Post-Procedure Diagnosis: Abscess of breast  Complications: None Estimated Blood Loss: 0 mL   Reviewed expectations re: course of current medical issues. Discussed self-management of symptoms. Outlined signs and symptoms indicating need for more acute  intervention. Patient verbalized understanding and all questions were answered. Patient received an After-Visit Summary.    No orders of the defined types were placed in this encounter.  No orders of the defined types were placed in this encounter.  Referral Orders  No referral(s) requested today     Note is dictated utilizing voice recognition software. Although note has been proof read prior to signing, occasional typographical errors still can be missed. If any questions arise, please do not hesitate to call for verification.   electronically signed by:  Howard Pouch, DO  [place]

## 2022-03-19 LAB — WOUND CULTURE
MICRO NUMBER:: 13795072
RESULT:: NO GROWTH
SPECIMEN QUALITY:: ADEQUATE

## 2022-03-22 ENCOUNTER — Ambulatory Visit: Payer: 59 | Admitting: Family Medicine

## 2022-03-22 ENCOUNTER — Encounter: Payer: Self-pay | Admitting: Family Medicine

## 2022-03-22 VITALS — BP 105/71 | HR 85 | Temp 98.1°F | Ht 68.0 in | Wt 213.0 lb

## 2022-03-22 DIAGNOSIS — Z5189 Encounter for other specified aftercare: Secondary | ICD-10-CM

## 2022-03-22 NOTE — Progress Notes (Signed)
Teresa Wong , [birth year], 46 y.o., female MRN: [id number] Patient Care Team    Relationship Specialty Notifications Start End  Ma Hillock, DO PCP - General Family Medicine  03/04/15   Britt Bottom, MD Consulting Physician Neurology  09/28/17   Bobbye Charleston, MD Consulting Physician Obstetrics and Gynecology  12/25/17     Chief Complaint  Patient presents with   Wound Check     Subjective: Teresa Wong is present to follow up on  breast abscess s/p I&D with packing 1 week ago. Pt reports abscess is much improved. Not tender, swollen or draining. She has been removing a few cm daily of her packing.   Prior note:   She has completed her abx tx. she reports the redness and firmness surrounding the cyst has improved but the cyst seems to be a little larger.  She denies fevers or chills.  She denies drainage or bleeding.  Prior note: Pt presents for an OV with complaints of right breast abscess that has worsened over last 5 days. She reports she has had and I&D on this site in the past. She denies fever or chills. She denies drainage. The area is red, painful and swollen.  Denies MRSA in the past and prior culture was normal.      03/06/2022    3:57 PM 01/09/2021    9:28 AM 04/08/2019    2:41 PM 10/23/2018   10:33 AM 02/03/2018    4:02 PM  Depression screen PHQ 2/9  Decreased Interest 0 0 0 3 0  Down, Depressed, Hopeless 0 0 0 3 0  PHQ - 2 Score 0 0 0 6 0  Altered sleeping  1 0 1 1  Tired, decreased energy  0 1 1 1   Change in appetite  0 0 1 0  Feeling bad or failure about yourself   0 0 1 0  Trouble concentrating  0 1 2 0  Moving slowly or fidgety/restless  0 0 1 0  Suicidal thoughts  0 0 0 0  PHQ-9 Score  1 2 13 2   Difficult doing work/chores   Not difficult at all Somewhat difficult     No Known Allergies Social History   Social History Narrative   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  Heparin and folic acid   History of LEEP (loop electrosurgical excision procedure) of cervix complicating pregnancy 9030   Meningitis 1984   Numbness and tingling of both legs 05/21/2017   Postpartum depression    Pregnancy induced hypertension 2010, 2012   History - resolved - no problems with current pregnancy 2015   Past Surgical History:  Procedure Laterality Date   CERVICAL BIOPSY  W/ LOOP ELECTRODE EXCISION  2006   CESAREAN SECTION N/A 11/26/2013   Procedure: CESAREAN SECTION;  Surgeon: Daria Pastures, MD;  Location: [facility];  Service: Obstetrics;  Laterality: N/A;   CHOLECYSTECTOMY     COLPOSCOPY  09/2004   DILATION AND CURETTAGE OF UTERUS  11/2011,08/2010,12/2007,08/2007   x4   DILATION AND EVACUATION  12/18/2011   Procedure: DILATATION AND EVACUATION;  Surgeon:  Daria Pastures, MD;  Location: [facility];  Service: Gynecology;  Laterality: N/A;   LAPAROSCOPIC CHOLECYSTECTOMY SINGLE PORT N/A 09/28/2017   Procedure: LAPAROSCOPIC CHOLECYSTECTOMY SINGLE SITE WITH CHOLANGIOGRAM;  Surgeon: Michael Boston, MD;  Location: [facility];  Service: General;  Laterality: N/A;   TUBAL LIGATION Bilateral 11/26/2013   Procedure: BILATERAL TUBAL LIGATION;  Surgeon: Daria Pastures, MD;  Location: [facility];  Service: Obstetrics;  Laterality: Bilateral;   WISDOM TOOTH EXTRACTION     Family History  Problem Relation Age of Onset   Hypertension Mother    Hyperlipidemia Mother    CAD Father 54       Bypass surgery 1.5 yrs ago    Hyperlipidemia Father    Cancer Father        bladder   Bladder Cancer Father    Diverticulitis Father    Heart disease Maternal Grandfather    Colon cancer Maternal Grandmother 64   Miscarriages / Stillbirths Paternal Grandmother     Allergies as of 03/22/2022   No Known Allergies      Medication List        Accurate as of March 22, 2022 11:18 AM. If you have any questions, ask your nurse or doctor.          STOP taking these medications    cephALEXin 500 MG capsule Commonly known as: KEFLEX Stopped by: Howard Pouch, DO   doxycycline 100 MG tablet Commonly known as: VIBRA-TABS Stopped by: Howard Pouch, DO   mupirocin cream 2 % Commonly known as: BACTROBAN Stopped by: Howard Pouch, DO       TAKE these medications    cyanocobalamin 1000 MCG tablet Take 1,000 mcg by mouth daily.   multivitamin tablet Take 1 tablet by mouth daily.   Vitamin D 125 MCG (5000 UT) Caps Take 1 capsule by mouth daily.   Vyvanse 20 MG capsule Generic drug: lisdexamfetamine TAKE 1 CAPSULE BY MOUTH ONCE DAILY. DUE 08/30/20.   Vyvanse 20 MG capsule Generic drug: lisdexamfetamine TAKE 1 CAPSULE BY MOUTH ONCE DAILY.   Vyvanse 20 MG capsule Generic drug: lisdexamfetamine TAKE 1 CAPSULE BY MOUTH ONCE DAILY   Vyvanse 20 MG capsule Generic drug: lisdexamfetamine Take 1 capsule by mouth daily.   Vyvanse 20 MG capsule Generic drug: lisdexamfetamine Take 1 capsule by mouth daily.   Vyvanse 30 MG capsule Generic drug: lisdexamfetamine Take 1 capsule (30 mg total) by mouth daily.        All past medical history, surgical history, allergies, family history, immunizations andmedications were updated in the EMR today and reviewed under the history and medication portions of their EMR.     Review of Systems  Constitutional:  Negative for chills and fever.   Negative, with the exception of above mentioned in HPI   Objective:  BP 105/71   Pulse 85   Temp 98.1 F (36.7 C) (Oral)   Ht 5' 8"  (1.727 m)   Wt 213 lb (96.6 kg)   LMP 03/18/2022   SpO2 98%   BMI 32.39 kg/m  Body mass index is 32.39 kg/m. Physical Exam Vitals and nursing note reviewed.  Constitutional:      General: She is not in acute  distress.    Appearance: Normal appearance. She is normal weight. She is not ill-appearing or toxic-appearing.  HENT:     Head: Normocephalic and atraumatic.  Eyes:     Extraocular Movements: Extraocular movements intact.     Conjunctiva/sclera: Conjunctivae normal.     Pupils:  Pupils are equal, round, and reactive to light.  Cardiovascular:     Rate and Rhythm: Normal rate.  Pulmonary:     Effort: Pulmonary effort is normal.  Musculoskeletal:     Cervical back: Neck supple.  Lymphadenopathy:     Cervical: No cervical adenopathy.  Skin:    Comments: 1 inch packing removed today of right medial breast incision. No bleeding. No drainage, healing well.   Neurological:     Mental Status: She is alert and oriented to person, place, and time. Mental status is at baseline.  Psychiatric:        Mood and Affect: Mood normal.        Behavior: Behavior normal.        Thought Content: Thought content normal.        Judgment: Judgment normal.      No results found. No results found. No results found for this or any previous visit (from the past 24 hour(s)).  Assessment/Plan: Teresa Wong is a 46 y.o. female present for OV for  Breast abscess Incision is healing well. Remaining of packing removed today.  Steri-strip x2 applied Pt tolerated well  Wound care instructions provided.  F/u only if signs of infection occur.   Reviewed expectations re: course of current medical issues. Discussed self-management of symptoms. Outlined signs and symptoms indicating need for more acute intervention. Patient verbalized understanding and all questions were answered. Patient received an After-Visit Summary.    No orders of the defined types were placed in this encounter.  No orders of the defined types were placed in this encounter.  Referral Orders  No referral(s) requested today     Note is dictated utilizing voice recognition software. Although note has been proof read prior to  signing, occasional typographical errors still can be missed. If any questions arise, please do not hesitate to call for verification.   electronically signed by:  Howard Pouch, DO  [place]

## 2022-03-28 DIAGNOSIS — Z1231 Encounter for screening mammogram for malignant neoplasm of breast: Secondary | ICD-10-CM

## 2022-05-09 ENCOUNTER — Ambulatory Visit
Admission: RE | Admit: 2022-05-09 | Discharge: 2022-05-09 | Disposition: A | Discharge status: Home / Self Care | Payer: 59 | Source: Ambulatory Visit | Attending: Family Medicine | Admitting: Family Medicine

## 2022-05-09 DIAGNOSIS — Z1231 Encounter for screening mammogram for malignant neoplasm of breast: Secondary | ICD-10-CM | POA: Diagnosis not present

## 2022-06-06 ENCOUNTER — Other Ambulatory Visit (HOSPITAL_COMMUNITY): Payer: Self-pay

## 2022-06-06 DIAGNOSIS — F902 Attention-deficit hyperactivity disorder, combined type: Secondary | ICD-10-CM | POA: Diagnosis not present

## 2022-06-06 DIAGNOSIS — Z79899 Other long term (current) drug therapy: Secondary | ICD-10-CM | POA: Diagnosis not present

## 2022-06-06 MED ORDER — LISDEXAMFETAMINE DIMESYLATE 30 MG PO CAPS
30.0000 mg | ORAL_CAPSULE | Freq: Every day | ORAL | 0 refills | Status: DC
  Filled 2022-06-06: qty 30, 30d supply, fill #0

## 2022-06-07 ENCOUNTER — Other Ambulatory Visit (HOSPITAL_COMMUNITY): Payer: Self-pay

## 2022-07-27 ENCOUNTER — Other Ambulatory Visit (HOSPITAL_COMMUNITY): Payer: Self-pay

## 2022-09-04 ENCOUNTER — Other Ambulatory Visit (HOSPITAL_COMMUNITY): Payer: Self-pay

## 2022-11-05 ENCOUNTER — Encounter: Payer: Self-pay | Admitting: Family Medicine

## 2022-11-06 ENCOUNTER — Encounter: Payer: Self-pay | Admitting: Family Medicine

## 2022-11-06 ENCOUNTER — Other Ambulatory Visit: Payer: Managed Care, Other (non HMO)

## 2022-11-06 ENCOUNTER — Ambulatory Visit: Payer: Managed Care, Other (non HMO) | Admitting: Family Medicine

## 2022-11-06 VITALS — BP 134/85 | HR 79 | Temp 98.2°F | Wt 217.2 lb

## 2022-11-06 DIAGNOSIS — Z111 Encounter for screening for respiratory tuberculosis: Secondary | ICD-10-CM | POA: Diagnosis not present

## 2022-11-06 DIAGNOSIS — Z0184 Encounter for antibody response examination: Secondary | ICD-10-CM | POA: Diagnosis not present

## 2022-11-06 DIAGNOSIS — Z23 Encounter for immunization: Secondary | ICD-10-CM | POA: Diagnosis not present

## 2022-11-06 NOTE — Progress Notes (Signed)
Teresa Wong , March 29, 1976, 47 y.o., female MRN: [id number] Patient Care Team    Relationship Specialty Notifications Start End  Teresa Wong, Teresa Carignan A, DO PCP - General Family Medicine  03/04/15   Teresa Wong, Teresa A, MD Consulting Physician Neurology  09/28/17   Teresa Wong, Michelle, MD Consulting Physician Obstetrics and Gynecology  12/25/17     Chief Complaint  Patient presents with   Immunizations    Tb, hep b mmr td/tdap. varicella   immunity status     Subjective: Teresa Wong is Wong 47 y.o. Pt presents for an OV discussed immunity status testing need and tuberculosis screening required by her employer.  She aslo needs her tdap updated     03/06/2022    3:57 PM 01/09/2021    9:28 AM 04/08/2019    2:41 PM 10/23/2018   10:33 AM 02/03/2018    4:02 PM  Depression screen PHQ 2/9  Decreased Interest 0 0 0 3 0  Down, Depressed, Hopeless 0 0 0 3 0  PHQ - 2 Score 0 0 0 6 0  Altered sleeping  1 0 1 1  Tired, decreased energy  0 1 1 1   Change in appetite  0 0 1 0  Feeling bad or failure about yourself   0 0 1 0  Trouble concentrating  0 1 2 0  Moving slowly or fidgety/restless  0 0 1 0  Suicidal thoughts  0 0 0 0  PHQ-9 Score  1 2 13 2   Difficult doing work/chores   Not difficult at all Somewhat difficult     No Known Allergies Social History   Social History Narrative   Ms 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   Depression, recurrent 12/25/2017   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  Heparin and folic acid   History of LEEP (loop electrosurgical excision procedure) of cervix complicating pregnancy 2006   Meningitis 1984   Numbness and tingling of both legs 05/21/2017   Postpartum depression    Pregnancy induced hypertension 2010, 2012   History - resolved  - no problems with current pregnancy 2015   Past Surgical History:  Procedure Laterality Date   CERVICAL BIOPSY  W/ LOOP ELECTRODE EXCISION  2006   CESAREAN SECTION N/Wong 11/26/2013   Procedure: CESAREAN SECTION;  Surgeon: Loney LaurenceMichelle Wong Horvath, MD;  Location: [facility];  Service: Obstetrics;  Laterality: N/Wong;   CHOLECYSTECTOMY     COLPOSCOPY  09/2004   DILATION AND CURETTAGE OF UTERUS  11/2011,08/2010,12/2007,08/2007   x4   DILATION AND EVACUATION  12/18/2011   Procedure: DILATATION AND EVACUATION;  Surgeon: Loney LaurenceMichelle Wong Horvath, MD;  Location: [facility];  Service: Gynecology;  Laterality: N/Wong;   LAPAROSCOPIC CHOLECYSTECTOMY SINGLE PORT N/Wong 09/28/2017   Procedure: LAPAROSCOPIC CHOLECYSTECTOMY SINGLE SITE WITH CHOLANGIOGRAM;  Surgeon: Karie SodaGross, Steven, MD;  Location: [facility];  Service: General;  Laterality: N/Wong;   TUBAL LIGATION Bilateral 11/26/2013   Procedure: BILATERAL TUBAL LIGATION;  Surgeon: Loney LaurenceMichelle Wong Horvath, MD;  Location: [facility];  Service: Obstetrics;  Laterality: Bilateral;   WISDOM TOOTH EXTRACTION     Family History  Problem Relation Age of Onset   Hypertension Mother    Hyperlipidemia Mother    CAD  Father 39       Bypass surgery 1.5 yrs ago    Hyperlipidemia Father    Cancer Father        bladder   Bladder Cancer Father    Diverticulitis Father    Heart disease Maternal Grandfather    Colon cancer Maternal Grandmother 33   Miscarriages / Stillbirths Paternal Grandmother    Allergies as of 11/06/2022   No Known Allergies      Medication List        Accurate as of November 06, 2022 11:42 AM. If you have any questions, ask your nurse or doctor.          STOP taking these medications    lisdexamfetamine 30 MG capsule Commonly known as: Vyvanse Stopped by: Felix Pacini, DO   Vyvanse 20 MG capsule Generic drug: lisdexamfetamine Stopped by: Felix Pacini, DO       TAKE these medications    cyanocobalamin 1000 MCG tablet Take 1,000 mcg by mouth daily.   multivitamin  tablet Take 1 tablet by mouth daily.   Vitamin D 125 MCG (5000 UT) Caps Take 1 capsule by mouth daily.        All past medical history, surgical history, allergies, family history, immunizations andmedications were updated in the EMR today and reviewed under the history and medication portions of their EMR.     ROS Negative, with the exception of above mentioned in HPI   Objective:  BP 134/85   Pulse 79   Temp 98.2 F (36.8 C)   Wt 217 lb 3.2 oz (98.5 kg)   LMP 10/30/2022 (Approximate)   SpO2 99%   BMI 33.03 kg/m  Body mass index is 33.03 kg/m. Physical Exam Vitals and nursing note reviewed.  Constitutional:      General: She is not in acute distress.    Appearance: Normal appearance. She is normal weight. She is not ill-appearing or toxic-appearing.  HENT:     Head: Normocephalic and atraumatic.  Eyes:     General: No scleral icterus.       Right eye: No discharge.        Left eye: No discharge.     Extraocular Movements: Extraocular movements intact.     Conjunctiva/sclera: Conjunctivae normal.     Pupils: Pupils are equal, round, and reactive to light.  Cardiovascular:     Rate and Rhythm: Normal rate and regular rhythm.  Pulmonary:     Effort: Pulmonary effort is normal.     Breath sounds: Normal breath sounds.  Musculoskeletal:     Right lower leg: No edema.     Left lower leg: No edema.  Skin:    Findings: No rash.  Neurological:     Mental Status: She is alert and oriented to person, place, and time. Mental status is at baseline.  Psychiatric:        Mood and Affect: Mood normal.        Behavior: Behavior normal.        Thought Content: Thought content normal.        Judgment: Judgment normal.     No results found. No results found. No results found for this or any previous visit (from the past 24 hour(s)).  Assessment/Plan: Teresa Wong is Wong 47 y.o. female present for OV for  Immunity status testing - Hepatitis B surface  antibody,quantitative - Varicella zoster antibody, IgG - Measles/Mumps/Rubella Immunity  Tuberculosis screening - QuantiFERON-TB Gold Plus  Need for Tdap vaccination  Tdap updated today  Reviewed expectations re: course of current medical issues. Discussed self-management of symptoms. Outlined signs and symptoms indicating need for more acute intervention. Patient verbalized understanding and all questions were answered. Patient received an After-Visit Summary.    Orders Placed This Encounter  Procedures   Tdap vaccine greater than or equal to 7yo IM   QuantiFERON-TB Gold Plus   Hepatitis B surface antibody,quantitative   Measles/Mumps/Rubella Immunity   Varicella zoster antibody, IgG   No orders of the defined types were placed in this encounter.  Referral Orders  No referral(s) requested today     Note is dictated utilizing voice recognition software. Although note has been proof read prior to signing, occasional typographical errors still can be missed. If any questions arise, please do not hesitate to call for verification.   electronically signed by:  Felix Pacini, DO  [facility] - [place]

## 2022-11-07 LAB — VARICELLA ZOSTER ANTIBODY, IGG
Varicella IgG: <135 index — ABNORMAL LOW
Varicella IgG: <135 index — ABNORMAL LOW

## 2022-11-07 LAB — MEASLES/MUMPS/RUBELLA IMMUNITY
Mumps IgG: <9 AU/mL — ABNORMAL LOW
Mumps IgG: <9 AU/mL — ABNORMAL LOW
Rubella: 0.92 Index — ABNORMAL LOW
Rubeola IgG: 209 AU/mL

## 2022-11-08 ENCOUNTER — Telehealth: Payer: Self-pay | Admitting: Family Medicine

## 2022-11-08 LAB — QUANTIFERON-TB GOLD PLUS
Mitogen-NIL: >10 IU/mL
NIL: 0.06 IU/mL
QuantiFERON-TB Gold Plus: NEGATIVE
TB1-NIL: 0 IU/mL
TB2-NIL: <0 IU/mL

## 2022-11-08 LAB — HEPATITIS B SURFACE ANTIBODY, QUANTITATIVE: Hep B S AB Quant (Post): 136 m[IU]/mL (ref >=10)

## 2022-11-08 LAB — MEASLES/MUMPS/RUBELLA IMMUNITY
Rubella: <0.9 Index — ABNORMAL LOW
Rubeola IgG: 172 AU/mL

## 2022-11-08 NOTE — Telephone Encounter (Signed)
Please inform patient She has immunity to hepatitis B and measles (rubeola)  Unfortunately, she does not have immunity to varicella/chickenpox, rubella (german measles)  or mumps.  If her employer requires immunity to the above she will need to receive a booster of MMR and varicella vaccines. She can have these on the same day by nurse visit if desired.

## 2022-11-08 NOTE — Telephone Encounter (Signed)
Spoke with patient regarding results/recommendations.  

## 2022-11-09 ENCOUNTER — Encounter (HOSPITAL_BASED_OUTPATIENT_CLINIC_OR_DEPARTMENT_OTHER): Payer: Self-pay | Admitting: Medical

## 2022-11-09 ENCOUNTER — Ambulatory Visit (HOSPITAL_BASED_OUTPATIENT_CLINIC_OR_DEPARTMENT_OTHER): Payer: Managed Care, Other (non HMO) | Admitting: Medical

## 2022-11-09 ENCOUNTER — Ambulatory Visit (INDEPENDENT_AMBULATORY_CARE_PROVIDER_SITE_OTHER): Payer: Managed Care, Other (non HMO)

## 2022-11-09 ENCOUNTER — Other Ambulatory Visit (HOSPITAL_COMMUNITY)
Admission: RE | Admit: 2022-11-09 | Discharge: 2022-11-09 | Disposition: A | Discharge status: Home / Self Care | Payer: Managed Care, Other (non HMO) | Source: Ambulatory Visit | Attending: Medical | Admitting: Medical

## 2022-11-09 VITALS — BP 157/77 | HR 82 | Ht 67.5 in | Wt 217.0 lb

## 2022-11-09 DIAGNOSIS — R21 Rash and other nonspecific skin eruption: Secondary | ICD-10-CM | POA: Diagnosis not present

## 2022-11-09 DIAGNOSIS — Z23 Encounter for immunization: Secondary | ICD-10-CM

## 2022-11-09 DIAGNOSIS — Z01419 Encounter for gynecological examination (general) (routine) without abnormal findings: Secondary | ICD-10-CM | POA: Diagnosis not present

## 2022-11-09 DIAGNOSIS — Z124 Encounter for screening for malignant neoplasm of cervix: Secondary | ICD-10-CM | POA: Insufficient documentation

## 2022-11-09 NOTE — Addendum Note (Signed)
Addended by: Ina Homes B on: 11/09/2022 12:50 PM   Modules accepted: Orders

## 2022-11-09 NOTE — Progress Notes (Addendum)
History:  [name] is a 47 y.o. [id number] who presents to clinic today for annual exam with pap smear. Last pap smear was normal in 2015. She had an abnormal pap smear in 2005 with a LEEP. Pap smears have been normal since then. She had a normal mammogram 05/2022. She is sexually active with one long term female partner. She has had BTL and does not use condoms. She has regular periods lasting about 5 days per month. Flow is heavy, but manageable. LMP 10/30/2022. She denies abnormal discharge, bleeding, pain, GI concerns, breast concerns or UTI symptoms. She has noted a rash on her left hip since she started exercising more recently.   The following portions of the patient's history were reviewed and updated as appropriate: allergies, current medications, family history, past medical history, social history, past surgical history and problem list.  Review of Systems:  Review of Systems  Constitutional:  Negative for fever and malaise/fatigue.  Gastrointestinal:  Negative for abdominal pain, constipation, diarrhea, nausea and vomiting.  Genitourinary:  Negative for dysuria, frequency and urgency.       Neg - vaginal bleeding, discharge, pelvic pain  Skin:  Positive for rash.      Objective:  Physical Exam BP (!) 157/77 (BP Location: Left Arm, Patient Position: Sitting, Cuff Size: Large)   Pulse 82   Ht 5' 7.5" (1.715 m) Comment: Reported  Wt 217 lb (98.4 kg)   LMP 10/30/2022 (Approximate)   BMI 33.49 kg/m  Physical Exam Vitals and nursing note reviewed. Exam conducted with a chaperone present.  Constitutional:      General: She is not in acute distress.    Appearance: Normal appearance. She is well-developed. She is obese.  HENT:     Head: Normocephalic and atraumatic.  Cardiovascular:     Rate and Rhythm: Normal rate and regular rhythm.     Heart sounds: No murmur heard. Pulmonary:     Effort: Pulmonary effort is normal. No respiratory distress.     Breath sounds:  Normal breath sounds. No wheezing.  Chest:  Breasts:    Right: No swelling, bleeding, inverted nipple, mass, nipple discharge, skin change or tenderness.     Left: No swelling, bleeding, inverted nipple, mass, nipple discharge, skin change or tenderness.  Abdominal:     General: Abdomen is flat. Bowel sounds are normal. There is no distension.     Palpations: Abdomen is soft. There is no mass.     Tenderness: There is no abdominal tenderness. There is no guarding or rebound.  Genitourinary:    General: Normal vulva.     Vagina: Vaginal discharge (scant white) present. No erythema, tenderness or bleeding.     Cervix: No cervical motion tenderness, discharge, friability, lesion, erythema or cervical bleeding.     Uterus: Not enlarged and not tender.      Adnexa:        Right: No mass or tenderness.         Left: No mass or tenderness.    Musculoskeletal:     Cervical back: Neck supple.  Skin:    General: Skin is warm and dry.     Findings: No erythema.       Neurological:     Mental Status: She is alert and oriented to person, place, and time.  Psychiatric:        Mood and Affect: Mood normal.    Health Maintenance Due  Topic Date Due  . PAP SMEAR-Modifier  05/30/2022    Labs, imaging and previous visits in Epic and Care Everywhere reviewed  Assessment & Plan:  1. Women's annual routine gynecological examination - UTD on Mammogram   2. Cervical cancer screening - Pap smear today   3. Rash - Consistent with yeast, OTC recommendations given, let us know if worse or does not imrpove    Return in about 1 year (around 11/09/2023) for Annual exam.  Marny Lowenstein, PA-C 11/09/2022 8:49 AM

## 2022-11-14 LAB — CYTOLOGY - PAP
Comment: NEGATIVE
Diagnosis: NEGATIVE
High risk HPV: NEGATIVE

## 2023-04-18 ENCOUNTER — Other Ambulatory Visit (HOSPITAL_COMMUNITY): Payer: Self-pay

## 2023-04-24 ENCOUNTER — Ambulatory Visit: Payer: Managed Care, Other (non HMO) | Admitting: Family Medicine

## 2023-04-24 ENCOUNTER — Encounter: Payer: Self-pay | Admitting: Family Medicine

## 2023-04-24 VITALS — BP 137/88 | HR 85 | Temp 98.4°F | Wt 219.4 lb

## 2023-04-24 DIAGNOSIS — N611 Abscess of the breast and nipple: Secondary | ICD-10-CM | POA: Diagnosis not present

## 2023-04-24 DIAGNOSIS — Z23 Encounter for immunization: Secondary | ICD-10-CM | POA: Diagnosis not present

## 2023-04-24 MED ORDER — SULFAMETHOXAZOLE-TRIMETHOPRIM 800-160 MG PO TABS: 1.0000 | ORAL_TABLET | Freq: Two times a day (BID) | ORAL | 0 refills | Status: DC

## 2023-04-24 NOTE — Patient Instructions (Addendum)
Return in about 2 weeks (around 05/08/2023), or if symptoms worsen or fail to improve.        Great to see you today.  I have refilled the medication(s) we provide.   If labs were collected or images ordered, we will inform you of  results once we have received them and reviewed. We will contact you either by echart message, or telephone call.  Please give ample time to the testing facility, and our office to run,  receive and review results. Please do not call inquiring of results, even if you can see them in your chart. We will contact you as soon as we are able. If it has been over 1 week since the test was completed, and you have not yet heard from [place], then please call us.    - echart message- for normal results that have been seen by the patient already.   - telephone call: abnormal results or if patient has not viewed results in their echart.  If a referral to a specialist was entered for you, please call us in 2 weeks if you have not heard from the specialist office to schedule.

## 2023-04-24 NOTE — Progress Notes (Signed)
Teresa Wong , [birth year], 47 y.o., female MRN: [id number] Patient Care Team    Relationship Specialty Notifications Start End  Natalia Leatherwood, DO PCP - General Family Medicine  03/04/15   Asa Lente, MD Consulting Physician Neurology  09/28/17   Carrington Clamp, MD Consulting Physician Obstetrics and Gynecology  12/25/17     Chief Complaint  Patient presents with   Cyst    Noticed last week; little tenderness     Subjective: Teresa Wong is a 47 y.o. Pt presents for an OV with complaints of breast abscess of 7 days duration.  Associated symptoms include redness and swelling. Pt had a similar abscess last year 02/2022, which required I&D.       04/24/2023    7:48 AM 11/09/2022    8:19 AM 03/06/2022    3:57 PM 01/09/2021    9:28 AM 04/08/2019    2:41 PM  Depression screen PHQ 2/9  Decreased Interest 0 0 0 0 0  Down, Depressed, Hopeless 0 0 0 0 0  PHQ - 2 Score 0 0 0 0 0  Altered sleeping    1 0  Tired, decreased energy    0 1  Change in appetite    0 0  Feeling bad or failure about yourself     0 0  Trouble concentrating    0 1  Moving slowly or fidgety/restless    0 0  Suicidal thoughts    0 0  PHQ-9 Score    1 2  Difficult doing work/chores     Not difficult at all    No Known Allergies Social History   Social History Narrative   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 She is [employer] forces logistics/support/administrative officer.   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   Depression, recurrent (HCC) 12/25/2017   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  Heparin and folic acid   History of LEEP (loop electrosurgical excision procedure) of cervix complicating pregnancy 2006   Meningitis 1984   Numbness and tingling of both legs 05/21/2017   Postpartum depression    Pregnancy induced hypertension 2010, 2012   History - resolved - no  problems with current pregnancy 2015   Past Surgical History:  Procedure Laterality Date   CERVICAL BIOPSY  W/ LOOP ELECTRODE EXCISION  2006   CESAREAN SECTION N/A 11/26/2013   Procedure: CESAREAN SECTION;  Surgeon: Loney Laurence, MD;  Location: [facility];  Service: Obstetrics;  Laterality: N/A;   CHOLECYSTECTOMY     COLPOSCOPY  09/2004   DILATION AND CURETTAGE OF UTERUS  11/2011,08/2010,12/2007,08/2007   x4   DILATION AND EVACUATION  12/18/2011   Procedure: DILATATION AND EVACUATION;  Surgeon: Loney Laurence, MD;  Location: [facility];  Service: Gynecology;  Laterality: N/A;   LAPAROSCOPIC CHOLECYSTECTOMY SINGLE PORT N/A 09/28/2017   Procedure: LAPAROSCOPIC CHOLECYSTECTOMY SINGLE SITE WITH CHOLANGIOGRAM;  Surgeon: Karie Soda, MD;  Location: [facility];  Service: General;  Laterality: N/A;   TUBAL LIGATION Bilateral 11/26/2013   Procedure: BILATERAL TUBAL LIGATION;  Surgeon: Loney Laurence, MD;  Location: [facility];  Service: Obstetrics;  Laterality: Bilateral;   WISDOM TOOTH EXTRACTION     Family History  Problem Relation Age of Onset   Hypertension Mother  Hyperlipidemia Mother    CAD Father 34       Bypass surgery 1.5 yrs ago    Hyperlipidemia Father    Cancer Father        bladder   Bladder Cancer Father    Diverticulitis Father    Heart disease Maternal Grandfather    Colon cancer Maternal Grandmother 28   Miscarriages / Stillbirths Paternal Grandmother    Allergies as of 04/24/2023   No Known Allergies      Medication List        Accurate as of April 24, 2023  8:07 AM. If you have any questions, ask your nurse or doctor.          cyanocobalamin 1000 MCG tablet Take 1,000 mcg by mouth daily.   multivitamin tablet Take 1 tablet by mouth daily.   sulfamethoxazole-trimethoprim 800-160 MG tablet Commonly known as: BACTRIM DS Take 1 tablet by mouth 2 (two) times daily. Started by: Felix Pacini   Vitamin D 125 MCG (5000 UT) Caps Take 1 capsule by mouth  daily.        All past medical history, surgical history, allergies, family history, immunizations andmedications were updated in the EMR today and reviewed under the history and medication portions of their EMR.     ROS Negative, with the exception of above mentioned in HPI   Objective:  BP 137/88   Pulse 85   Temp 98.4 F (36.9 C)   Wt 219 lb 6.4 oz (99.5 kg)   SpO2 96%   BMI 33.86 kg/m  Body mass index is 33.86 kg/m. Physical Exam Vitals and nursing note reviewed.  Constitutional:      General: She is not in acute distress.    Appearance: Normal appearance. She is normal weight. She is not ill-appearing or toxic-appearing.  HENT:     Head: Normocephalic and atraumatic.  Eyes:     General: No scleral icterus.       Right eye: No discharge.        Left eye: No discharge.     Extraocular Movements: Extraocular movements intact.     Conjunctiva/sclera: Conjunctivae normal.     Pupils: Pupils are equal, round, and reactive to light.  Skin:    Findings: No rash.     Comments: Rt. Medial breast with erythema 1.5x2 cm, small amount of fluctuance. Non tender.   Neurological:     Mental Status: She is alert and oriented to person, place, and time. Mental status is at baseline.     Motor: No weakness.     Coordination: Coordination normal.     Gait: Gait normal.  Psychiatric:        Mood and Affect: Mood normal.        Behavior: Behavior normal.        Thought Content: Thought content normal.        Judgment: Judgment normal.     No results found. No results found. No results found for this or any previous visit (from the past 24 hour(s)).  Assessment/Plan: Teresa Wong is a 47 y.o. female present for OV for  Breast abscess 1.5.x2 cm rt breast abscess, just above last breast abscess.  Will try bactrin DS BID x 7days Gentle cleansing w/ Hibiclins daily F/u in 2 weeks if not resolved  Influenza vaccine needed - Flu vaccine trivalent PF, 6mos and  older(Flulaval,Afluria,Fluarix,Fluzone)  Reviewed expectations re: course of current medical issues. Discussed self-management of symptoms. Outlined signs and symptoms indicating  need for more acute intervention. Patient verbalized understanding and all questions were answered. Patient received an After-Visit Summary.    Orders Placed This Encounter  Procedures   Flu vaccine trivalent PF, 6mos and older(Flulaval,Afluria,Fluarix,Fluzone)   Meds ordered this encounter  Medications   sulfamethoxazole-trimethoprim (BACTRIM DS) 800-160 MG tablet    Sig: Take 1 tablet by mouth 2 (two) times daily.    Dispense:  14 tablet    Refill:  0   Referral Orders  No referral(s) requested today     Note is dictated utilizing voice recognition software. Although note has been proof read prior to signing, occasional typographical errors still can be missed. If any questions arise, please do not hesitate to call for verification.   electronically signed by:  Felix Pacini, DO  [facility] - [place]

## 2023-05-02 ENCOUNTER — Encounter: Payer: Self-pay | Admitting: Family Medicine

## 2023-05-07 ENCOUNTER — Ambulatory Visit: Payer: Managed Care, Other (non HMO) | Admitting: Family Medicine

## 2023-05-07 ENCOUNTER — Encounter: Payer: Self-pay | Admitting: Family Medicine

## 2023-05-07 VITALS — BP 118/80 | HR 90 | Temp 98.2°F | Wt 213.8 lb

## 2023-05-07 DIAGNOSIS — N611 Abscess of the breast and nipple: Secondary | ICD-10-CM | POA: Diagnosis not present

## 2023-05-07 MED ORDER — DOXYCYCLINE HYCLATE 100 MG PO TABS
100.0000 mg | ORAL_TABLET | Freq: Two times a day (BID) | ORAL | 0 refills | Status: DC
Start: 2023-05-07 — End: 2023-09-17

## 2023-05-07 NOTE — Progress Notes (Signed)
Teresa Wong , 1975-10-23, 47 y.o., female MRN: [id number] Patient Care Team    Relationship Specialty Notifications Start End  Natalia Leatherwood, DO PCP - General Family Medicine  03/04/15   Asa Lente, MD Consulting Physician Neurology  09/28/17   Carrington Clamp, MD Consulting Physician Obstetrics and Gynecology  12/25/17     Chief Complaint  Patient presents with   Cyst    Right side of chest; last dose of abx on 10/02     Subjective: Teresa Wong is a 47 y.o. Pt presents for an OV with complaints of breast abscess   present since 9/18. Treated with bactrim DS x7days. She reports the redness went away, but the abscess did not. This location is a reoccurrence from prior.  Pt had a similar abscess last year 02/2022, which required I&D. Different site.      04/24/2023    7:48 AM 11/09/2022    8:19 AM 03/06/2022    3:57 PM 01/09/2021    9:28 AM 04/08/2019    2:41 PM  Depression screen PHQ 2/9  Decreased Interest 0 0 0 0 0  Down, Depressed, Hopeless 0 0 0 0 0  PHQ - 2 Score 0 0 0 0 0  Altered sleeping    1 0  Tired, decreased energy    0 1  Change in appetite    0 0  Feeling bad or failure about yourself     0 0  Trouble concentrating    0 1  Moving slowly or fidgety/restless    0 0  Suicidal thoughts    0 0  PHQ-9 Score    1 2  Difficult doing work/chores     Not difficult at all    No Known Allergies Social History   Social History Narrative   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 She is [employer] logistics/support/administrative officer.   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   Depression, recurrent (HCC) 12/25/2017   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  Heparin and folic acid   History of LEEP (loop electrosurgical excision procedure) of cervix complicating pregnancy 2006   Meningitis 1984   Numbness and tingling of both  legs 05/21/2017   Postpartum depression    Pregnancy induced hypertension 2010, 2012   History - resolved - no problems with current pregnancy 2015   Past Surgical History:  Procedure Laterality Date   CERVICAL BIOPSY  W/ LOOP ELECTRODE EXCISION  2006   CESAREAN SECTION N/A 11/26/2013   Procedure: CESAREAN SECTION;  Surgeon: Loney Laurence, MD;  Location: [facility];  Service: Obstetrics;  Laterality: N/A;   CHOLECYSTECTOMY     COLPOSCOPY  09/2004   DILATION AND CURETTAGE OF UTERUS  11/2011,08/2010,12/2007,08/2007   x4   DILATION AND EVACUATION  12/18/2011   Procedure: DILATATION AND EVACUATION;  Surgeon: Loney Laurence, MD;  Location: [facility];  Service: Gynecology;  Laterality: N/A;   LAPAROSCOPIC CHOLECYSTECTOMY SINGLE PORT N/A 09/28/2017   Procedure: LAPAROSCOPIC CHOLECYSTECTOMY SINGLE SITE WITH CHOLANGIOGRAM;  Surgeon: Karie Soda, MD;  Location: [facility];  Service: General;  Laterality: N/A;   TUBAL LIGATION Bilateral 11/26/2013   Procedure: BILATERAL TUBAL LIGATION;  Surgeon: Loney Laurence, MD;  Location: [facility];  Service: Obstetrics;  Laterality: Bilateral;  WISDOM TOOTH EXTRACTION     Family History  Problem Relation Age of Onset   Hypertension Mother    Hyperlipidemia Mother    CAD Father 37       Bypass surgery 1.5 yrs ago    Hyperlipidemia Father    Cancer Father        bladder   Bladder Cancer Father    Diverticulitis Father    Heart disease Maternal Grandfather    Colon cancer Maternal Grandmother 22   Miscarriages / Stillbirths Paternal Grandmother    Allergies as of 05/07/2023   No Known Allergies      Medication List        Accurate as of May 07, 2023 11:04 AM. If you have any questions, ask your nurse or doctor.          STOP taking these medications    sulfamethoxazole-trimethoprim 800-160 MG tablet Commonly known as: BACTRIM DS Stopped by: Teresa Wong       TAKE these medications    cyanocobalamin 1000 MCG tablet Take 1,000 mcg by  mouth daily.   multivitamin tablet Take 1 tablet by mouth daily.   Vitamin D 125 MCG (5000 UT) Caps Take 1 capsule by mouth daily.        All past medical history, surgical history, allergies, family history, immunizations andmedications were updated in the EMR today and reviewed under the history and medication portions of their EMR.     ROS Negative, with the exception of above mentioned in HPI   Objective:  BP 118/80   Pulse 90   Temp 98.2 F (36.8 C)   Wt 213 lb 12.8 oz (97 kg)   LMP 05/06/2023   SpO2 96%   BMI 32.99 kg/m  Body mass index is 32.99 kg/m. Physical Exam Vitals and nursing note reviewed.  Constitutional:      General: She is not in acute distress.    Appearance: Normal appearance. She is normal weight. She is not ill-appearing or toxic-appearing.  HENT:     Head: Normocephalic and atraumatic.  Eyes:     General: No scleral icterus.       Right eye: No discharge.        Left eye: No discharge.     Extraocular Movements: Extraocular movements intact.     Conjunctiva/sclera: Conjunctivae normal.     Pupils: Pupils are equal, round, and reactive to light.  Skin:    Findings: No rash.     Comments: Rt. Medial breast abscess 2.25x2 cm, small amount of fluctuance. Non tender. No erythema.   Neurological:     Mental Status: She is alert and oriented to person, place, and time. Mental status is at baseline.     Motor: No weakness.     Coordination: Coordination normal.     Gait: Gait normal.  Psychiatric:        Mood and Affect: Mood normal.        Behavior: Behavior normal.        Thought Content: Thought content normal.        Judgment: Judgment normal.     No results found. No results found. No results found for this or any previous visit (from the past 24 hour(s)).  Assessment/Plan: Teresa Wong is a 47 y.o. female present for OV for  Breast abscess Recurrence of abscess at this site. Currently it is not red or tender, but it is a  little larger today.  Discussed referral for full excision  of abscess, instead of I&D at same site. She is agreeable to this today.  Provided her with a script of doxy bid in the event redness or tenderness occurs for her to start. Referral to plastic surgery placed today given location of abscess.   Reviewed expectations re: course of current medical issues. Discussed self-management of symptoms. Outlined signs and symptoms indicating need for more acute intervention. Patient verbalized understanding and all questions were answered. Patient received an After-Visit Summary.    No orders of the defined types were placed in this encounter.  No orders of the defined types were placed in this encounter.  Referral Orders  No referral(s) requested today     Note is dictated utilizing voice recognition software. Although note has been proof read prior to signing, occasional typographical errors still can be missed. If any questions arise, please do not hesitate to call for verification.   electronically signed by:  Teresa Pacini, DO  [facility] - [place]

## 2023-05-07 NOTE — Patient Instructions (Signed)

## 2023-05-08 ENCOUNTER — Encounter: Payer: Self-pay | Admitting: Family Medicine

## 2023-06-25 ENCOUNTER — Ambulatory Visit: Payer: Managed Care, Other (non HMO) | Admitting: Plastic Surgery

## 2023-06-25 ENCOUNTER — Encounter: Payer: Self-pay | Admitting: Plastic Surgery

## 2023-06-25 VITALS — BP 130/85 | HR 91 | Ht 68.0 in | Wt 220.2 lb

## 2023-06-25 DIAGNOSIS — L723 Sebaceous cyst: Secondary | ICD-10-CM | POA: Diagnosis not present

## 2023-06-25 NOTE — Progress Notes (Signed)
Patient ID: Teresa Wong, female    DOB: [birth year], 47 y.o.   MRN: [id number]   Chief Complaint  Patient presents with   Consult         The patient is a 47 year old female here for evaluation of an area on the medial aspect of her right breast.  The patient states she has had a cyst in that area on and off since 2018.  She often has to take antibiotics to keep it under control.  It has drained in the past.  It has been lanced in the past by her primary care doc.  Today it is fairly irritated and she is on antibiotics.  Its about 2.5 cm in size.  Her past medical history and surgical history is listed below.  She does not have diabetes.    Review of Systems  Constitutional: Negative.   HENT: Negative.    Eyes: Negative.   Respiratory: Negative.    Cardiovascular: Negative.   Gastrointestinal: Negative.   Endocrine: Negative.   Genitourinary: Negative.   Musculoskeletal: Negative.     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   Depression, recurrent (HCC) 12/25/2017   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  Heparin and folic acid   History of LEEP (loop electrosurgical excision procedure) of cervix complicating pregnancy 2006   Meningitis 1984   Numbness and tingling of both legs 05/21/2017   Postpartum depression    Pregnancy induced hypertension 2010, 2012   History - resolved - no problems with current pregnancy 2015    Past Surgical History:  Procedure Laterality Date   CERVICAL BIOPSY  W/ LOOP ELECTRODE EXCISION  2006   CESAREAN SECTION N/A 11/26/2013   Procedure: CESAREAN SECTION;  Surgeon: Loney Laurence, MD;  Location: [facility];  Service: Obstetrics;  Laterality: N/A;   CHOLECYSTECTOMY     COLPOSCOPY  09/2004   DILATION AND CURETTAGE OF UTERUS  11/2011,08/2010,12/2007,08/2007   x4   DILATION AND EVACUATION   12/18/2011   Procedure: DILATATION AND EVACUATION;  Surgeon: Loney Laurence, MD;  Location: [facility];  Service: Gynecology;  Laterality: N/A;   LAPAROSCOPIC CHOLECYSTECTOMY SINGLE PORT N/A 09/28/2017   Procedure: LAPAROSCOPIC CHOLECYSTECTOMY SINGLE SITE WITH CHOLANGIOGRAM;  Surgeon: Karie Soda, MD;  Location: [facility];  Service: General;  Laterality: N/A;   TUBAL LIGATION Bilateral 11/26/2013   Procedure: BILATERAL TUBAL LIGATION;  Surgeon: Loney Laurence, MD;  Location: [facility];  Service: Obstetrics;  Laterality: Bilateral;   WISDOM TOOTH EXTRACTION        Current Outpatient Medications:    Cholecalciferol (VITAMIN D) 125 MCG (5000 UT) CAPS, Take 1 capsule by mouth daily., Disp: , Rfl:    cyanocobalamin 1000 MCG tablet, Take 1,000 mcg by mouth daily. , Disp: , Rfl:    Multiple Vitamin (MULTIVITAMIN) tablet, Take 1 tablet by mouth daily., Disp: , Rfl:    doxycycline (VIBRA-TABS) 100 MG tablet, Take 1 tablet (100 mg total) by mouth 2 (two) times daily., Disp: 14 tablet, Rfl: 0   Objective:   Vitals:   06/25/23 0842  BP: 130/85  Pulse: 91  SpO2: 97%    Physical Exam Vitals reviewed.  HENT:     Head: Normocephalic and atraumatic.  Cardiovascular:     Rate and Rhythm: Normal rate.  Pulmonary:  Effort: Pulmonary effort is normal.  Chest:    Musculoskeletal:        General: Swelling and tenderness present.  Skin:    General: Skin is warm.     Capillary Refill: Capillary refill takes less than 2 seconds.     Coloration: Skin is not jaundiced.     Findings: Erythema and lesion present.  Neurological:     Mental Status: She is alert and oriented to person, place, and time.  Psychiatric:        Mood and Affect: Mood normal.        Behavior: Behavior normal.     Assessment & Plan:  Sebaceous cyst  Plan for excision of the area which is likely a sebaceous cyst.  Patient knows if it flares up we may have to postpone the excision so not to spread it.  She will keep in  touch with [place] in the meantime we will get her scheduled.   Pictures were obtained of the patient and placed in the chart with the patient's or guardian's permission.   Alena Bills Pantera [place], DO

## 2023-09-10 ENCOUNTER — Encounter: Payer: Self-pay | Admitting: Plastic Surgery

## 2023-09-10 ENCOUNTER — Ambulatory Visit: Payer: Managed Care, Other (non HMO) | Admitting: Plastic Surgery

## 2023-09-10 VITALS — BP 147/86 | HR 85 | Ht 68.0 in | Wt 226.8 lb

## 2023-09-10 DIAGNOSIS — L723 Sebaceous cyst: Secondary | ICD-10-CM | POA: Diagnosis not present

## 2023-09-10 NOTE — Progress Notes (Addendum)
Procedure Note  Preoperative Dx: Sebaceous cyst of chest  Postoperative Dx: Same  Procedure: Excision of sebaceous cyst of chest 1.5 cm  Anesthesia: Lidocaine 1% with 1:100,000 epinephrine  Description of Procedure: Risks and complications were explained to the patient.  Consent was confirmed and the patient understands the risks and benefits.  The potential complications and alternatives were explained and the patient consents.  The patient expressed understanding the option of not having the procedure and the risks of a scar.  Time out was called and all information was confirmed to be correct.    The area was prepped and drapped.  Lidocaine 1% with epinephrine was injected in the subcutaneous area.  After waiting several minutes for the local to take affect a #15 blade was used to incise the skin over the lesion. The blade and tissue scissors were used to excise the entire cyst sac. The skin edges were reapproximated with 6-0 Monocryl.  A dressing was applied.  The patient was given instructions on how to care for the area and a follow up appointment.  Teresa Wong tolerated the procedure well and there were no complications.

## 2023-09-11 ENCOUNTER — Telehealth: Payer: Self-pay

## 2023-09-11 NOTE — Telephone Encounter (Signed)
Called patient to check in one day post procedure. Patient states she is doing well and I instructed her to call or send a MyChart message if she has any questions or concerns.

## 2023-09-17 ENCOUNTER — Ambulatory Visit: Payer: Managed Care, Other (non HMO) | Admitting: Student

## 2023-09-17 VITALS — BP 125/85 | HR 78

## 2023-09-17 DIAGNOSIS — L723 Sebaceous cyst: Secondary | ICD-10-CM

## 2023-09-17 NOTE — Progress Notes (Signed)
Patient is a 48 year old female who underwent excision of sebaceous cyst of the chest with Dr. Ulice Bold on 09/10/2023.  Patient is 10 days out from her procedure.  She presents to the clinic today for postprocedural follow-up.  Per chart review, it does not appear that lesion was sent to pathology.  Today, patient reports she is doing well.  She denies any issues or concerns since her procedure.  She denies any drainage, fevers or chills.  Chaperone present on exam.  On exam, patient sitting upright in no acute distress.  Incision appears to be intact and well-healed.  There is a little bit of surrounding irritation.  There is no fluctuance noted.  There is no tenderness to palpation.  No swelling noted.  No active drainage on exam.  There is a Monocryl suture that was noted.  This was cut and removed.  Patient tolerated well.  Discussed with patient that I would like her to apply Vaseline to her incision daily for the next 1 to 2 weeks.  Discussed with her that after that, she may transition to scar creams.  We discussed different scar therapy such as Mederma, silicone based scar creams or silicone tapes.  Also discussed with her that she should avoid direct sunlight to the incision as this can worsen the scar.  Patient expressed understanding.  Patient to follow-up as needed.  Instructed the patient to call if she has any questions or concerns about anything.
